# Patient Record
Sex: Female | Born: 1940 | Race: Black or African American | Hispanic: No | Marital: Single | Smoking: Former smoker
Health system: Southern US, Community
[De-identification: ages and names within clinical notes are randomized; demographics above are authoritative.]

## PROBLEM LIST (undated history)

## (undated) DIAGNOSIS — K259 Gastric ulcer, unspecified as acute or chronic, without hemorrhage or perforation: Secondary | ICD-10-CM

## (undated) DIAGNOSIS — J45909 Unspecified asthma, uncomplicated: Secondary | ICD-10-CM

## (undated) DIAGNOSIS — E119 Type 2 diabetes mellitus without complications: Secondary | ICD-10-CM

## (undated) DIAGNOSIS — I10 Essential (primary) hypertension: Secondary | ICD-10-CM

## (undated) DIAGNOSIS — J449 Chronic obstructive pulmonary disease, unspecified: Secondary | ICD-10-CM

## (undated) HISTORY — DX: Essential (primary) hypertension: I10

## (undated) HISTORY — DX: Unspecified asthma, uncomplicated: J45.909

## (undated) HISTORY — DX: Type 2 diabetes mellitus without complications: E11.9

## (undated) HISTORY — DX: Gastric ulcer, unspecified as acute or chronic, without hemorrhage or perforation: K25.9

## (undated) HISTORY — DX: Chronic obstructive pulmonary disease, unspecified: J44.9

---

## 1996-11-30 DIAGNOSIS — I10 Essential (primary) hypertension: Secondary | ICD-10-CM | POA: Insufficient documentation

## 1996-11-30 DIAGNOSIS — J45909 Unspecified asthma, uncomplicated: Secondary | ICD-10-CM | POA: Insufficient documentation

## 2008-11-07 DIAGNOSIS — E119 Type 2 diabetes mellitus without complications: Secondary | ICD-10-CM | POA: Insufficient documentation

## 2010-01-29 DIAGNOSIS — I872 Venous insufficiency (chronic) (peripheral): Secondary | ICD-10-CM | POA: Insufficient documentation

## 2010-11-25 DIAGNOSIS — J9610 Chronic respiratory failure, unspecified whether with hypoxia or hypercapnia: Secondary | ICD-10-CM | POA: Insufficient documentation

## 2011-11-24 ENCOUNTER — Emergency Department: Payer: Self-pay | Admitting: Emergency Medicine

## 2011-11-24 LAB — COMPREHENSIVE METABOLIC PANEL
Albumin: 3.6 g/dL (ref 3.4–5.0)
Alkaline Phosphatase: 49 U/L — ABNORMAL LOW (ref 50–136)
Anion Gap: 5 — ABNORMAL LOW (ref 7–16)
BUN: 14 mg/dL (ref 7–18)
Bilirubin,Total: 0.4 mg/dL (ref 0.2–1.0)
Calcium, Total: 9.3 mg/dL (ref 8.5–10.1)
Chloride: 102 mmol/L (ref 98–107)
Co2: 29 mmol/L (ref 21–32)
Creatinine: 1.35 mg/dL — ABNORMAL HIGH (ref 0.60–1.30)
EGFR (African American): 46 — ABNORMAL LOW
EGFR (Non-African Amer.): 40 — ABNORMAL LOW
Glucose: 185 mg/dL — ABNORMAL HIGH (ref 65–99)
Osmolality: 277 (ref 275–301)
Potassium: 4 mmol/L (ref 3.5–5.1)
SGOT(AST): 31 U/L (ref 15–37)
SGPT (ALT): 21 U/L (ref 12–78)
Sodium: 136 mmol/L (ref 136–145)
Total Protein: 7.3 g/dL (ref 6.4–8.2)

## 2011-11-24 LAB — CBC WITH DIFFERENTIAL/PLATELET
Basophil #: 0.1 10*3/uL (ref 0.0–0.1)
Basophil %: 1.4 %
Eosinophil #: 0.1 10*3/uL (ref 0.0–0.7)
Eosinophil %: 2.4 %
HCT: 37.1 % (ref 35.0–47.0)
HGB: 12.2 g/dL (ref 12.0–16.0)
Lymphocyte #: 1.5 10*3/uL (ref 1.0–3.6)
Lymphocyte %: 26.2 %
MCH: 27.4 pg (ref 26.0–34.0)
MCHC: 32.7 g/dL (ref 32.0–36.0)
MCV: 84 fL (ref 80–100)
Monocyte #: 0.5 x10 3/mm (ref 0.2–0.9)
Monocyte %: 8.1 %
Neutrophil #: 3.5 10*3/uL (ref 1.4–6.5)
Neutrophil %: 61.9 %
Platelet: 298 10*3/uL (ref 150–440)
RBC: 4.45 10*6/uL (ref 3.80–5.20)
RDW: 14.8 % — ABNORMAL HIGH (ref 11.5–14.5)
WBC: 5.7 10*3/uL (ref 3.6–11.0)

## 2011-11-24 LAB — PRO B NATRIURETIC PEPTIDE: B-Type Natriuretic Peptide: 18 pg/mL (ref 0–125)

## 2011-11-24 LAB — TROPONIN I: Troponin-I: <0.02 ng/mL

## 2012-05-04 DIAGNOSIS — K219 Gastro-esophageal reflux disease without esophagitis: Secondary | ICD-10-CM | POA: Insufficient documentation

## 2012-07-05 DIAGNOSIS — J441 Chronic obstructive pulmonary disease with (acute) exacerbation: Secondary | ICD-10-CM | POA: Insufficient documentation

## 2012-07-05 DIAGNOSIS — J449 Chronic obstructive pulmonary disease, unspecified: Secondary | ICD-10-CM | POA: Insufficient documentation

## 2012-10-14 ENCOUNTER — Inpatient Hospital Stay: Payer: Self-pay | Admitting: Internal Medicine

## 2012-10-14 LAB — BASIC METABOLIC PANEL
Anion Gap: 9 (ref 7–16)
BUN: 32 mg/dL — ABNORMAL HIGH (ref 7–18)
Calcium, Total: 8.9 mg/dL (ref 8.5–10.1)
Chloride: 96 mmol/L — ABNORMAL LOW (ref 98–107)
Co2: 21 mmol/L (ref 21–32)
Creatinine: 3.44 mg/dL — ABNORMAL HIGH (ref 0.60–1.30)
EGFR (African American): 15 — ABNORMAL LOW
EGFR (Non-African Amer.): 13 — ABNORMAL LOW
Glucose: 123 mg/dL — ABNORMAL HIGH (ref 65–99)
Osmolality: 262 (ref 275–301)
Potassium: 2.9 mmol/L — ABNORMAL LOW (ref 3.5–5.1)
Sodium: 126 mmol/L — ABNORMAL LOW (ref 136–145)

## 2012-10-14 LAB — URINALYSIS, COMPLETE
Bilirubin,UR: NEGATIVE
Glucose,UR: NEGATIVE mg/dL (ref 0–75)
Hyaline Cast: 8
Ketone: NEGATIVE
Leukocyte Esterase: NEGATIVE
Nitrite: NEGATIVE
Ph: 5 (ref 4.5–8.0)
Protein: 30
RBC,UR: 2 /HPF (ref 0–5)
Specific Gravity: 1.016 (ref 1.003–1.030)
Squamous Epithelial: 17
WBC UR: 8 /HPF (ref 0–5)

## 2012-10-14 LAB — CLOSTRIDIUM DIFFICILE BY PCR

## 2012-10-14 LAB — COMPREHENSIVE METABOLIC PANEL
Albumin: 3.6 g/dL (ref 3.4–5.0)
Alkaline Phosphatase: 53 U/L (ref 50–136)
Anion Gap: 7 (ref 7–16)
BUN: 30 mg/dL — ABNORMAL HIGH (ref 7–18)
Bilirubin,Total: 0.7 mg/dL (ref 0.2–1.0)
Calcium, Total: 9.3 mg/dL (ref 8.5–10.1)
Chloride: 91 mmol/L — ABNORMAL LOW (ref 98–107)
Co2: 25 mmol/L (ref 21–32)
Creatinine: 3.44 mg/dL — ABNORMAL HIGH (ref 0.60–1.30)
EGFR (African American): 15 — ABNORMAL LOW
EGFR (Non-African Amer.): 13 — ABNORMAL LOW
Glucose: 135 mg/dL — ABNORMAL HIGH (ref 65–99)
Osmolality: 256 (ref 275–301)
Potassium: 2.7 mmol/L — ABNORMAL LOW (ref 3.5–5.1)
SGOT(AST): 77 U/L — ABNORMAL HIGH (ref 15–37)
SGPT (ALT): 35 U/L (ref 12–78)
Sodium: 123 mmol/L — ABNORMAL LOW (ref 136–145)
Total Protein: 8.1 g/dL (ref 6.4–8.2)

## 2012-10-14 LAB — CBC
HCT: 42 % (ref 35.0–47.0)
HGB: 14.2 g/dL (ref 12.0–16.0)
MCH: 27.1 pg (ref 26.0–34.0)
MCHC: 33.8 g/dL (ref 32.0–36.0)
MCV: 80 fL (ref 80–100)
Platelet: 249 10*3/uL (ref 150–440)
RBC: 5.22 10*6/uL — ABNORMAL HIGH (ref 3.80–5.20)
RDW: 14.5 % (ref 11.5–14.5)
WBC: 9.6 10*3/uL (ref 3.6–11.0)

## 2012-10-14 LAB — MAGNESIUM: Magnesium: 1.5 mg/dL — ABNORMAL LOW

## 2012-10-14 LAB — LIPASE, BLOOD: Lipase: 185 U/L (ref 73–393)

## 2012-10-14 LAB — TROPONIN I: Troponin-I: <0.02 ng/mL

## 2012-10-14 LAB — OCCULT BLOOD X 1 CARD TO LAB, STOOL: Occult Blood, Feces: POSITIVE

## 2012-10-15 LAB — BASIC METABOLIC PANEL
Anion Gap: 8 (ref 7–16)
BUN: 33 mg/dL — ABNORMAL HIGH (ref 7–18)
Calcium, Total: 8.4 mg/dL — ABNORMAL LOW (ref 8.5–10.1)
Chloride: 98 mmol/L (ref 98–107)
Co2: 22 mmol/L (ref 21–32)
Creatinine: 3.26 mg/dL — ABNORMAL HIGH (ref 0.60–1.30)
EGFR (African American): 16 — ABNORMAL LOW
EGFR (Non-African Amer.): 14 — ABNORMAL LOW
Glucose: 103 mg/dL — ABNORMAL HIGH (ref 65–99)
Osmolality: 265 (ref 275–301)
Potassium: 3 mmol/L — ABNORMAL LOW (ref 3.5–5.1)
Sodium: 128 mmol/L — ABNORMAL LOW (ref 136–145)

## 2012-10-15 LAB — CBC WITH DIFFERENTIAL/PLATELET
Basophil #: 0 10*3/uL (ref 0.0–0.1)
Basophil %: 0.2 %
Eosinophil #: 0 10*3/uL (ref 0.0–0.7)
Eosinophil %: 0.1 %
HCT: 37.5 % (ref 35.0–47.0)
HGB: 12.7 g/dL (ref 12.0–16.0)
Lymphocyte #: 0.8 10*3/uL — ABNORMAL LOW (ref 1.0–3.6)
Lymphocyte %: 9.8 %
MCH: 27 pg (ref 26.0–34.0)
MCHC: 34 g/dL (ref 32.0–36.0)
MCV: 79 fL — ABNORMAL LOW (ref 80–100)
Monocyte #: 0.6 x10 3/mm (ref 0.2–0.9)
Monocyte %: 7.1 %
Neutrophil #: 6.8 10*3/uL — ABNORMAL HIGH (ref 1.4–6.5)
Neutrophil %: 82.8 %
Platelet: 236 10*3/uL (ref 150–440)
RBC: 4.72 10*6/uL (ref 3.80–5.20)
RDW: 14.4 % (ref 11.5–14.5)
WBC: 8.2 10*3/uL (ref 3.6–11.0)

## 2012-10-15 LAB — MAGNESIUM: Magnesium: 2.2 mg/dL

## 2012-10-16 LAB — CBC WITH DIFFERENTIAL/PLATELET
Basophil #: 0 10*3/uL (ref 0.0–0.1)
Basophil %: 0.7 %
Eosinophil #: 0 10*3/uL (ref 0.0–0.7)
Eosinophil %: 0.9 %
HCT: 31.6 % — ABNORMAL LOW (ref 35.0–47.0)
HGB: 11 g/dL — ABNORMAL LOW (ref 12.0–16.0)
Lymphocyte #: 0.9 10*3/uL — ABNORMAL LOW (ref 1.0–3.6)
Lymphocyte %: 18.6 %
MCH: 27.7 pg (ref 26.0–34.0)
MCHC: 34.9 g/dL (ref 32.0–36.0)
MCV: 79 fL — ABNORMAL LOW (ref 80–100)
Monocyte #: 0.7 x10 3/mm (ref 0.2–0.9)
Monocyte %: 13.1 %
Neutrophil #: 3.4 10*3/uL (ref 1.4–6.5)
Neutrophil %: 66.7 %
Platelet: 236 10*3/uL (ref 150–440)
RBC: 3.99 10*6/uL (ref 3.80–5.20)
RDW: 14.5 % (ref 11.5–14.5)
WBC: 5.1 10*3/uL (ref 3.6–11.0)

## 2012-10-16 LAB — BASIC METABOLIC PANEL
Anion Gap: 7 (ref 7–16)
BUN: 20 mg/dL — ABNORMAL HIGH (ref 7–18)
Calcium, Total: 7.8 mg/dL — ABNORMAL LOW (ref 8.5–10.1)
Chloride: 101 mmol/L (ref 98–107)
Co2: 25 mmol/L (ref 21–32)
Creatinine: 2.09 mg/dL — ABNORMAL HIGH (ref 0.60–1.30)
EGFR (African American): 27 — ABNORMAL LOW
EGFR (Non-African Amer.): 23 — ABNORMAL LOW
Glucose: 96 mg/dL (ref 65–99)
Osmolality: 269 (ref 275–301)
Potassium: 3 mmol/L — ABNORMAL LOW (ref 3.5–5.1)
Sodium: 133 mmol/L — ABNORMAL LOW (ref 136–145)

## 2012-10-16 LAB — WBCS, STOOL

## 2012-10-16 LAB — URINE CULTURE

## 2012-10-17 LAB — CBC WITH DIFFERENTIAL/PLATELET
Basophil #: 0 10*3/uL (ref 0.0–0.1)
Basophil %: 0.8 %
Eosinophil #: 0.1 10*3/uL (ref 0.0–0.7)
Eosinophil %: 1.7 %
HCT: 32.3 % — ABNORMAL LOW (ref 35.0–47.0)
HGB: 11 g/dL — ABNORMAL LOW (ref 12.0–16.0)
Lymphocyte #: 1.3 10*3/uL (ref 1.0–3.6)
Lymphocyte %: 30 %
MCH: 27 pg (ref 26.0–34.0)
MCHC: 34.1 g/dL (ref 32.0–36.0)
MCV: 79 fL — ABNORMAL LOW (ref 80–100)
Monocyte #: 0.7 x10 3/mm (ref 0.2–0.9)
Monocyte %: 14.7 %
Neutrophil #: 2.4 10*3/uL (ref 1.4–6.5)
Neutrophil %: 52.8 %
Platelet: 272 10*3/uL (ref 150–440)
RBC: 4.08 10*6/uL (ref 3.80–5.20)
RDW: 15 % — ABNORMAL HIGH (ref 11.5–14.5)
WBC: 4.5 10*3/uL (ref 3.6–11.0)

## 2012-10-17 LAB — BASIC METABOLIC PANEL
Anion Gap: 5 — ABNORMAL LOW (ref 7–16)
BUN: 13 mg/dL (ref 7–18)
Calcium, Total: 7.9 mg/dL — ABNORMAL LOW (ref 8.5–10.1)
Chloride: 105 mmol/L (ref 98–107)
Co2: 26 mmol/L (ref 21–32)
Creatinine: 1.57 mg/dL — ABNORMAL HIGH (ref 0.60–1.30)
EGFR (African American): 38 — ABNORMAL LOW
EGFR (Non-African Amer.): 33 — ABNORMAL LOW
Glucose: 101 mg/dL — ABNORMAL HIGH (ref 65–99)
Osmolality: 272 (ref 275–301)
Potassium: 3.6 mmol/L (ref 3.5–5.1)
Sodium: 136 mmol/L (ref 136–145)

## 2012-10-21 LAB — CULTURE, BLOOD (SINGLE)

## 2012-10-22 LAB — CULTURE, BLOOD (SINGLE)

## 2012-10-29 LAB — CULTURE, BLOOD (SINGLE)

## 2012-10-29 LAB — STOOL CULTURE

## 2013-03-11 ENCOUNTER — Encounter: Payer: Self-pay | Admitting: Podiatry

## 2013-03-14 ENCOUNTER — Ambulatory Visit (INDEPENDENT_AMBULATORY_CARE_PROVIDER_SITE_OTHER): Payer: Medicare Other | Admitting: Podiatry

## 2013-03-14 ENCOUNTER — Encounter: Payer: Self-pay | Admitting: Podiatry

## 2013-03-14 VITALS — BP 144/81 | HR 86 | Resp 16 | Ht 64.0 in | Wt 278.0 lb

## 2013-03-14 DIAGNOSIS — B351 Tinea unguium: Secondary | ICD-10-CM

## 2013-03-14 DIAGNOSIS — M79609 Pain in unspecified limb: Secondary | ICD-10-CM

## 2013-03-14 NOTE — Progress Notes (Signed)
She presents today with a chief complaint of painful elongated toenails.  Objective: Pulses are palpable. Vital signs are stable she is alert and oriented x3. Nails are thick yellow dystrophic clinically mycotic and severely elongated. She also has reactive porokeratotic lesion to the plantar aspect of the right foot and distal clavi. Her toenails are thick yellow dystrophic clinically mycotic and painful on palpation as well as R. her calluses.  Assessment: Pain in limb secondary to onychomycosis and distal clavi.  Plan: Debridement of nails 1 through 5 bilateral covered service secondary to pain and debridement of reactive hyperkeratosis.

## 2013-03-20 DIAGNOSIS — Z86010 Personal history of colonic polyps: Secondary | ICD-10-CM | POA: Insufficient documentation

## 2013-06-13 ENCOUNTER — Ambulatory Visit (INDEPENDENT_AMBULATORY_CARE_PROVIDER_SITE_OTHER): Payer: Medicare Other | Admitting: Podiatry

## 2013-06-13 ENCOUNTER — Encounter: Payer: Self-pay | Admitting: Podiatry

## 2013-06-13 VITALS — BP 149/73 | HR 97 | Resp 20

## 2013-06-13 DIAGNOSIS — M79609 Pain in unspecified limb: Secondary | ICD-10-CM

## 2013-06-13 DIAGNOSIS — B351 Tinea unguium: Secondary | ICD-10-CM

## 2013-06-13 NOTE — Progress Notes (Signed)
She presents today with a chief complaint of painful elongated toenails one through 5 bilateral.  Objective evaluation reveals of systems alert and oriented x3 pulses are palpable. Nails are thick yellow dystrophic lytic mycotic and painful palpation.  Assessment: Pain in limb secondary to onychomycosis 1-5 bilateral.  Plan: Debridement of nails 1 through 5 bilateral covered service secondary to pain.

## 2013-09-07 DIAGNOSIS — M8580 Other specified disorders of bone density and structure, unspecified site: Secondary | ICD-10-CM | POA: Insufficient documentation

## 2013-09-19 ENCOUNTER — Encounter: Payer: Self-pay | Admitting: Podiatry

## 2013-09-19 ENCOUNTER — Ambulatory Visit (INDEPENDENT_AMBULATORY_CARE_PROVIDER_SITE_OTHER): Payer: Medicare Other | Admitting: Podiatry

## 2013-09-19 VITALS — BP 140/95 | HR 86 | Resp 16

## 2013-09-19 DIAGNOSIS — B351 Tinea unguium: Secondary | ICD-10-CM

## 2013-09-19 DIAGNOSIS — E119 Type 2 diabetes mellitus without complications: Secondary | ICD-10-CM

## 2013-09-19 DIAGNOSIS — M79609 Pain in unspecified limb: Secondary | ICD-10-CM

## 2013-09-19 DIAGNOSIS — Q828 Other specified congenital malformations of skin: Secondary | ICD-10-CM

## 2013-09-19 DIAGNOSIS — M79676 Pain in unspecified toe(s): Secondary | ICD-10-CM

## 2013-09-19 NOTE — Progress Notes (Signed)
She presents today with a chief complaint of painful elongated toenails and calluses plantar aspect of the bilateral foot.  Objective: Pulses are strongly palpable bilateral. Neurologic sensorium is intact per since once the monofilament. Muscle strength is 5 over 5 dorsiflexors plantar flexors inverters everters all intrinsic musculature is intact. Nails are thick yellow dystrophic with mycotic with porokeratosis his bilateral.  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  Plan: Debridement of nails 1 through 5 bilateral covered service secondary to pain and debridement of painful benign soft tissue lesions/porokeratosis bilateral.

## 2013-09-19 NOTE — Patient Instructions (Signed)
Diabetes and Foot Care Diabetes may cause you to have problems because of poor blood supply (circulation) to your feet and legs. This may cause the skin on your feet to become thinner, break easier, and heal more slowly. Your skin may become dry, and the skin may peel and crack. You may also have nerve damage in your legs and feet causing decreased feeling in them. You may not notice minor injuries to your feet that could lead to infections or more serious problems. Taking care of your feet is one of the most important things you can do for yourself.  HOME CARE INSTRUCTIONS  Wear shoes at all times, even in the house. Do not go barefoot. Bare feet are easily injured.  Check your feet daily for blisters, cuts, and redness. If you cannot see the bottom of your feet, use a mirror or ask someone for help.  Wash your feet with warm water (do not use hot water) and mild soap. Then pat your feet and the areas between your toes until they are completely dry. Do not soak your feet as this can dry your skin.  Apply a moisturizing lotion or petroleum jelly (that does not contain alcohol and is unscented) to the skin on your feet and to dry, brittle toenails. Do not apply lotion between your toes.  Trim your toenails straight across. Do not dig under them or around the cuticle. File the edges of your nails with an emery board or nail file.  Do not cut corns or calluses or try to remove them with medicine.  Wear clean socks or stockings every day. Make sure they are not too tight. Do not wear knee-high stockings since they may decrease blood flow to your legs.  Wear shoes that fit properly and have enough cushioning. To break in new shoes, wear them for just a few hours a day. This prevents you from injuring your feet. Always look in your shoes before you put them on to be sure there are no objects inside.  Do not cross your legs. This may decrease the blood flow to your feet.  If you find a minor scrape,  cut, or break in the skin on your feet, keep it and the skin around it clean and dry. These areas may be cleansed with mild soap and water. Do not cleanse the area with peroxide, alcohol, or iodine.  When you remove an adhesive bandage, be sure not to damage the skin around it.  If you have a wound, look at it several times a day to make sure it is healing.  Do not use heating pads or hot water bottles. They may burn your skin. If you have lost feeling in your feet or legs, you may not know it is happening until it is too late.  Make sure your health care provider performs a complete foot exam at least annually or more often if you have foot problems. Report any cuts, sores, or bruises to your health care provider immediately. SEEK MEDICAL CARE IF:   You have an injury that is not healing.  You have cuts or breaks in the skin.  You have an ingrown nail.  You notice redness on your legs or feet.  You feel burning or tingling in your legs or feet.  You have pain or cramps in your legs and feet.  Your legs or feet are numb.  Your feet always feel cold. SEEK IMMEDIATE MEDICAL CARE IF:   There is increasing redness,   swelling, or pain in or around a wound.  There is a red line that goes up your leg.  Pus is coming from a wound.  You develop a fever or as directed by your health care provider.  You notice a bad smell coming from an ulcer or wound. Document Released: 02/29/2000 Document Revised: 11/03/2012 Document Reviewed: 08/10/2012 [employer] Patient Information 2015 [employer]. This information is not intended to replace advice given to you by your health care provider. Make sure you discuss any questions you have with your health care provider.  

## 2013-12-26 ENCOUNTER — Ambulatory Visit (INDEPENDENT_AMBULATORY_CARE_PROVIDER_SITE_OTHER): Payer: Medicare Other | Admitting: Podiatry

## 2013-12-26 DIAGNOSIS — B351 Tinea unguium: Secondary | ICD-10-CM

## 2013-12-26 DIAGNOSIS — M79676 Pain in unspecified toe(s): Secondary | ICD-10-CM

## 2013-12-26 NOTE — Progress Notes (Signed)
Presents today chief complaint of painful elongated toenails.  Objective: Pulses are palpable bilateral nails are thick, yellow dystrophic onychomycosis and painful palpation.   Assessment: Onychomycosis with pain in limb.  Plan: Treatment of nails in thickness and length as covered service secondary to pain.  

## 2014-03-27 ENCOUNTER — Ambulatory Visit: Payer: Medicare Other | Admitting: Podiatry

## 2014-03-27 ENCOUNTER — Ambulatory Visit (INDEPENDENT_AMBULATORY_CARE_PROVIDER_SITE_OTHER): Payer: Medicare Other | Admitting: Podiatry

## 2014-03-27 DIAGNOSIS — M79676 Pain in unspecified toe(s): Secondary | ICD-10-CM

## 2014-03-27 DIAGNOSIS — B351 Tinea unguium: Secondary | ICD-10-CM

## 2014-03-27 NOTE — Progress Notes (Signed)
She presents today with a chief complaint of painful elongated toenails and calluses plantar aspect of the bilateral foot.  Objective: Pulses are strongly palpable bilateral. Neurologic sensorium is intact per since once the monofilament. Muscle strength is 5 over 5 dorsiflexors plantar flexors inverters everters all intrinsic musculature is intact. Nails are thick yellow dystrophic with mycotic with porokeratosis his bilateral.  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  Plan: Debridement of nails 1 through 5 bilateral covered service secondary to pain and debridement of painful benign soft tissue lesions/porokeratosis bilateral. 

## 2014-06-26 ENCOUNTER — Ambulatory Visit: Payer: Medicare Other

## 2014-07-06 ENCOUNTER — Encounter: Payer: Self-pay | Admitting: Podiatry

## 2014-07-06 ENCOUNTER — Ambulatory Visit (INDEPENDENT_AMBULATORY_CARE_PROVIDER_SITE_OTHER): Payer: Medicare Other

## 2014-07-06 ENCOUNTER — Ambulatory Visit (INDEPENDENT_AMBULATORY_CARE_PROVIDER_SITE_OTHER): Payer: Medicare Other | Admitting: Podiatry

## 2014-07-06 DIAGNOSIS — M79676 Pain in unspecified toe(s): Secondary | ICD-10-CM | POA: Diagnosis not present

## 2014-07-06 DIAGNOSIS — B351 Tinea unguium: Secondary | ICD-10-CM

## 2014-07-06 DIAGNOSIS — M722 Plantar fascial fibromatosis: Secondary | ICD-10-CM

## 2014-07-06 NOTE — Patient Instructions (Signed)
  Diabetes and Foot Care Diabetes may cause you to have problems because of poor blood supply (circulation) to your feet and legs. This may cause the skin on your feet to become thinner, break easier, and heal more slowly. Your skin may become dry, and the skin may peel and crack. You may also have nerve damage in your legs and feet causing decreased feeling in them. You may not notice minor injuries to your feet that could lead to infections or more serious problems. Taking care of your feet is one of the most important things you can do for yourself.  HOME CARE INSTRUCTIONS  Wear shoes at all times, even in the house. Do not go barefoot. Bare feet are easily injured.  Check your feet daily for blisters, cuts, and redness. If you cannot see the bottom of your feet, use a mirror or ask someone for help.  Wash your feet with warm water (do not use hot water) and mild soap. Then pat your feet and the areas between your toes until they are completely dry. Do not soak your feet as this can dry your skin.  Apply a moisturizing lotion or petroleum jelly (that does not contain alcohol and is unscented) to the skin on your feet and to dry, brittle toenails. Do not apply lotion between your toes.  Trim your toenails straight across. Do not dig under them or around the cuticle. File the edges of your nails with an emery board or nail file.  Do not cut corns or calluses or try to remove them with medicine.  Wear clean socks or stockings every day. Make sure they are not too tight. Do not wear knee-high stockings since they may decrease blood flow to your legs.  Wear shoes that fit properly and have enough cushioning. To break in new shoes, wear them for just a few hours a day. This prevents you from injuring your feet. Always look in your shoes before you put them on to be sure there are no objects inside.  Do not cross your legs. This may decrease the blood flow to your feet.  If you find a minor  scrape, cut, or break in the skin on your feet, keep it and the skin around it clean and dry. These areas may be cleansed with mild soap and water. Do not cleanse the area with peroxide, alcohol, or iodine.  When you remove an adhesive bandage, be sure not to damage the skin around it.  If you have a wound, look at it several times a day to make sure it is healing.  Do not use heating pads or hot water bottles. They may burn your skin. If you have lost feeling in your feet or legs, you may not know it is happening until it is too late.  Make sure your health care provider performs a complete foot exam at least annually or more often if you have foot problems. Report any cuts, sores, or bruises to your health care provider immediately. SEEK MEDICAL CARE IF:   You have an injury that is not healing.  You have cuts or breaks in the skin.  You have an ingrown nail.  You notice redness on your legs or feet.  You feel burning or tingling in your legs or feet.  You have pain or cramps in your legs and feet.  Your legs or feet are numb.  Your feet always feel cold. SEEK IMMEDIATE MEDICAL CARE IF:   There is   increasing redness, swelling, or pain in or around a wound.  There is a red line that goes up your leg.  Pus is coming from a wound.  You develop a fever or as directed by your health care provider.  You notice a bad smell coming from an ulcer or wound. Document Released: 02/29/2000 Document Revised: 11/03/2012 Document Reviewed: 08/10/2012 [employer] Patient Information 2015 [employer]. This information is not intended to replace advice given to you by your health care provider. Make sure you discuss any questions you have with your health care provider. Plantar Fasciitis (Heel Spur Syndrome) with Rehab The plantar fascia is a fibrous, ligament-like, soft-tissue structure that spans the bottom of the foot. Plantar fasciitis is a condition that causes pain in the foot due to  inflammation of the tissue. SYMPTOMS   Pain and tenderness on the underneath side of the foot.  Pain that worsens with standing or walking. CAUSES  Plantar fasciitis is caused by irritation and injury to the plantar fascia on the underneath side of the foot. Common mechanisms of injury include:  Direct trauma to bottom of the foot.  Damage to a small nerve that runs under the foot where the main fascia attaches to the heel bone.  Stress placed on the plantar fascia due to bone spurs. RISK INCREASES WITH:   Activities that place stress on the plantar fascia (running, jumping, pivoting, or cutting).  Poor strength and flexibility.  Improperly fitted shoes.  Tight calf muscles.  Flat feet.  Failure to warm-up properly before activity.  Obesity. PREVENTION  Warm up and stretch properly before activity.  Allow for adequate recovery between workouts.  Maintain physical fitness:  Strength, flexibility, and endurance.  Cardiovascular fitness.  Maintain a health body weight.  Avoid stress on the plantar fascia.  Wear properly fitted shoes, including arch supports for individuals who have flat feet. PROGNOSIS  If treated properly, then the symptoms of plantar fasciitis usually resolve without surgery. However, occasionally surgery is necessary. RELATED COMPLICATIONS   Recurrent symptoms that may result in a chronic condition.  Problems of the lower back that are caused by compensating for the injury, such as limping.  Pain or weakness of the foot during push-off following surgery.  Chronic inflammation, scarring, and partial or complete fascia tear, occurring more often from repeated injections. TREATMENT  Treatment initially involves the use of ice and medication to help reduce pain and inflammation. The use of strengthening and stretching exercises may help reduce pain with activity, especially stretches of the Achilles tendon. These exercises may be performed at  home or with a therapist. Your caregiver may recommend that you use heel cups of arch supports to help reduce stress on the plantar fascia. Occasionally, corticosteroid injections are given to reduce inflammation. If symptoms persist for greater than 6 months despite non-surgical (conservative), then surgery may be recommended.  MEDICATION   If pain medication is necessary, then nonsteroidal anti-inflammatory medications, such as aspirin and ibuprofen, or other minor pain relievers, such as acetaminophen, are often recommended.  Do not take pain medication within 7 days before surgery.  Prescription pain relievers may be given if deemed necessary by your caregiver. Use only as directed and only as much as you need.  Corticosteroid injections may be given by your caregiver. These injections should be reserved for the most serious cases, because they may only be given a certain number of times. HEAT AND COLD  Cold treatment (icing) relieves pain and reduces inflammation. Cold treatment should   be applied for 10 to 15 minutes every 2 to 3 hours for inflammation and pain and immediately after any activity that aggravates your symptoms. Use ice packs or massage the area with a piece of ice (ice massage).  Heat treatment may be used prior to performing the stretching and strengthening activities prescribed by your caregiver, physical therapist, or athletic trainer. Use a heat pack or soak the injury in warm water. SEEK IMMEDIATE MEDICAL CARE IF:  Treatment seems to offer no benefit, or the condition worsens.  Any medications produce adverse side effects. EXERCISES RANGE OF MOTION (ROM) AND STRETCHING EXERCISES - Plantar Fasciitis (Heel Spur Syndrome) These exercises may help you when beginning to rehabilitate your injury. Your symptoms may resolve with or without further involvement from your physician, physical therapist or athletic trainer. While completing these exercises, remember:   Restoring  tissue flexibility helps normal motion to return to the joints. This allows healthier, less painful movement and activity.  An effective stretch should be held for at least 30 seconds.  A stretch should never be painful. You should only feel a gentle lengthening or release in the stretched tissue. RANGE OF MOTION - Toe Extension, Flexion  Sit with your right / left leg crossed over your opposite knee.  Grasp your toes and gently pull them back toward the top of your foot. You should feel a stretch on the bottom of your toes and/or foot.  Hold this stretch for __________ seconds.  Now, gently pull your toes toward the bottom of your foot. You should feel a stretch on the top of your toes and or foot.  Hold this stretch for __________ seconds. Repeat __________ times. Complete this stretch __________ times per day.  RANGE OF MOTION - Ankle Dorsiflexion, Active Assisted  Remove shoes and sit on a chair that is preferably not on a carpeted surface.  Place right / left foot under knee. Extend your opposite leg for support.  Keeping your heel down, slide your right / left foot back toward the chair until you feel a stretch at your ankle or calf. If you do not feel a stretch, slide your bottom forward to the edge of the chair, while still keeping your heel down.  Hold this stretch for __________ seconds. Repeat __________ times. Complete this stretch __________ times per day.  STRETCH - Gastroc, Standing  Place hands on wall.  Extend right / left leg, keeping the front knee somewhat bent.  Slightly point your toes inward on your back foot.  Keeping your right / left heel on the floor and your knee straight, shift your weight toward the wall, not allowing your back to arch.  You should feel a gentle stretch in the right / left calf. Hold this position for __________ seconds. Repeat __________ times. Complete this stretch __________ times per day. STRETCH - Soleus, Standing  Place  hands on wall.  Extend right / left leg, keeping the other knee somewhat bent.  Slightly point your toes inward on your back foot.  Keep your right / left heel on the floor, bend your back knee, and slightly shift your weight over the back leg so that you feel a gentle stretch deep in your back calf.  Hold this position for __________ seconds. Repeat __________ times. Complete this stretch __________ times per day. STRETCH - Gastrocsoleus, Standing  Note: This exercise can place a lot of stress on your foot and ankle. Please complete this exercise only if specifically instructed by your caregiver.     Place the ball of your right / left foot on a step, keeping your other foot firmly on the same step.  Hold on to the wall or a rail for balance.  Slowly lift your other foot, allowing your body weight to press your heel down over the edge of the step.  You should feel a stretch in your right / left calf.  Hold this position for __________ seconds.  Repeat this exercise with a slight bend in your right / left knee. Repeat __________ times. Complete this stretch __________ times per day.  STRENGTHENING EXERCISES - Plantar Fasciitis (Heel Spur Syndrome)  These exercises may help you when beginning to rehabilitate your injury. They may resolve your symptoms with or without further involvement from your physician, physical therapist or athletic trainer. While completing these exercises, remember:   Muscles can gain both the endurance and the strength needed for everyday activities through controlled exercises.  Complete these exercises as instructed by your physician, physical therapist or athletic trainer. Progress the resistance and repetitions only as guided. STRENGTH - Towel Curls  Sit in a chair positioned on a non-carpeted surface.  Place your foot on a towel, keeping your heel on the floor.  Pull the towel toward your heel by only curling your toes. Keep your heel on the  floor.  If instructed by your physician, physical therapist or athletic trainer, add ____________________ at the end of the towel. Repeat __________ times. Complete this exercise __________ times per day. STRENGTH - Ankle Inversion  Secure one end of a rubber exercise band/tubing to a fixed object (table, pole). Loop the other end around your foot just before your toes.  Place your fists between your knees. This will focus your strengthening at your ankle.  Slowly, pull your big toe up and in, making sure the band/tubing is positioned to resist the entire motion.  Hold this position for __________ seconds.  Have your muscles resist the band/tubing as it slowly pulls your foot back to the starting position. Repeat __________ times. Complete this exercises __________ times per day.  Document Released: 03/03/2005 Document Revised: 05/26/2011 Document Reviewed: 06/15/2008 [employer] Patient Information 2015 [employer]. This information is not intended to replace advice given to you by your health care provider. Make sure you discuss any questions you have with your health care provider.  

## 2014-07-07 NOTE — Discharge Summary (Signed)
PATIENT NAME:  Kelly Hogan, CAROLYN F MR#:  [id number] DATE OF BIRTH:  12/04/1940  DATE OF ADMISSION:  10/14/2012 DATE OF DISCHARGE:  10/17/2012  DISCHARGE DIAGNOSES: 1.  Campylobacter jejuni enteritis.   2.  Salmonella bacteremia.  3.  Acute renal failure due to dehydration.  4.  Electrolyte imbalance due to dehydration and renal failure.  5.  Chronic obstructive pulmonary disease, chronic respiratory failure, on home oxygen.  6.  Diabetes.  7.  Hypertension.  8.  Hyperlipidemia.   CONDITION ON DISCHARGE: Stable.   CODE STATUS: Full code.   MEDICATIONS ON DISCHARGE: 1.  Glipizide 2.5 mg once a day.  2.  Advair 1 puff 2 times a day.  3.  Spiriva 1 puff once a day.  4.  Aspirin 81 mg once a day.  5.  Vitamin D3 3000 international units once a day.  6.  Simvastatin 10 mg oral tablet once a day.  7.  Omeprazole 20 mg once a day.  8.  Clonidine 0.1 mg 2 times a day.  9. Ciprofloxacin 500 mg every 12 hours for 8 days. 10.   Oxygen on discharge: Yes;  4 liters at home.   DIET: Low sodium, carbohydrate -controlled ADA diet, regular consistency.   ACTIVITY: As tolerated.  FOLLOWUP:  Advised to follow within 1 to 2 weeks to check kidney function and follow with Dr. Daleen SquibbWall for complaint of diarrhea.   HISTORY OF PRESENT ILLNESS: The patient is a 74 year old female who had diarrhea for the last 3 to 4 days, all  day and night, and she cannot rest at all because of that. She also noted nausea and vomiting for 5 to 6 times. There was no blood in the vomit. No blood in the stool. She was having abdominal pain, burning like pain and reflux, which was on and off and 8 out of 10 intensity. Denied any recent antibiotic use or travel history.  In the ER, she was also found having renal failure with creatinine of 3.44. Her baseline was 1.35 in September 2013, and severe hyponatremia with sodium level of 123, potassium of 2.7   HOSPITAL COURSE AND STAY:  1.  For her diarrhea and vomiting. Initially, it  was suspected due to acute gastroenteritis and she was started on Cipro and Flagyl IV and she had significant improvement in her diarrhea, her vomiting resolved and she was able to tolerate diet properly with the next two days. Her stool studies reported positive for Campylobacter jejuni Antigen and so we discharged her on ciprofloxacin orally. Her blood culture on admission also reported positive for Salmonella and which was sent to state laboratory by microbiology lab for serotyping and further management. For this, I spoke to Dr. Sampson GoonFitzgerald (infectious disease specialist) on phone about further needed things to do for Salmonella in the blood and he suggested to finish oral ciprofloxacin for 10 days and let her follow in the clinic. We started the patient accordingly and discharged home.  2.  Acute renal failure due to dehydration secondary to gastroenteritis. We held her enalapril and gave IV fluids and continued to monitor urine output, and her renal function was gradually improving and came to satisfactory level in next 3 days.  3.  Severe electrolyte imbalance due to dehydration, hyponatremia and hypokalemia was present on admission, corrected.  4.  Chronic respiratory failure due to chronic obstructive pulmonary disease. We continued oxygen supplementation. There was no exacerbation of wheezing while in the hospital.  5.  Occult blood positive in  the stool. CBC was followed. Hemoglobin remained stable, and GI consult was called in. They suggested to follow in the clinic later on for this issue. CT of the abdomen was done, which was negative for any gross malignancy.  6.  History of hypertension. We consolidated all the medication and stopped enalapril due to renal failure and dehydration on admission.  7.  Diabetes. Continue sliding scale coverage.  8.  Hyperlipidemia. We continued statin.    IMPORTANT LABORATORY RESULTS: Creatinine was 3.44 on presentation. Sodium was 123 and potassium was 2.7. WBC  count 9600, hemoglobin was 14.2, magnesium 1.5. Blood culture was positive for probable Salmonella species. Repeat blood culture on July 31st was negative. Stool for C. difficile was negative. Urine culture was mixed bacterial organism, with history of contamination. Stool culture reported probable Salmonella group. CT scan of the abdomen and pelvis without contrast: Finding may represent sequelae of urinary bladder calculus on the left. No evidence of obstructive or inflammatory abnormality.  No evidence of bowel obstruction, enteritis, colitis or diverticulitis. Creatinine level improved to 2.09 on 2nd of August and finally 1.57 on 3rd of August with complete recovery of sodium and potassium level. Blood cultures which were repeated on 3rd of August were negative.   Total time spent on this discharge: 45 minutes   ____________________________ Kelly Hogan. Kelly Pigeon, MD vgv:[id number] D: 10/19/2012 22:43:57 ET T: 10/20/2012 05:41:20 ET JOB#: [id number]  cc: Kelly Hogan. Kelly Pigeon, MD, <Dictator> Kelly Dilling MD ELECTRONICALLY SIGNED 11/02/2012 11:13

## 2014-07-07 NOTE — Consult Note (Signed)
PATIENT NAME:  Kelly Hogan, Kelly Hogan MR#:  [id number] DATE OF BIRTH:  14-Jul-1940  DATE OF CONSULTATION:  10/15/2012  REQUESTING PHYSICIAN:  Dr. Anselm Jungling.  GASTROENTEROLOGIST: Dr. Allen Norris  CONSULTING PHYSICIAN:  Andria Meuse, NP  REASON FOR CONSULTATION: Intractable diarrhea, nausea and vomiting and Hemoccult positive stool.   PRIMARY CARE PHYSICIAN:  Dr. Thomes Lolling at [facility].  HISTORY OF PRESENT ILLNESS:  Kelly Hogan is a pleasant 74 year old black female who was in her usual state of health until 5 days ago, when she developed profuse watery diarrhea, nausea and vomiting. She has had too numerous to count episodes of above with episodes of incontinence. She tells me she is having mucousy yellow stools all day long. She also complains of epigastric pain that she rates 6 out of 10 on pain scale and the pain is constant. Nothing seems to make it better. She was found to have Hemoccult positive stool. She had a C. difficile and stool culture, which is negative in the past 12 hours. She has not seen any rectal bleeding or melena, however. She reports low-grade fever and chills. She reports intractable indigestion despite taking omeprazole 20 mg daily at home. She denies any history of GI problems including peptic ulcer disease or inflammatory bowel disease. She does report ill contacts. About 2 to 3 weeks ago, her niece and her son both had similar symptoms. She reports her last colonoscopy was in 1999 and was normal done elsewhere. She was found to have acute renal failure upon admission. She does not believe she has every had an  EGD. She had three-way abdomen films, which were negative.   PAST MEDICAL HISTORY: COPD on home oxygen, arthritis, diabetes mellitus, hypertension, vitamin D deficiency.  PAST SURGICAL HISTORY:  Denies any.    MEDICATIONS  PRIOR TO ADMISSION:  Advair Diskus 250/50 mcg b.i.d., aspirin 81 mg daily, clonidine 0.1 mg t.i.d., enalapril 20 mg b.i.d., glipizide 2.5 mg daily,  omeprazole 20 mg daily, simvastatin 10 mg at bedtime, Spiriva 1 puff daily, vitamin D3 1000 international units daily.   ALLERGIES: PENICILLIN  (SWELLING), ZANTAC (SWELLING).  FAMILY HISTORY: There is no family history of carcinoma, liver or chronic GI problems. Father deceased at 64 secondary to CVA with history of hypertension. Mother deceased at 39 with CVA,  also had hypertension.   SOCIAL HISTORY: She lives alone. She quit smoking 5 years ago. Denies any current tobacco, alcohol or illicit drug use. She is a retired Engineer, manufacturing systems. Son lives nearby.   REVIEW OF SYSTEMS:  See HPI, otherwise negative 12-point review of systems.   PHYSICAL EXAMINATION: VITAL SIGNS: Temperature 98.9, pulse 89, respirations 20, blood pressure 114/71, oxygen saturation 100% and 4 L/min.  GENERAL: She is obese black female who is alert, oriented, pleasant and cooperative.  In no acute distress.  HEENT: Sclerae clear, anicteric. Conjunctivae pink.  Oropharynx pink and moist without any lesions.  NECK: Supple without mass or thyromegaly.  CHEST: Heart regular rate and rhythm. Normal S1, S2. No murmurs, clicks, rubs or gallops.  LUNGS: Clear to auscultation bilaterally.  ABDOMEN: Protuberant. She has positive bowel sounds x4. There is no bruits auscultated. Abdomen is soft, moderately distended. She does have epigastric tenderness and tenderness around the umbilicus on deep palpation. There is no rebound, tenderness or guarding. No hepatosplenomegaly or mass, although exam is limiting due to the patient's body habitus.  EXTREMITIES:  Without clubbing. She does have 1+ pretibial edema bilaterally.  SKIN: Brown, warm and dry without any rash or  jaundice.  NEUROLOGIC:  Grossly intact.  PSYCHIATRIC: With normal mood and affect.  MUSCULOSKELETAL: She has good equal strength and movement bilaterally.   LABORATORY STUDIES: She had a glucose 103, BUN 33, creatinine 3.26, sodium 128, potassium 3, calcium 8.4, otherwise  normal met-7. Magnesium is normal. AST is 77, otherwise normal LFTs, CBC was normal.   IMPRESSION: [name] is a pleasant 74 year old black female with a 5 day history of intractable nausea, vomiting, diarrhea and Hemoccult-positive stool. No recent medications,  antibiotics for foreign travel. She had a normal 3-way abdomen, negative Clostridium difficile and culture this far. She also has severe upper abdominal pain and indigestion.  Differentials include refractory gastroenteritis, Norovirus, colitis, inflammatory bowel disease, gastrointestinal malignancy or pancreatic malignancy. CT scan of the abdomen and pelvis without contrast given acute renal failure. She will also need an EGD and colonoscopy for further evaluation in the near future given her Hemoccult positive stool and severe upper abdominal pain. Will increase proton pump inhibitor to b.i.d.    PLAN:   1.  CT scan of the abdomen and pelvis without contrast.  2.  EGD and colonoscopy in the near future. I have discussed risks and benefits which include but not limited to bleeding, infection, perforation, drug reaction. She agrees with plan.  Consent obtained.  3.  Increase PPI to b.i.d.  4.  Continue supportive measures.  5.  Potassium repletion per attending.  6.  Agree with empiric Cipro and Flagyl.    We would like to thank you for allowing [place] to percent the care of [name].    ____________________________ Andria Meuse, NP klj:dp D: 10/15/2012 12:21:05 ET T: 10/15/2012 13:17:10 ET JOB#: [id number]  cc: Andria Meuse, NP, <Dictator> Dr. Thomes Lolling at [place] SIGNED 10/22/2012 9:05

## 2014-07-07 NOTE — Consult Note (Signed)
Brief Consult Note: Diagnosis: intractable N/V/D.   Patient was seen by consultant.   Consult note dictated.   Comments: Ms. Kelly Hogan is a pleasant 74 y/o female with 5-day hx of intractable N/V/D & hemoccult positive stool.  Normal 3-way ABD, negative c diff, & culture thus far.  She also has severe upper abdominal pain & indigestion.  Differentials include refractory gastroenteritis, Norovirus, colitis, IBD, GI malignancy, or pancreatic malignancy.  CT scan abd/pelvis with contrast given acute renal failure.  She will need EGD/colonoscopy for further evaluation in near future.  Increase PPI to BID.  Continue supportive measures.  K repletion per attending.  Agree with empiric cipro/flagyl.    Thanks for consult.  Please see full dictated note.  #[id number].  Electronic Signatures: Joselyn ArrowJones, Shown Dissinger L (NP)  (Signed 01-Aug-14 12:15)  Authored: Brief Consult Note   Last Updated: 01-Aug-14 12:15 by Joselyn ArrowJones, Mahala Rommel L (NP)

## 2014-07-07 NOTE — H&P (Signed)
PATIENT NAME:  Kelly Hogan, Kelly Hogan MR#:  [id number] DATE OF BIRTH:  1941-01-12  DATE OF ADMISSION:  10/14/2012  PRIMARY CARE PHYSICIAN: De Nurse A. Madelon Lips, MD, in [place].  CHIEF COMPLAINT: Diarrhea.   HISTORY OF PRESENT ILLNESS: This is a 74 year old female who has diarrhea since Sunday too many times to count, day and night. She cannot rest. She has also had nausea and vomiting 5 to 6 times. No blood in the vomitus. No blood in the bowel movements. She is having abdominal pain in the top of her stomach, burning-like, acid reflux that comes and goes, can be as intense as 8 out of 10 in intensity. No recent antibiotic use or travel out of the country. In the ER, she was found to be in acute renal failure with a creatinine of 3.44, baseline of 1.35 back in September 2013, severely hyponatremic with a sodium of 123, a potassium of 2.7. I added on a magnesium, and that was 1.5. Hospitalist services were contacted for further evaluation.   PAST MEDICAL HISTORY:  1. Chronic respiratory failure with COPD, on home oxygen.  2. Arthritis.  3. Diabetes.  4. Hypertension.  5. Vitamin D deficiency.   PAST SURGICAL HISTORY: None.   ALLERGIES: No known drug allergies.   MEDICATIONS: Include:  1. Advair 250/50 one inhalation twice a day. 2. Spiriva 1 inhalation daily.  3. Omeprazole 20 mg daily.  4. Zocor 10 mg q.h.s.  5. Glipizide 2.5 mg daily.  6. Enalapril 20 mg b.i.d. 7. Vitamin D3 1000 international units daily.  8. Clonidine 0.1 mg t.i.d. 9. Aspirin 81 mg daily 10. Albuterol p.r.n.   SOCIAL HISTORY: Quit smoking 5 years ago. No alcohol. No drug use. 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 Lives alone.   FAMILY HISTORY: Father died at 32 of a CVA, had hypertension. Mother died at 82 of CVA and was paralyzed for 21 years prior to dying, also had hypertension.   REVIEW OF SYSTEMS:  CONSTITUTIONAL: Positive for fever. Positive for chills. Positive for sweats. Positive for weakness.  EYES: Does wear  glasses.  EARS, NOSE, MOUTH AND THROAT: Occasional dysphagia to liquids and solids.  CARDIOVASCULAR: No chest pain. No palpitations.  RESPIRATORY: Positive for cough. No shortness of breath. No sputum. No hemoptysis.  GASTROINTESTINAL: Positive for nausea. Positive for vomiting. Positive for abdominal pain. Positive for diarrhea. No bright red blood per rectum. No melena. No hematemesis.  GENITOURINARY: Positive for burning on urination.  MUSCULOSKELETAL: Positive for arthritis pain.  INTEGUMENT: No rashes or eruptions.  NEUROLOGICAL: No fainting or blackouts.  PSYCHIATRIC: No anxiety or depression.  ENDOCRINE: No thyroid problems.   PHYSICAL EXAMINATION:  VITAL SIGNS: Temperature 99, pulse 109, respirations 18, blood pressure 109/64, pulse oximetry 98%.  GENERAL: No respiratory distress.  EYES: Conjunctivae and lids normal. Pupils equal, round and reactive to light. Extraocular muscles intact. No nystagmus.  EARS, NOSE, MOUTH AND THROAT: Tympanic membranes: No erythema. Nasal mucosa: No erythema. Throat: No erythema, no exudate seen. Lips and gums: No lesions.  NECK: No JVD. No bruits. No lymphadenopathy. No thyromegaly. No thyroid nodules palpated.  RESPIRATORY: Lungs clear to auscultation. No use of accessory muscles to breathe. No rhonchi, rales or wheeze heard.  CARDIOVASCULAR: S1, S2 normal. No gallops, rubs or murmurs heard. Carotid upstroke 2+ bilaterally. No bruits. Dorsalis pedis pulses 2+ bilaterally. Edema 1+ of the lower extremity. ABDOMEN: Soft. Slight tenderness in the epigastric area. No organomegaly or splenomegaly. Normoactive bowel sounds. No masses felt.  LYMPHATIC: No lymph nodes  in the neck.  MUSCULOSKELETAL: No clubbing. Edema 1+. No cyanosis.  SKIN: No rashes or ulcers seen.  NEUROLOGICAL: Cranial nerves II through XII grossly intact. Deep tendon reflexes difficult to elicit.  PSYCHIATRIC: The patient is oriented to person, place and time.   LABORATORY AND  RADIOLOGICAL DATA: No acute cardiopulmonary disease on chest x-ray. Nothing urgent on abdominal x-ray. Glucose 135, BUN 30 creatinine 3.44, sodium 123, potassium 2.7, chloride 91, CO2 25, calcium 9.3. AST slightly elevated at 77. Other liver function tests normal. White blood cell count 9.6, H and H 14.2 and 42.0, platelet count of 249. Lipase 185. Troponin negative. Magnesium 1.5. EKG: Sinus tachycardia at 116 beats per minute, nonspecific ST-T wave changes.   ASSESSMENT AND PLAN:  1. Acute renal failure with dehydration, likely secondary to the gastroenteritis. Will give IV fluid hydration. Hold enalapril and continue to monitor creatinine on a daily basis. Monitor urine output.  2. Severe electrolyte abnormalities, likely secondary to severe dehydration and not able to eat. This includes hyponatremia, hypokalemia and hypomagnesemia. Will give IV fluid hydration with normal saline with potassium and magnesium for 1 liter and then potassium afterwards and give a bolus dose of magnesium a little bit later in the day. Check serial electrolytes. Will monitor on telemetry until electrolytes are better.  3. Gastroenteritis with nausea, vomiting and diarrhea. Will send off stool studies and empiric Cipro and Flagyl since this has been going on for a few days.  4. Chronic respiratory failure, chronic obstructive pulmonary disease, on home oxygen. Respiratory status stable. Continue oxygen supplementation and inhalers.  5. History of hypertension. Will cut back on the clonidine and hold the enalapril at this point. 6. Diabetes. Will put on sliding scale.  7. Hyperlipidemia. Continue statin.   TIME SPENT ON ADMISSION: 55 minutes.   ____________________________ Herschell Dimesichard J. Renae GlossWieting, MD rjw:OSi D: 10/14/2012 13:05:20 ET T: 10/14/2012 13:44:33 ET JOB#: [id number]  cc: Herschell Dimesichard J. Renae GlossWieting, MD, <Dictator> Marco A. Madelon LipsAleman, MD Salley ScarletICHARD J Gwyneth Fernandez MD ELECTRONICALLY SIGNED 10/29/2012 16:31

## 2014-07-11 NOTE — Progress Notes (Signed)
Patient ID: Kelly Hogan, female   DOB: 06/16/1940, 74 y.o.   MRN: [id number]  Subjective: 74 y.o.-year-old female returns the office today for painful, elongated, thickened toenails which she is unable to trim herself. Denies any redness or drainage around the nails. She has a states that she started to have pain to the left heel particularly in the morning which is relieved by ambulation. She states that after ambulation she does not have any discomfort. She denies any history of injury or trauma. Denies any swelling or redness around the area. The pain does not wake her up at night. Her last blood sugar check today with 97. Denies any acute changes since last appointment and no new complaints today. Denies any systemic complaints such as fevers, chills, nausea, vomiting.   Objective: AAO 3, NAD DP/PT pulses palpable, CRT less than 3 seconds Protective sensation intact with Simms Weinstein monofilament, Achilles tendon reflex intact.  Nails hypertrophic, dystrophic, elongated, brittle, discolored 10. There is tenderness overlying the nails 1-5 bilaterally. There is no surrounding erythema or drainage along the nail sites. No open lesions or pre-ulcerative lesions are identified. There is mild tennis palpation along the plantar medial tubercle of the calcaneus at the insertion the plantar fascia on the left foot. There is no pain on the course of plantar fascial in the arch of the foot. No pain on the posterior aspect of the calcaneus or along the course last insertion of the Achilles tendon. There is no pain with lateral compression of the calcaneus or pain with vibratory sensation. No other areas of tenderness bilateral lower extremities. No overlying edema, erythema, increased warmth. No pain with calf compression, swelling, warmth, erythema.  Assessment: Patient presents with symptomatic onychomycosis; plantar fasciitis  Plan: -Treatment options including alternatives, risks,  complications were discussed -Nails sharply debrided 10 without complication/bleeding. -Discussed daily foot inspection. If there are any changes, to call the office immediately.  -In regards to the left heel pain, x-rays were obtained and reviewed with the patient. Discussed likely etiology. Discussed stretching and icing activities. Dispensed night splint. Discussed shoe gear modifications and possible orthotics the future. Recommended not to go barefoot even at home. Discussed steroid injection however she wishes to hold off on that this time. -Follow-up in 3 weeks or sooner if any problems are to arise. In the meantime, encouraged to call the office with any questions, concerns, changes symptoms.

## 2014-09-26 DIAGNOSIS — K259 Gastric ulcer, unspecified as acute or chronic, without hemorrhage or perforation: Secondary | ICD-10-CM | POA: Insufficient documentation

## 2014-10-03 ENCOUNTER — Ambulatory Visit: Payer: Medicare Other

## 2014-10-12 ENCOUNTER — Ambulatory Visit (INDEPENDENT_AMBULATORY_CARE_PROVIDER_SITE_OTHER): Payer: Medicare Other | Admitting: Podiatry

## 2014-10-12 DIAGNOSIS — M79676 Pain in unspecified toe(s): Secondary | ICD-10-CM | POA: Diagnosis not present

## 2014-10-12 DIAGNOSIS — B351 Tinea unguium: Secondary | ICD-10-CM | POA: Diagnosis not present

## 2014-10-12 DIAGNOSIS — M722 Plantar fascial fibromatosis: Secondary | ICD-10-CM

## 2014-10-12 DIAGNOSIS — Q828 Other specified congenital malformations of skin: Secondary | ICD-10-CM

## 2014-10-12 NOTE — Progress Notes (Signed)
Patient ID: Kelly Hogan, female   DOB: December 06, 1940, 74 y.o.   MRN: [id number]  Subjective: 74 y.o.-year-old female returns the office today for painful, elongated, thickened toenails which she is unable to trim herself. Denies any redness or drainage around the nails. She has a states that she continues to have some mild pain to the left heel particularly in the morning which is relieved by ambulation. She does believe that it is better than last appointment. She states that after ambulation she does not have any discomfort. She denies any history of injury or trauma. Denies any swelling or redness around the area. The pain does not wake her up at night. Denies any acute changes since last appointment and no new complaints today. Denies any systemic complaints such as fevers, chills, nausea, vomiting.   Objective: AAO 3, NAD DP/PT pulses palpable, CRT less than 3 seconds Protective sensation intact with Simms Weinstein monofilament, Achilles tendon reflex intact.  Nails hypertrophic, dystrophic, elongated, brittle, discolored 10. There is tenderness overlying the nails 1-5 bilaterally. There is no surrounding erythema or drainage along the nail sites. No open lesions or pre-ulcerative lesions are identified. There is slight tenderness to palpation along the plantar medial tubercle of the calcaneus at the insertion the plantar fascia on the left foot. There is no pain on the course of plantar fascial in the arch of the foot. No pain on the posterior aspect of the calcaneus or along the course last insertion of the Achilles tendon. There is no pain with lateral compression of the calcaneus or pain with vibratory sensation. No other areas of tenderness bilateral lower extremities. No overlying edema, erythema, increased warmth. No pain with calf compression, swelling, warmth, erythema.  Assessment: Patient presents with symptomatic onychomycosis; plantar fasciitis  Plan: -Treatment options  including alternatives, risks, complications were discussed -Nails sharply debrided 10 without complication/bleeding. -Discussed daily foot inspection. If there are any changes, to call the office immediately.  -In regards to the left heel pain continue stretching and icing activities. Discussed steroid injection however she wishes to hold off on that this time. -Follow-up in 3 months or sooner if any problems are to arise. In the meantime, encouraged to call the office with any questions, concerns, changes symptoms. If the heel pain continues over the next 3-4 weeks to call the office.   Ovid Curd, DPM

## 2015-01-16 ENCOUNTER — Ambulatory Visit (INDEPENDENT_AMBULATORY_CARE_PROVIDER_SITE_OTHER): Payer: Medicare Other | Admitting: Sports Medicine

## 2015-01-16 ENCOUNTER — Encounter: Payer: Self-pay | Admitting: Sports Medicine

## 2015-01-16 DIAGNOSIS — Q828 Other specified congenital malformations of skin: Secondary | ICD-10-CM | POA: Diagnosis not present

## 2015-01-16 DIAGNOSIS — M79676 Pain in unspecified toe(s): Secondary | ICD-10-CM

## 2015-01-16 DIAGNOSIS — E119 Type 2 diabetes mellitus without complications: Secondary | ICD-10-CM | POA: Diagnosis not present

## 2015-01-16 DIAGNOSIS — B351 Tinea unguium: Secondary | ICD-10-CM | POA: Diagnosis not present

## 2015-01-16 NOTE — Progress Notes (Signed)
Patient ID: Kelly Hogan, female   DOB: [birth year], 74 y.o.   MRN: [id number] Subjective: Kelly Hogan is a 73 y.o. female patient with history of type 2 diabetes who presents to office today complaining of callouses and long, painful nails  while ambulating in shoes; unable to trim. Patient states that the glucose reading this morning was 116 mg/dl. Patient denies any new changes in medication or new problems. Patient denies any new cramping, numbness, burning or tingling in the legs.  There are no active problems to display for this patient.  Current Outpatient Prescriptions on File Prior to Visit  Medication Sig Dispense Refill  . Blood Glucose Monitoring Suppl (ACCU-CHEK NANO SMARTVIEW) W/DEVICE KIT     . cloNIDine (CATAPRES) 0.1 MG tablet Take 0.1 mg by mouth 3 (three) times daily.    . enalapril (VASOTEC) 20 MG tablet Take 20 mg by mouth daily.    . Fluticasone-Salmeterol (ADVAIR) 250-50 MCG/DOSE AEPB Inhale 1 puff into the lungs 2 (two) times daily.    Marland Kitchen omeprazole (PRILOSEC) 20 MG capsule     . polyethylene glycol powder (GLYCOLAX/MIRALAX) powder     . simvastatin (ZOCOR) 10 MG tablet Take 10 mg by mouth daily.    Marland Kitchen tiotropium (SPIRIVA) 18 MCG inhalation capsule Place 18 mcg into inhaler and inhale daily.     No current facility-administered medications on file prior to visit.   Allergies  Allergen Reactions  . Enalapril Swelling    angioedema  . Oxycodone-Acetaminophen Shortness Of Breath  . Aspirin   . Hydrochlorothiazide Rash  . Penicillins Rash  . Ranitidine Hcl Rash    Labs:HEMOGLOBIN A1C- No recent lab on file  Objective: General: Patient is awake, alert, and oriented x 3 and in no acute distress.  Integument: Skin is warm, dry and supple bilateral. Nails are tender, long, thickened and  dystrophic with subungual debris, consistent with onychomycosis, 1-5 bilateral. No signs of infection. No open lesions. Callus with nucleated core present left plantar  hallux, right 3rd toe, and right sub met 4 with no signs of infection. Remaining integument unremarkable.  Vasculature:  Dorsalis Pedis pulse 1/4 bilateral. Posterior Tibial pulse 1 /4 bilateral.  Capillary fill time <3 sec 1-5 bilateral. Scant hair growth to the level of the digits. Temperature gradient within normal limits. + varicosities present bilateral. No edema present bilateral.   Neurology: The patient has intact sensation measured with a 5.07/10g Semmes Weinstein Monofilament at all pedal sites bilateral . Vibratory sensation diminished bilateral with tuning fork. No Babinski sign present bilateral.   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 Muscular strength 5/5 in all lower extremity muscular groups bilateral without pain or limitation on range of motion . No tenderness with calf compression bilateral.  Assessment and Plan: Problem List Items Addressed This Visit    None    Visit Diagnoses    Dermatophytosis of nail    -  Primary    Porokeratosis        Pain of toe, unspecified laterality        Diabetes mellitus without complication ([place])          -Examined patient. -Discussed and educated patient on diabetic foot care, especially with  regards to the vascular, neurological and musculoskeletal systems.  -Stressed the importance of good glycemic control and the detriment of not  controlling glucose levels in relation to the foot. -Mechanically debrided all nails 1-5 bilateral using sterile nail nipper and filed with dremel without incident  -Debrided porokeratoses  x3 using sterile chisel blade without incident -Answered all patient questions -Patient to return in 3 months for at risk foot care -Patient advised to call the office if any problems or questions arise in the  Meantime.  Landis Martins, DPM

## 2015-04-20 ENCOUNTER — Ambulatory Visit (INDEPENDENT_AMBULATORY_CARE_PROVIDER_SITE_OTHER): Payer: Medicare Other | Admitting: Sports Medicine

## 2015-04-20 ENCOUNTER — Encounter: Payer: Self-pay | Admitting: Sports Medicine

## 2015-04-20 DIAGNOSIS — B351 Tinea unguium: Secondary | ICD-10-CM

## 2015-04-20 DIAGNOSIS — M79676 Pain in unspecified toe(s): Secondary | ICD-10-CM | POA: Diagnosis not present

## 2015-04-20 DIAGNOSIS — E119 Type 2 diabetes mellitus without complications: Secondary | ICD-10-CM | POA: Diagnosis not present

## 2015-04-20 DIAGNOSIS — Q828 Other specified congenital malformations of skin: Secondary | ICD-10-CM

## 2015-04-20 NOTE — Patient Instructions (Signed)
Recommend Eucerin or Lubriderm Cream to use daily to feet dry skin/callus areas

## 2015-04-20 NOTE — Progress Notes (Signed)
Patient ID: Kelly Hogan, female   DOB: 10-Jun-1940, 75 y.o.   MRN: [id number] Subjective: Kelly Hogan is a 75 y.o. female patient with history of type 2 diabetes who presents to office today complaining of callouses and long, painful nails  while ambulating in shoes; unable to trim. Patient states that the glucose reading this morning was 120 mg/dl. Patient denies any new changes in medication or new problems. Patient denies any new cramping, numbness, burning or tingling in the legs.  There are no active problems to display for this patient.  Current Outpatient Prescriptions on File Prior to Visit  Medication Sig Dispense Refill  . Blood Glucose Monitoring Suppl (ACCU-CHEK NANO SMARTVIEW) W/DEVICE KIT     . cloNIDine (CATAPRES) 0.1 MG tablet Take 0.1 mg by mouth 3 (three) times daily.    . enalapril (VASOTEC) 20 MG tablet Take 20 mg by mouth daily.    . Fluticasone-Salmeterol (ADVAIR) 250-50 MCG/DOSE AEPB Inhale 1 puff into the lungs 2 (two) times daily.    Marland Kitchen omeprazole (PRILOSEC) 20 MG capsule     . polyethylene glycol powder (GLYCOLAX/MIRALAX) powder     . simvastatin (ZOCOR) 10 MG tablet Take 10 mg by mouth daily.    Marland Kitchen tiotropium (SPIRIVA) 18 MCG inhalation capsule Place 18 mcg into inhaler and inhale daily.     No current facility-administered medications on file prior to visit.   Allergies  Allergen Reactions  . Enalapril Swelling    angioedema  . Oxycodone-Acetaminophen Shortness Of Breath  . Aspirin   . Hydrochlorothiazide Rash  . Penicillins Rash  . Ranitidine Hcl Rash     Objective: General: Patient is awake, alert, and oriented x 3 and in no acute distress.  Integument: Skin is warm, dry and supple bilateral. Nails are tender, long, thickened and  dystrophic with subungual debris, consistent with onychomycosis, 1-5 bilateral. No signs of infection. No open lesions. Callus with nucleated core present left plantar hallux, right 3rd toe, and right sub met 4 with no  signs of infection. Remaining integument unremarkable.  Vasculature:  Dorsalis Pedis pulse 1/4 bilateral. Posterior Tibial pulse 1 /4 bilateral.  Capillary fill time <3 sec 1-5 bilateral. Scant hair growth to the level of the digits. Temperature gradient within normal limits. + varicosities present bilateral. No edema present bilateral.   Neurology: The patient has intact sensation measured with a 5.07/10g Semmes Weinstein Monofilament at all pedal sites bilateral . Vibratory sensation diminished bilateral with tuning fork. No Babinski sign present bilateral.   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 Muscular strength 5/5 in all lower extremity muscular groups bilateral without pain or limitation on range of motion . No tenderness with calf compression bilateral.  Assessment and Plan: Problem List Items Addressed This Visit    None    Visit Diagnoses    Dermatophytosis of nail    -  Primary    Porokeratosis        Pain of toe, unspecified laterality        Diabetes mellitus without complication ([place])          -Examined patient. -Discussed and educated patient on diabetic foot care, especially with  regards to the vascular, neurological and musculoskeletal systems.  -Stressed the importance of good glycemic control and the detriment of not  controlling glucose levels in relation to the foot. -Mechanically debrided all nails 1-5 bilateral using sterile nail nipper and filed with dremel without incident  -Debrided porokeratoses x3 using sterile chisel blade without incident -  Answered all patient questions -Patient to return in 3 months for at risk foot care -Patient advised to call the office if any problems or questions arise in the meantime.  Landis Martins, DPM

## 2015-05-22 DIAGNOSIS — M545 Low back pain, unspecified: Secondary | ICD-10-CM | POA: Insufficient documentation

## 2015-07-20 ENCOUNTER — Encounter: Payer: Self-pay | Admitting: Sports Medicine

## 2015-07-20 ENCOUNTER — Ambulatory Visit (INDEPENDENT_AMBULATORY_CARE_PROVIDER_SITE_OTHER): Payer: Medicare Other | Admitting: Sports Medicine

## 2015-07-20 DIAGNOSIS — Q828 Other specified congenital malformations of skin: Secondary | ICD-10-CM | POA: Diagnosis not present

## 2015-07-20 DIAGNOSIS — E119 Type 2 diabetes mellitus without complications: Secondary | ICD-10-CM

## 2015-07-20 DIAGNOSIS — M79676 Pain in unspecified toe(s): Secondary | ICD-10-CM

## 2015-07-20 DIAGNOSIS — B351 Tinea unguium: Secondary | ICD-10-CM | POA: Diagnosis not present

## 2015-07-20 NOTE — Progress Notes (Signed)
Patient ID: MISHAAL Hogan, female   DOB: February 16, 1941, 75 y.o.   MRN: [id number]   Subjective: Kelly Hogan is a 75 y.o. female patient with history of type 2 diabetes who returns to office today complaining of callouses and long, painful nails  while ambulating in shoes; unable to trim. Patient states that the glucose reading ranges 110-120 mg/dl. Patient denies any new changes in medication or new problems. Patient denies any new cramping, numbness, burning or tingling in the legs.  Patient Active Problem List   Diagnosis Date Noted  . Acute low back pain 05/22/2015  . Gastric ulcer 09/26/2014  . Osteopenia 09/07/2013  . H/O adenomatous polyp of colon 03/20/2013  . Chronic obstructive pulmonary disease ([place]) 07/05/2012  . Acid reflux 05/04/2012  . Chronic respiratory failure ([place]) 11/25/2010  . Chronic venous insufficiency 01/29/2010  . Type 2 diabetes mellitus ([place]) 11/07/2008  . Airway hyperreactivity 11/30/1996  . Essential (primary) hypertension 11/30/1996   Current Outpatient Prescriptions on File Prior to Visit  Medication Sig Dispense Refill  . Blood Glucose Monitoring Suppl (ACCU-CHEK NANO SMARTVIEW) W/DEVICE KIT     . cloNIDine (CATAPRES) 0.1 MG tablet Take 0.1 mg by mouth 3 (three) times daily.    . enalapril (VASOTEC) 20 MG tablet Take 20 mg by mouth daily.    . Fluticasone-Salmeterol (ADVAIR) 250-50 MCG/DOSE AEPB Inhale 1 puff into the lungs 2 (two) times daily.    Marland Kitchen omeprazole (PRILOSEC) 20 MG capsule     . polyethylene glycol powder (GLYCOLAX/MIRALAX) powder     . simvastatin (ZOCOR) 10 MG tablet Take 10 mg by mouth daily.    Marland Kitchen tiotropium (SPIRIVA) 18 MCG inhalation capsule Place 18 mcg into inhaler and inhale daily.     No current facility-administered medications on file prior to visit.   Allergies  Allergen Reactions  . Enalapril Swelling    angioedema  . Oxycodone-Acetaminophen Shortness Of Breath  . Ranitidine Hcl Swelling  . Aspirin   .  Hydrochlorothiazide Rash  . Penicillins Rash  . Ranitidine Hcl Rash     Objective: General: Patient is awake, alert, and oriented x 3 and in no acute distress.  Integument: Skin is warm, dry and supple bilateral. Nails are tender, long, thickened and  dystrophic with subungual debris, consistent with onychomycosis, 1-5 bilateral. No signs of infection. No open lesions. Callus with nucleated core present left plantar hallux, right 3rd toe, and right sub met 4 with no signs of infection. Remaining integument unremarkable.  Vasculature:  Dorsalis Pedis pulse 1/4 bilateral. Posterior Tibial pulse 1 /4 bilateral.  Capillary fill time <3 sec 1-5 bilateral. Scant hair growth to the level of the digits. Temperature gradient within normal limits. + varicosities present bilateral. No edema present bilateral.   Neurology: The patient has intact sensation measured with a 5.07/10g Semmes Weinstein Monofilament at all pedal sites bilateral . Vibratory sensation diminished bilateral with tuning fork. No Babinski sign present bilateral.   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 Muscular strength 5/5 in all lower extremity muscular groups bilateral without pain or limitation on range of motion . No tenderness with calf compression bilateral.  Assessment and Plan: Problem List Items Addressed This Visit    None    Visit Diagnoses    Dermatophytosis of nail    -  Primary    Porokeratosis        Pain of toe, unspecified laterality        Diabetes mellitus without complication ([place])          -  Examined patient. -Discussed and educated patient on diabetic foot care, especially with  regards to the vascular, neurological and musculoskeletal systems.  -Stressed the importance of good glycemic control and the detriment of not  controlling glucose levels in relation to the foot. -Mechanically debrided all nails 1-5 bilateral using sterile nail nipper and filed with  dremel without incident  -Debrided porokeratoses x3 using sterile chisel blade without incident -Answered all patient questions -Patient to return in 3 months for at risk foot care -Patient advised to call the office if any problems or questions arise in the meantime.  Kelly Hogan, DPM

## 2015-10-26 ENCOUNTER — Ambulatory Visit (INDEPENDENT_AMBULATORY_CARE_PROVIDER_SITE_OTHER): Payer: Medicare Other | Admitting: Sports Medicine

## 2015-10-26 ENCOUNTER — Encounter: Payer: Self-pay | Admitting: Sports Medicine

## 2015-10-26 DIAGNOSIS — M79676 Pain in unspecified toe(s): Secondary | ICD-10-CM

## 2015-10-26 DIAGNOSIS — Q828 Other specified congenital malformations of skin: Secondary | ICD-10-CM | POA: Diagnosis not present

## 2015-10-26 DIAGNOSIS — B351 Tinea unguium: Secondary | ICD-10-CM

## 2015-10-26 DIAGNOSIS — E119 Type 2 diabetes mellitus without complications: Secondary | ICD-10-CM

## 2015-10-26 NOTE — Progress Notes (Signed)
Patient ID: Kelly Hogan, female   DOB: 1940-07-29, 75 y.o.   MRN: [id number]   Subjective: Kelly Hogan is a 75 y.o. female patient with history of type 2 diabetes who returns to office today complaining of callouses and long, painful nails  while ambulating in shoes; unable to trim. Patient states that the glucose reading this morning was 111 mg/dl. Patient denies any new changes in medication or new problems. Patient denies any new cramping, numbness, burning or tingling in the legs.  Patient Active Problem List   Diagnosis Date Noted  . Acute low back pain 05/22/2015  . Gastric ulcer 09/26/2014  . Osteopenia 09/07/2013  . H/O adenomatous polyp of colon 03/20/2013  . Chronic obstructive pulmonary disease ([place]) 07/05/2012  . Acid reflux 05/04/2012  . Chronic respiratory failure ([place]) 11/25/2010  . Chronic venous insufficiency 01/29/2010  . Type 2 diabetes mellitus ([place]) 11/07/2008  . Airway hyperreactivity 11/30/1996  . Essential (primary) hypertension 11/30/1996   Current Outpatient Prescriptions on File Prior to Visit  Medication Sig Dispense Refill  . Blood Glucose Monitoring Suppl (ACCU-CHEK NANO SMARTVIEW) W/DEVICE KIT     . cloNIDine (CATAPRES) 0.1 MG tablet Take 0.1 mg by mouth 3 (three) times daily.    . enalapril (VASOTEC) 20 MG tablet Take 20 mg by mouth daily.    . Fluticasone-Salmeterol (ADVAIR) 250-50 MCG/DOSE AEPB Inhale 1 puff into the lungs 2 (two) times daily.    Marland Kitchen omeprazole (PRILOSEC) 20 MG capsule     . polyethylene glycol powder (GLYCOLAX/MIRALAX) powder     . simvastatin (ZOCOR) 10 MG tablet Take 10 mg by mouth daily.    Marland Kitchen tiotropium (SPIRIVA) 18 MCG inhalation capsule Place 18 mcg into inhaler and inhale daily.     No current facility-administered medications on file prior to visit.    Allergies  Allergen Reactions  . Enalapril Swelling    angioedema  . Oxycodone-Acetaminophen Shortness Of Breath  . Ranitidine Hcl Swelling  . Aspirin   .  Hydrochlorothiazide Rash  . Penicillins Rash  . Ranitidine Hcl Rash     Objective: General: Patient is awake, alert, and oriented x 3 and in no acute distress.  Integument: Skin is warm, dry and supple bilateral. Nails are tender, long, thickened and dystrophic with subungual debris, consistent with onychomycosis, 1-5 bilateral. No signs of infection. No open lesions. Callus with nucleated core present left plantar hallux, right 3rd toe, and right sub met 3 with no signs of infection. Remaining integument unremarkable.  Vasculature:  Dorsalis Pedis pulse 1/4 bilateral. Posterior Tibial pulse 1 /4 bilateral.  Capillary fill time <3 sec 1-5 bilateral. Scant hair growth to the level of the digits. Temperature gradient within normal limits. + varicosities present bilateral. No edema present bilateral.   Neurology: The patient has intact sensation measured with a 5.07/10g Semmes Weinstein Monofilament at all pedal sites bilateral . Vibratory sensation diminished bilateral with tuning fork. No Babinski sign present bilateral.   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 Muscular strength 5/5 in all lower extremity muscular groups bilateral without pain or limitation on range of motion . No tenderness with calf compression bilateral.  Assessment and Plan: Problem List Items Addressed This Visit    None    Visit Diagnoses    Dermatophytosis of nail    -  Primary   Porokeratosis       Pain of toe, unspecified laterality       Diabetes mellitus without complication ([place])         -  Examined patient. -Discussed and educated patient on diabetic foot care, especially with  regards to the vascular, neurological and musculoskeletal systems.  -Stressed the importance of good glycemic control and the detriment of not  controlling glucose levels in relation to the foot. -Mechanically debrided all nails 1-5 bilateral using sterile nail nipper and filed with dremel  without incident  -Debrided porokeratoses x3 using sterile chisel blade without incident -Answered all patient questions -Patient to return in 3 months for at risk foot care -Patient advised to call the office if any problems or questions arise in the meantime.  Landis Martins, DPM

## 2016-02-01 ENCOUNTER — Ambulatory Visit: Payer: Medicare Other | Admitting: Podiatry

## 2016-02-13 ENCOUNTER — Encounter: Payer: Self-pay | Admitting: Podiatry

## 2016-02-13 ENCOUNTER — Ambulatory Visit (INDEPENDENT_AMBULATORY_CARE_PROVIDER_SITE_OTHER): Payer: Medicare Other | Admitting: Podiatry

## 2016-02-13 DIAGNOSIS — L84 Corns and callosities: Secondary | ICD-10-CM | POA: Diagnosis not present

## 2016-02-13 DIAGNOSIS — B351 Tinea unguium: Secondary | ICD-10-CM

## 2016-02-13 DIAGNOSIS — M79676 Pain in unspecified toe(s): Secondary | ICD-10-CM | POA: Diagnosis not present

## 2016-02-13 DIAGNOSIS — Q828 Other specified congenital malformations of skin: Secondary | ICD-10-CM

## 2016-02-13 NOTE — Progress Notes (Signed)
She presents today chief complaint of painful elongated toenails with corns and calluses.  Objective: Toenails are thick L dystrophic with mycotic painful palpation as well as debridement. Multiple porokeratotic lesions to distal aspect third fourth digits of the right foot.  Assessment: Pain and limb secondary onychomycosis and porokeratosis.  Plan: Debridement of all reactive hyperkeratotic tissue debridement of nails 1 through 5 bilateral.

## 2016-05-05 ENCOUNTER — Encounter: Payer: Self-pay | Admitting: Podiatry

## 2016-05-05 ENCOUNTER — Ambulatory Visit (INDEPENDENT_AMBULATORY_CARE_PROVIDER_SITE_OTHER): Payer: Medicare Other | Admitting: Podiatry

## 2016-05-05 DIAGNOSIS — Q828 Other specified congenital malformations of skin: Secondary | ICD-10-CM

## 2016-05-05 DIAGNOSIS — E119 Type 2 diabetes mellitus without complications: Secondary | ICD-10-CM | POA: Diagnosis not present

## 2016-05-05 DIAGNOSIS — B351 Tinea unguium: Secondary | ICD-10-CM | POA: Diagnosis not present

## 2016-05-05 DIAGNOSIS — M79676 Pain in unspecified toe(s): Secondary | ICD-10-CM

## 2016-05-05 NOTE — Progress Notes (Signed)
Complaint:  Visit Type: Patient returns to my office for continued preventative foot care services. Complaint: Patient states" my nails have grown long and thick and become painful to walk and wear shoes" Patient has been diagnosed with DM with no foot complications. The patient presents for preventative foot care services. No changes to ROS  Podiatric Exam: Vascular: dorsalis pedis and posterior tibial pulses are palpable bilateral. Capillary return is immediate. Temperature gradient is WNL. Skin turgor WNL  Sensorium: Normal Semmes Weinstein monofilament test. Normal tactile sensation bilaterally. Nail Exam: Pt has thick disfigured discolored nails with subungual debris noted bilateral entire nail hallux through fifth toenails Ulcer Exam: There is no evidence of ulcer or pre-ulcerative changes or infection. Orthopedic Exam: Muscle tone and strength are WNL. No limitations in general ROM. No crepitus or effusions noted. Foot type and digits show no abnormalities. Bony prominences are unremarkable. Hammer toes  B/L Skin: No Porokeratosis. No infection or ulcers.  Distal clavi 3rd toe right foot.  Diagnosis:  Onychomycosis, , Pain in right toe, pain in left toes  Treatment & Plan Procedures and Treatment: Consent by patient was obtained for treatment procedures. The patient understood the discussion of treatment and procedures well. All questions were answered thoroughly reviewed. Debridement of mycotic and hypertrophic toenails, 1 through 5 bilateral and clearing of subungual debris. No ulceration, no infection noted. Debride clavi. Return Visit-Office Procedure: Patient instructed to return to the office for a follow up visit 3 months for continued evaluation and treatment.    Helane GuntherGregory Aarik Blank DPM

## 2016-08-04 ENCOUNTER — Ambulatory Visit: Payer: Medicare Other | Admitting: Podiatry

## 2016-08-28 ENCOUNTER — Encounter: Payer: Self-pay | Admitting: Podiatry

## 2016-08-28 ENCOUNTER — Ambulatory Visit (INDEPENDENT_AMBULATORY_CARE_PROVIDER_SITE_OTHER): Payer: Medicare Other | Admitting: Podiatry

## 2016-08-28 DIAGNOSIS — M79676 Pain in unspecified toe(s): Secondary | ICD-10-CM

## 2016-08-28 DIAGNOSIS — B351 Tinea unguium: Secondary | ICD-10-CM

## 2016-08-28 DIAGNOSIS — L84 Corns and callosities: Secondary | ICD-10-CM | POA: Diagnosis not present

## 2016-08-28 DIAGNOSIS — E119 Type 2 diabetes mellitus without complications: Secondary | ICD-10-CM

## 2016-08-28 NOTE — Progress Notes (Signed)
Complaint:  Visit Type: Patient returns to my office for continued preventative foot care services. Complaint: Patient states" my nails have grown long and thick and become painful to walk and wear shoes" Patient has been diagnosed with DM with no foot complications. The patient presents for preventative foot care services. No changes to ROS  Podiatric Exam: Vascular: dorsalis pedis and posterior tibial pulses are palpable bilateral. Capillary return is immediate. Temperature gradient is WNL. Skin turgor WNL  Sensorium: Normal Semmes Weinstein monofilament test. Normal tactile sensation bilaterally. Nail Exam: Pt has thick disfigured discolored nails with subungual debris noted bilateral entire nail hallux through fifth toenails Ulcer Exam: There is no evidence of ulcer or pre-ulcerative changes or infection. Orthopedic Exam: Muscle tone and strength are WNL. No limitations in general ROM. No crepitus or effusions noted. Foot type and digits show no abnormalities. Bony prominences are unremarkable. Hammer toes  B/L Skin: No Porokeratosis. No infection or ulcers.  Distal clavi 3rd toe right foot.  Diagnosis:  Onychomycosis, , Pain in right toe, pain in left toes  Treatment & Plan Procedures and Treatment: Consent by patient was obtained for treatment procedures. The patient understood the discussion of treatment and procedures well. All questions were answered thoroughly reviewed. Debridement of mycotic and hypertrophic toenails, 1 through 5 bilateral and clearing of subungual debris. No ulceration, no infection noted. Debride clavi. Return Visit-Office Procedure: Patient instructed to return to the office for a follow up visit 3 months for continued evaluation and treatment.    Helane GuntherGregory Ceazia Harb DPM

## 2016-12-04 ENCOUNTER — Encounter: Payer: Self-pay | Admitting: Podiatry

## 2016-12-04 ENCOUNTER — Ambulatory Visit (INDEPENDENT_AMBULATORY_CARE_PROVIDER_SITE_OTHER): Payer: Medicare Other | Admitting: Podiatry

## 2016-12-04 DIAGNOSIS — M79676 Pain in unspecified toe(s): Secondary | ICD-10-CM | POA: Diagnosis not present

## 2016-12-04 DIAGNOSIS — E119 Type 2 diabetes mellitus without complications: Secondary | ICD-10-CM | POA: Diagnosis not present

## 2016-12-04 DIAGNOSIS — B351 Tinea unguium: Secondary | ICD-10-CM

## 2016-12-04 DIAGNOSIS — L84 Corns and callosities: Secondary | ICD-10-CM

## 2016-12-04 NOTE — Progress Notes (Signed)
Complaint:  Visit Type: Patient returns to my office for continued preventative foot care services. Complaint: Patient states" my nails have grown long and thick and become painful to walk and wear shoes" Patient has been diagnosed with DM with no foot complications. The patient presents for preventative foot care services. No changes to ROS  Podiatric Exam: Vascular: dorsalis pedis and posterior tibial pulses are palpable bilateral. Capillary return is immediate. Temperature gradient is WNL. Skin turgor WNL  Sensorium: Normal Semmes Weinstein monofilament test. Normal tactile sensation bilaterally. Nail Exam: Pt has thick disfigured discolored nails with subungual debris noted bilateral entire nail hallux through fifth toenails Ulcer Exam: There is no evidence of ulcer or pre-ulcerative changes or infection. Orthopedic Exam: Muscle tone and strength are WNL. No limitations in general ROM. No crepitus or effusions noted. Foot type and digits show no abnormalities. Bony prominences are unremarkable. Hammer toes  B/L Skin: No Porokeratosis. No infection or ulcers.  Distal clavi 3rd toe right foot.  Diagnosis:  Onychomycosis, , Pain in right toe, pain in left toes  Treatment & Plan Procedures and Treatment: Consent by patient was obtained for treatment procedures. The patient understood the discussion of treatment and procedures well. All questions were answered thoroughly reviewed. Debridement of mycotic and hypertrophic toenails, 1 through 5 bilateral and clearing of subungual debris. No ulceration, no infection noted. Debride clavi. Return Visit-Office Procedure: Patient instructed to return to the office for a follow up visit 3 months for continued evaluation and treatment.    Helane Gunther DPM

## 2017-03-05 ENCOUNTER — Ambulatory Visit (INDEPENDENT_AMBULATORY_CARE_PROVIDER_SITE_OTHER): Payer: Medicare Other | Admitting: Podiatry

## 2017-03-05 ENCOUNTER — Encounter: Payer: Self-pay | Admitting: Podiatry

## 2017-03-05 DIAGNOSIS — B351 Tinea unguium: Secondary | ICD-10-CM | POA: Diagnosis not present

## 2017-03-05 DIAGNOSIS — M79676 Pain in unspecified toe(s): Secondary | ICD-10-CM

## 2017-03-05 DIAGNOSIS — E119 Type 2 diabetes mellitus without complications: Secondary | ICD-10-CM | POA: Diagnosis not present

## 2017-03-05 DIAGNOSIS — L84 Corns and callosities: Secondary | ICD-10-CM | POA: Diagnosis not present

## 2017-03-05 DIAGNOSIS — I872 Venous insufficiency (chronic) (peripheral): Secondary | ICD-10-CM

## 2017-03-05 NOTE — Progress Notes (Signed)
Complaint:  Visit Type: Patient returns to my office for continued preventative foot care services. Complaint: Patient states" my nails have grown long and thick and become painful to walk and wear shoes" Patient has been diagnosed with DM with no foot complications. The patient presents for preventative foot care services. No changes to ROS  Podiatric Exam: Vascular: dorsalis pedis and posterior tibial pulses are palpable bilateral. Capillary return is immediate. Temperature gradient is WNL. Skin turgor WNL  Sensorium: Normal Semmes Weinstein monofilament test. Normal tactile sensation bilaterally. Nail Exam: Pt has thick disfigured discolored nails with subungual debris noted bilateral entire nail hallux through fifth toenails Ulcer Exam: There is no evidence of ulcer or pre-ulcerative changes or infection. Orthopedic Exam: Muscle tone and strength are WNL. No limitations in general ROM. No crepitus or effusions noted. Foot type and digits show no abnormalities. Bony prominences are unremarkable. Hammer toes  B/L Skin: No Porokeratosis. No infection or ulcers.  Distal clavi 3rd toe right foot. Callus distal hallux  B/L.  Diagnosis:  Onychomycosis, , Pain in right toe, pain in left toes  Treatment & Plan Procedures and Treatment: Consent by patient was obtained for treatment procedures. The patient understood the discussion of treatment and procedures well. All questions were answered thoroughly reviewed. Debridement of mycotic and hypertrophic toenails, 1 through 5 bilateral and clearing of subungual debris. No ulceration, no infection noted. Debride callus. Return Visit-Office Procedure: Patient instructed to return to the office for a follow up visit 3 months for continued evaluation and treatment.    Helane GuntherGregory Franky Reier DPM

## 2017-06-04 ENCOUNTER — Encounter: Payer: Self-pay | Admitting: Podiatry

## 2017-06-04 ENCOUNTER — Ambulatory Visit (INDEPENDENT_AMBULATORY_CARE_PROVIDER_SITE_OTHER): Payer: Medicare Other | Admitting: Podiatry

## 2017-06-04 DIAGNOSIS — L84 Corns and callosities: Secondary | ICD-10-CM | POA: Diagnosis not present

## 2017-06-04 DIAGNOSIS — B351 Tinea unguium: Secondary | ICD-10-CM

## 2017-06-04 DIAGNOSIS — E119 Type 2 diabetes mellitus without complications: Secondary | ICD-10-CM | POA: Diagnosis not present

## 2017-06-04 DIAGNOSIS — M79676 Pain in unspecified toe(s): Secondary | ICD-10-CM | POA: Diagnosis not present

## 2017-06-04 DIAGNOSIS — I872 Venous insufficiency (chronic) (peripheral): Secondary | ICD-10-CM

## 2017-06-04 NOTE — Progress Notes (Signed)
Complaint:  Visit Type: Patient returns to my office for continued preventative foot care services. Complaint: Patient states" my nails have grown long and thick and become painful to walk and wear shoes" Patient has been diagnosed with DM with no foot complications. The patient presents for preventative foot care services. No changes to ROS  Podiatric Exam: Vascular: dorsalis pedis and posterior tibial pulses are palpable bilateral. Capillary return is immediate. Temperature gradient is WNL. Skin turgor WNL  Sensorium: Normal Semmes Weinstein monofilament test. Normal tactile sensation bilaterally. Nail Exam: Pt has thick disfigured discolored nails with subungual debris noted bilateral entire nail hallux through fifth toenails Ulcer Exam: There is no evidence of ulcer or pre-ulcerative changes or infection. Orthopedic Exam: Muscle tone and strength are WNL. No limitations in general ROM. No crepitus or effusions noted. Foot type and digits show no abnormalities. Bony prominences are unremarkable. Hammer toes  B/L Skin: No Porokeratosis. No infection or ulcers.  Distal clavi 3rd toe right foot. Callus distal hallux  B/L.  Diagnosis:  Onychomycosis, , Pain in right toe, pain in left toes  Treatment & Plan Procedures and Treatment: Consent by patient was obtained for treatment procedures. The patient understood the discussion of treatment and procedures well. All questions were answered thoroughly reviewed. Debridement of mycotic and hypertrophic toenails, 1 through 5 bilateral and clearing of subungual debris. No ulceration, no infection noted. Debride callus. Return Visit-Office Procedure: Patient instructed to return to the office for a follow up visit 3 months for continued evaluation and treatment.    Jaisen Wiltrout DPM 

## 2017-10-05 ENCOUNTER — Ambulatory Visit (INDEPENDENT_AMBULATORY_CARE_PROVIDER_SITE_OTHER): Payer: Medicare Other | Admitting: Podiatry

## 2017-10-05 ENCOUNTER — Encounter: Payer: Self-pay | Admitting: Podiatry

## 2017-10-05 DIAGNOSIS — B351 Tinea unguium: Secondary | ICD-10-CM | POA: Diagnosis not present

## 2017-10-05 DIAGNOSIS — E119 Type 2 diabetes mellitus without complications: Secondary | ICD-10-CM

## 2017-10-05 DIAGNOSIS — M79676 Pain in unspecified toe(s): Secondary | ICD-10-CM | POA: Diagnosis not present

## 2017-10-05 NOTE — Progress Notes (Signed)
Complaint:  Visit Type: Patient returns to my office for continued preventative foot care services. Complaint: Patient states" my nails have grown long and thick and become painful to walk and wear shoes" Patient has been diagnosed with DM with no foot complications. The patient presents for preventative foot care services. No changes to ROS  Podiatric Exam: Vascular: dorsalis pedis and posterior tibial pulses are palpable bilateral. Capillary return is immediate. Temperature gradient is WNL. Skin turgor WNL  Sensorium: Normal Semmes Weinstein monofilament test. Normal tactile sensation bilaterally. Nail Exam: Pt has thick disfigured discolored nails with subungual debris noted bilateral entire nail hallux through fifth toenails Ulcer Exam: There is no evidence of ulcer or pre-ulcerative changes or infection. Orthopedic Exam: Muscle tone and strength are WNL. No limitations in general ROM. No crepitus or effusions noted. Foot type and digits show no abnormalities. Bony prominences are unremarkable. Hammer toes  B/L Skin: No Porokeratosis. No infection or ulcers.  Distal clavi 3rd toe right foot. Callus distal hallux  B/L.  Diagnosis:  Onychomycosis, , Pain in right toe, pain in left toes  Treatment & Plan Procedures and Treatment: Consent by patient was obtained for treatment procedures. The patient understood the discussion of treatment and procedures well. All questions were answered thoroughly reviewed. Debridement of mycotic and hypertrophic toenails, 1 through 5 bilateral and clearing of subungual debris. No ulceration, no infection noted. Return Visit-Office Procedure: Patient instructed to return to the office for a follow up visit 3 months for continued evaluation and treatment.    Helane GuntherGregory Dala Breault DPM

## 2018-01-04 ENCOUNTER — Encounter: Payer: Self-pay | Admitting: Podiatry

## 2018-01-04 ENCOUNTER — Ambulatory Visit (INDEPENDENT_AMBULATORY_CARE_PROVIDER_SITE_OTHER): Payer: Medicare Other | Admitting: Podiatry

## 2018-01-04 DIAGNOSIS — E119 Type 2 diabetes mellitus without complications: Secondary | ICD-10-CM | POA: Diagnosis not present

## 2018-01-04 DIAGNOSIS — M79676 Pain in unspecified toe(s): Secondary | ICD-10-CM

## 2018-01-04 DIAGNOSIS — B351 Tinea unguium: Secondary | ICD-10-CM

## 2018-01-04 DIAGNOSIS — I872 Venous insufficiency (chronic) (peripheral): Secondary | ICD-10-CM

## 2018-01-04 NOTE — Progress Notes (Signed)
Complaint:  Visit Type: Patient returns to my office for continued preventative foot care services. Complaint: Patient states" my nails have grown long and thick and become painful to walk and wear shoes" Patient has been diagnosed with DM with no foot complications. The patient presents for preventative foot care services. No changes to ROS  Podiatric Exam: Vascular: dorsalis pedis and posterior tibial pulses are palpable bilateral. Capillary return is immediate. Temperature gradient is WNL. Skin turgor WNL  Sensorium: Normal Semmes Weinstein monofilament test. Normal tactile sensation bilaterally. Nail Exam: Pt has thick disfigured discolored nails with subungual debris noted bilateral entire nail hallux through fifth toenails Ulcer Exam: There is no evidence of ulcer or pre-ulcerative changes or infection. Orthopedic Exam: Muscle tone and strength are WNL. No limitations in general ROM. No crepitus or effusions noted. Foot type and digits show no abnormalities. Bony prominences are unremarkable. Hammer toes  B/L Skin: No Porokeratosis. No infection or ulcers.  Distal clavi 3rd toe right foot. Callus distal hallux  B/L.  Diagnosis:  Onychomycosis, , Pain in right toe, pain in left toes  Treatment & Plan Procedures and Treatment: Consent by patient was obtained for treatment procedures. The patient understood the discussion of treatment and procedures well. All questions were answered thoroughly reviewed. Debridement of mycotic and hypertrophic toenails, 1 through 5 bilateral and clearing of subungual debris. No ulceration, no infection noted. Return Visit-Office Procedure: Patient instructed to return to the office for a follow up visit 3 months for continued evaluation and treatment.    Jiali Linney DPM 

## 2018-04-05 ENCOUNTER — Encounter: Payer: Self-pay | Admitting: Podiatry

## 2018-04-05 ENCOUNTER — Ambulatory Visit (INDEPENDENT_AMBULATORY_CARE_PROVIDER_SITE_OTHER): Payer: Medicare Other | Admitting: Podiatry

## 2018-04-05 DIAGNOSIS — B351 Tinea unguium: Secondary | ICD-10-CM

## 2018-04-05 DIAGNOSIS — I872 Venous insufficiency (chronic) (peripheral): Secondary | ICD-10-CM

## 2018-04-05 DIAGNOSIS — E119 Type 2 diabetes mellitus without complications: Secondary | ICD-10-CM | POA: Diagnosis not present

## 2018-04-05 DIAGNOSIS — M79676 Pain in unspecified toe(s): Secondary | ICD-10-CM | POA: Diagnosis not present

## 2018-04-05 DIAGNOSIS — L84 Corns and callosities: Secondary | ICD-10-CM | POA: Diagnosis not present

## 2018-04-05 NOTE — Progress Notes (Signed)
Complaint:  Visit Type: Patient returns to my office for continued preventative foot care services. Complaint: Patient states" my nails have grown long and thick and become painful to walk and wear shoes" Patient has been diagnosed with DM with no foot complications. The patient presents for preventative foot care services. No changes to ROS  Podiatric Exam: Vascular: dorsalis pedis and posterior tibial pulses are palpable bilateral. Capillary return is immediate. Temperature gradient is WNL. Skin turgor WNL  Sensorium: Normal Semmes Weinstein monofilament test. Normal tactile sensation bilaterally. Nail Exam: Pt has thick disfigured discolored nails with subungual debris noted bilateral entire nail hallux through fifth toenails Ulcer Exam: There is no evidence of ulcer or pre-ulcerative changes or infection. Orthopedic Exam: Muscle tone and strength are WNL. No limitations in general ROM. No crepitus or effusions noted. Foot type and digits show no abnormalities. Bony prominences are unremarkable. Hammer toes  B/L Skin: No Porokeratosis. No infection or ulcers.  Distal clavi 3rd toe right foot. Callus distal hallux  B/L.  Diagnosis:  Onychomycosis, , Pain in right toe, pain in left toes  Treatment & Plan Procedures and Treatment: Consent by patient was obtained for treatment procedures. The patient understood the discussion of treatment and procedures well. All questions were answered thoroughly reviewed. Debridement of mycotic and hypertrophic toenails, 1 through 5 bilateral and clearing of subungual debris. No ulceration, no infection noted. Return Visit-Office Procedure: Patient instructed to return to the office for a follow up visit 10 weeks  for continued evaluation and treatment.    Helane GuntherGregory Zhoe Catania DPM

## 2018-06-14 ENCOUNTER — Ambulatory Visit: Payer: Medicare Other | Admitting: Podiatry

## 2018-06-14 ENCOUNTER — Encounter: Payer: Self-pay | Admitting: Podiatry

## 2018-06-14 ENCOUNTER — Other Ambulatory Visit: Payer: Self-pay

## 2018-06-14 ENCOUNTER — Ambulatory Visit (INDEPENDENT_AMBULATORY_CARE_PROVIDER_SITE_OTHER): Payer: Medicare Other | Admitting: Podiatry

## 2018-06-14 VITALS — Temp 97.7°F

## 2018-06-14 DIAGNOSIS — B351 Tinea unguium: Secondary | ICD-10-CM | POA: Diagnosis not present

## 2018-06-14 DIAGNOSIS — E119 Type 2 diabetes mellitus without complications: Secondary | ICD-10-CM | POA: Diagnosis not present

## 2018-06-14 DIAGNOSIS — M79676 Pain in unspecified toe(s): Secondary | ICD-10-CM

## 2018-06-14 NOTE — Progress Notes (Signed)
Complaint:  Visit Type: Patient returns to my office for continued preventative foot care services. Complaint: Patient states" my nails have grown long and thick and become painful to walk and wear shoes" Patient has been diagnosed with DM with no foot complications. The patient presents for preventative foot care services. No changes to ROS  Podiatric Exam: Vascular: dorsalis pedis are palpable bilateral.  Posterior tibial pulses are absent due to swelling.Capillary return is immediate. Temperature gradient is WNL. Skin turgor WNL  Sensorium: Normal Semmes Weinstein monofilament test. Normal tactile sensation bilaterally. Nail Exam: Pt has thick disfigured discolored nails with subungual debris noted bilateral entire nail hallux through fifth toenails Ulcer Exam: There is no evidence of ulcer or pre-ulcerative changes or infection. Orthopedic Exam: Muscle tone and strength are WNL. No limitations in general ROM. No crepitus or effusions noted. Foot type and digits show no abnormalities. Bony prominences are unremarkable. Hammer toes  B/L Skin: No Porokeratosis. No infection or ulcers.  Distal clavi 3rd toe right foot asymptomatic.   Callus distal hallux  B/L. Fissure on distal aspect left hallux is healing with the use muprocin.  Diagnosis:  Onychomycosis, , Pain in right toe, pain in left toes  Treatment & Plan Procedures and Treatment: Consent by patient was obtained for treatment procedures. The patient understood the discussion of treatment and procedures well. All questions were answered thoroughly reviewed. Debridement of mycotic and hypertrophic toenails, 1 through 5 bilateral and clearing of subungual debris. No ulceration, no infection noted. Return Visit-Office Procedure: Patient instructed to return to the office for a follow up visit 10 weeks  for continued evaluation and treatment.    Helane Gunther DPM

## 2018-10-07 ENCOUNTER — Ambulatory Visit (INDEPENDENT_AMBULATORY_CARE_PROVIDER_SITE_OTHER): Payer: Medicare Other | Admitting: Podiatry

## 2018-10-07 ENCOUNTER — Other Ambulatory Visit: Payer: Self-pay

## 2018-10-07 ENCOUNTER — Encounter: Payer: Self-pay | Admitting: Podiatry

## 2018-10-07 VITALS — Temp 98.0°F

## 2018-10-07 DIAGNOSIS — E119 Type 2 diabetes mellitus without complications: Secondary | ICD-10-CM | POA: Diagnosis not present

## 2018-10-07 DIAGNOSIS — L84 Corns and callosities: Secondary | ICD-10-CM | POA: Diagnosis not present

## 2018-10-07 DIAGNOSIS — B351 Tinea unguium: Secondary | ICD-10-CM

## 2018-10-07 DIAGNOSIS — M79676 Pain in unspecified toe(s): Secondary | ICD-10-CM

## 2018-10-07 DIAGNOSIS — I872 Venous insufficiency (chronic) (peripheral): Secondary | ICD-10-CM

## 2018-10-07 NOTE — Progress Notes (Signed)
Complaint:  Visit Type: Patient returns to my office for continued preventative foot care services. Complaint: Patient states" my nails have grown long and thick and become painful to walk and wear shoes" Patient has been diagnosed with DM with no foot complications. The patient presents for preventative foot care services. No changes to ROS  Podiatric Exam: Vascular: dorsalis pedis are palpable bilateral.  Posterior tibial pulses are absent due to swelling.Capillary return is immediate. Temperature gradient is WNL. Skin turgor WNL  Sensorium: Normal Semmes Weinstein monofilament test. Normal tactile sensation bilaterally. Nail Exam: Pt has thick disfigured discolored nails with subungual debris noted bilateral entire nail hallux through fifth toenails Ulcer Exam: There is no evidence of ulcer or pre-ulcerative changes or infection. Orthopedic Exam: Muscle tone and strength are WNL. No limitations in general ROM. No crepitus or effusions noted. Foot type and digits show no abnormalities. Bony prominences are unremarkable. Hammer toes  B/L Skin: No Porokeratosis. No infection or ulcers.  Distal clavi 3rd toe right foot symptomatic.     Diagnosis:  Onychomycosis, , Pain in right toe, pain in left toes,  Clavi 3rd right.  Treatment & Plan Procedures and Treatment: Consent by patient was obtained for treatment procedures. The patient understood the discussion of treatment and procedures well. All questions were answered thoroughly reviewed. Debridement of mycotic and hypertrophic toenails, 1 through 5 bilateral and clearing of subungual debris. No ulceration, no infection noted.  Debride clavi. Return Visit-Office Procedure: Patient instructed to return to the office for a follow up visit 10 weeks  for continued evaluation and treatment.    Gardiner Barefoot DPM

## 2019-01-06 ENCOUNTER — Other Ambulatory Visit: Payer: Self-pay

## 2019-01-06 ENCOUNTER — Encounter: Payer: Self-pay | Admitting: Podiatry

## 2019-01-06 ENCOUNTER — Ambulatory Visit (INDEPENDENT_AMBULATORY_CARE_PROVIDER_SITE_OTHER): Payer: Medicare Other | Admitting: Podiatry

## 2019-01-06 DIAGNOSIS — B351 Tinea unguium: Secondary | ICD-10-CM | POA: Diagnosis not present

## 2019-01-06 DIAGNOSIS — I872 Venous insufficiency (chronic) (peripheral): Secondary | ICD-10-CM

## 2019-01-06 DIAGNOSIS — L84 Corns and callosities: Secondary | ICD-10-CM | POA: Diagnosis not present

## 2019-01-06 DIAGNOSIS — E119 Type 2 diabetes mellitus without complications: Secondary | ICD-10-CM | POA: Diagnosis not present

## 2019-01-06 DIAGNOSIS — M79676 Pain in unspecified toe(s): Secondary | ICD-10-CM

## 2019-01-06 NOTE — Progress Notes (Signed)
Complaint:  Visit Type: Patient returns to my office for continued preventative foot care services. Complaint: Patient states" my nails have grown long and thick and become painful to walk and wear shoes" Patient has been diagnosed with DM with no foot complications. The patient presents for preventative foot care services. No changes to ROS.  Patient presents to the office today with her niece.  Podiatric Exam: Vascular: dorsalis pedis are palpable bilateral.  Posterior tibial pulses are absent due to swelling.Capillary return is immediate. Temperature gradient is WNL. Skin turgor WNL  Sensorium: Normal Semmes Weinstein monofilament test. Normal tactile sensation bilaterally. Nail Exam: Pt has thick disfigured discolored nails with subungual debris noted bilateral entire nail hallux through fifth toenails Ulcer Exam: There is no evidence of ulcer or pre-ulcerative changes or infection. Orthopedic Exam: Muscle tone and strength are WNL. No limitations in general ROM. No crepitus or effusions noted. Foot type and digits show no abnormalities. Bony prominences are unremarkable. Hammer toes  B/L Skin: No Porokeratosis. No infection or ulcers.  Distal clavi 3rd toe right foot symptomatic.     Diagnosis:  Onychomycosis, , Pain in right toe, pain in left toes,  Clavi 3rd right.  Treatment & Plan Procedures and Treatment: Consent by patient was obtained for treatment procedures. The patient understood the discussion of treatment and procedures well. All questions were answered thoroughly reviewed. Debridement of mycotic and hypertrophic toenails, 1 through 5 bilateral and clearing of subungual debris. No ulceration, no infection noted.  Debride clavi. Return Visit-Office Procedure: Patient instructed to return to the office for a follow up visit 10 weeks  for continued evaluation and treatment.    Gardiner Barefoot DPM

## 2019-03-17 ENCOUNTER — Ambulatory Visit (INDEPENDENT_AMBULATORY_CARE_PROVIDER_SITE_OTHER): Payer: Medicare Other | Admitting: Podiatry

## 2019-03-17 ENCOUNTER — Encounter: Payer: Self-pay | Admitting: Podiatry

## 2019-03-17 ENCOUNTER — Other Ambulatory Visit: Payer: Self-pay

## 2019-03-17 DIAGNOSIS — L02619 Cutaneous abscess of unspecified foot: Secondary | ICD-10-CM | POA: Insufficient documentation

## 2019-03-17 DIAGNOSIS — M79676 Pain in unspecified toe(s): Secondary | ICD-10-CM

## 2019-03-17 DIAGNOSIS — E119 Type 2 diabetes mellitus without complications: Secondary | ICD-10-CM

## 2019-03-17 DIAGNOSIS — L84 Corns and callosities: Secondary | ICD-10-CM

## 2019-03-17 DIAGNOSIS — L03032 Cellulitis of left toe: Secondary | ICD-10-CM | POA: Diagnosis not present

## 2019-03-17 DIAGNOSIS — L03039 Cellulitis of unspecified toe: Secondary | ICD-10-CM | POA: Insufficient documentation

## 2019-03-17 DIAGNOSIS — B351 Tinea unguium: Secondary | ICD-10-CM

## 2019-03-17 DIAGNOSIS — L02612 Cutaneous abscess of left foot: Secondary | ICD-10-CM

## 2019-03-17 MED ORDER — DOXYCYCLINE HYCLATE 100 MG PO TABS: 100.0000 mg | ORAL_TABLET | Freq: Two times a day (BID) | ORAL | 0 refills | Status: DC

## 2019-03-17 NOTE — Progress Notes (Signed)
Complaint:  Visit Type: Patient returns to my office for continued preventative foot care services. Complaint: Patient states" my nails have grown long and thick and become painful to walk and wear shoes" Patient has been diagnosed with DM with no foot complications. The patient presents for preventative foot care services. No changes to ROS.  Patient presents to the office today with .her grandsons.  She says she has painful tip of her left big toe.  She says this area has been painful for about 2 weeks.    Podiatric Exam: Vascular: dorsalis pedis are palpable bilateral.  Posterior tibial pulses are absent due to swelling.Capillary return is immediate. Temperature gradient is WNL. Skin turgor WNL  Sensorium: Normal Semmes Weinstein monofilament test. Normal tactile sensation bilaterally. Nail Exam: Pt has thick disfigured discolored nails with subungual debris noted bilateral entire nail hallux through fifth toenails Ulcer Exam: There is no evidence of ulcer or pre-ulcerative changes or infection. Orthopedic Exam: Muscle tone and strength are WNL. No limitations in general ROM. No crepitus or effusions noted. Foot type and digits show no abnormalities. Bony prominences are unremarkable. Hammer toes  B/L Skin: No Porokeratosis. No infection or ulcers.  Distal clavi 3rd toe right foot symptomatic.   Fissure noted at distal aspect left hallux.  Fissure is in the center of callus left big toe.  No infection at fissure site.  Desquamation noted at proximal nail fold.  Diagnosis:  Onychomycosis, , Pain in right toe, pain in left toes,  Left hallux cellulitis.  Treatment & Plan Procedures and Treatment: Consent by patient was obtained for treatment procedures. The patient understood the discussion of treatment and procedures well. All questions were answered thoroughly reviewed. Debridement of mycotic and hypertrophic toenails, 1 through 5 bilateral and clearing of subungual debris. No ulceration, no  infection noted.  Debridement of callus left hallux distally.  Neosporin/DSD.  Prescribe doxycycline for this patient. Return Visit-Office Procedure: Patient instructed to return to the office for a follow up visit 10 weeks  for continued evaluation and treatment.    Gardiner Barefoot DPM

## 2019-03-19 ENCOUNTER — Emergency Department: Payer: Medicare Other

## 2019-03-19 ENCOUNTER — Other Ambulatory Visit: Payer: Self-pay

## 2019-03-19 ENCOUNTER — Encounter: Payer: Self-pay | Admitting: Emergency Medicine

## 2019-03-19 ENCOUNTER — Emergency Department
Admission: EM | Admit: 2019-03-19 | Discharge: 2019-03-19 | Disposition: A | Discharge status: Home / Self Care | Payer: Medicare Other | Attending: Emergency Medicine | Admitting: Emergency Medicine

## 2019-03-19 DIAGNOSIS — E119 Type 2 diabetes mellitus without complications: Secondary | ICD-10-CM | POA: Diagnosis not present

## 2019-03-19 DIAGNOSIS — Z79899 Other long term (current) drug therapy: Secondary | ICD-10-CM | POA: Insufficient documentation

## 2019-03-19 DIAGNOSIS — J449 Chronic obstructive pulmonary disease, unspecified: Secondary | ICD-10-CM | POA: Diagnosis not present

## 2019-03-19 DIAGNOSIS — I10 Essential (primary) hypertension: Secondary | ICD-10-CM | POA: Insufficient documentation

## 2019-03-19 DIAGNOSIS — F17210 Nicotine dependence, cigarettes, uncomplicated: Secondary | ICD-10-CM | POA: Diagnosis not present

## 2019-03-19 DIAGNOSIS — M7989 Other specified soft tissue disorders: Secondary | ICD-10-CM

## 2019-03-19 DIAGNOSIS — Z7984 Long term (current) use of oral hypoglycemic drugs: Secondary | ICD-10-CM | POA: Diagnosis not present

## 2019-03-19 DIAGNOSIS — I890 Lymphedema, not elsewhere classified: Secondary | ICD-10-CM | POA: Diagnosis not present

## 2019-03-19 DIAGNOSIS — R2243 Localized swelling, mass and lump, lower limb, bilateral: Secondary | ICD-10-CM | POA: Diagnosis present

## 2019-03-19 LAB — CBC WITH DIFFERENTIAL/PLATELET
Abs Immature Granulocytes: 0.01 10*3/uL (ref 0.00–0.07)
Basophils Absolute: 0.1 10*3/uL (ref 0.0–0.1)
Basophils Relative: 2 %
Eosinophils Absolute: 0.1 10*3/uL (ref 0.0–0.5)
Eosinophils Relative: 1 %
HCT: 31.4 % — ABNORMAL LOW (ref 36.0–46.0)
Hemoglobin: 10.3 g/dL — ABNORMAL LOW (ref 12.0–15.0)
Immature Granulocytes: 0 %
Lymphocytes Relative: 13 %
Lymphs Abs: 0.7 10*3/uL (ref 0.7–4.0)
MCH: 28.1 pg (ref 26.0–34.0)
MCHC: 32.8 g/dL (ref 30.0–36.0)
MCV: 85.8 fL (ref 80.0–100.0)
Monocytes Absolute: 0.6 10*3/uL (ref 0.1–1.0)
Monocytes Relative: 10 %
Neutro Abs: 4 10*3/uL (ref 1.7–7.7)
Neutrophils Relative %: 74 %
Platelets: 220 10*3/uL (ref 150–400)
RBC: 3.66 MIL/uL — ABNORMAL LOW (ref 3.87–5.11)
RDW: 12.8 % (ref 11.5–15.5)
WBC: 5.4 10*3/uL (ref 4.0–10.5)
nRBC: 0 % (ref 0.0–0.2)

## 2019-03-19 LAB — HEPATIC FUNCTION PANEL
ALT: 11 U/L (ref 0–44)
AST: 20 U/L (ref 15–41)
Albumin: 4 g/dL (ref 3.5–5.0)
Alkaline Phosphatase: 34 U/L — ABNORMAL LOW (ref 38–126)
Bilirubin, Direct: 0.2 mg/dL (ref 0.0–0.2)
Indirect Bilirubin: 0.8 mg/dL (ref 0.3–0.9)
Total Bilirubin: 1 mg/dL (ref 0.3–1.2)
Total Protein: 7.4 g/dL (ref 6.5–8.1)

## 2019-03-19 LAB — BASIC METABOLIC PANEL
Anion gap: 13 (ref 5–15)
BUN: 18 mg/dL (ref 8–23)
CO2: 25 mmol/L (ref 22–32)
Calcium: 9.9 mg/dL (ref 8.9–10.3)
Chloride: 91 mmol/L — ABNORMAL LOW (ref 98–111)
Creatinine, Ser: 1.07 mg/dL — ABNORMAL HIGH (ref 0.44–1.00)
GFR calc Af Amer: 58 mL/min — ABNORMAL LOW (ref >60)
GFR calc non Af Amer: 50 mL/min — ABNORMAL LOW (ref >60)
Glucose, Bld: 86 mg/dL (ref 70–99)
Potassium: 5.4 mmol/L — ABNORMAL HIGH (ref 3.5–5.1)
Sodium: 129 mmol/L — ABNORMAL LOW (ref 135–145)

## 2019-03-19 LAB — BRAIN NATRIURETIC PEPTIDE: B Natriuretic Peptide: 105 pg/mL — ABNORMAL HIGH (ref 0.0–100.0)

## 2019-03-19 MED ORDER — ACETAMINOPHEN 500 MG PO TABS
1000.0000 mg | ORAL_TABLET | Freq: Once | ORAL | Status: AC
  Administered 2019-03-19: 1000 mg via ORAL
  Filled 2019-03-19: qty 2

## 2019-03-19 MED ORDER — FUROSEMIDE 10 MG/ML IJ SOLN
40.0000 mg | Freq: Once | INTRAMUSCULAR | Status: AC
  Administered 2019-03-19: 18:00:00 40 mg via INTRAVENOUS
  Filled 2019-03-19: qty 4

## 2019-03-19 MED ORDER — FUROSEMIDE 40 MG PO TABS: 40.0000 mg | ORAL_TABLET | Freq: Two times a day (BID) | ORAL | 0 refills | Status: DC

## 2019-03-19 NOTE — ED Triage Notes (Signed)
Pt to ED via POV. Pt has bilateral lower leg swelling. Left worse than right. Pt has blisters on bilateral legs. Pt states that the swelling and blisters have been there x 2 months. Pt states that she has seen her PCP but he told her it was just fluid. Pt has some redness in the right lower leg.   Pt states that this morning and was not able to walk due to pain in her left leg. Pt is in NAD.

## 2019-03-19 NOTE — ED Provider Notes (Signed)
[facility] Emergency Department Provider Note  ____________________________________________   First MD Initiated Contact with Patient 03/19/19 1514     (approximate)  I have reviewed the triage vital signs and the nursing notes.   HISTORY  Chief Complaint Leg Swelling    HPI Kelly Hogan is a 79 y.o. female  With h/o asthma, HTN, DM, chronic venous insufficiency, here with bilateral leg pain and leg swelling with pain. Pt reports that over the past "few weeks," she's had acute on chronci worsening of lower extremity edema, L>R. She has known severe chronic venous insufficiency but does not regularly see a wound clinic or wear compression stockings. She reports that over the past day, she's had worsening aching, throbbing, severe diffuse L leg pain, though partially worse around the knee, that is limiting her ability to walk. She can only take tylenol due to h/o gastric ulcers. No known trauma or falls. No numbness or weakness. Pain is worse w/ weight bearing, palpation, movement. No alleviating factors.        Past Medical History:  Diagnosis Date  . Asthma   . COPD (chronic obstructive pulmonary disease) ([place])   . Diabetes mellitus without complication ([place])   . Hypertension   . Stomach ulcer     Patient Active Problem List   Diagnosis Date Noted  . Cellulitis and abscess of toe 03/17/2019  . Acute low back pain 05/22/2015  . Gastric ulcer 09/26/2014  . Osteopenia 09/07/2013  . H/O adenomatous polyp of colon 03/20/2013  . Chronic obstructive pulmonary disease ([place]) 07/05/2012  . Acid reflux 05/04/2012  . Chronic respiratory failure ([place]) 11/25/2010  . Chronic venous insufficiency 01/29/2010  . Type 2 diabetes mellitus ([place]) 11/07/2008  . Airway hyperreactivity 11/30/1996  . Essential (primary) hypertension 11/30/1996    History reviewed. No pertinent surgical history.  Prior to Admission medications   Medication Sig Start Date End  Date Taking? Authorizing Provider  acetaminophen (TYLENOL) 500 MG tablet Take 500 mg by mouth. 04/22/11   [provider]  amLODipine (NORVASC) 5 MG tablet Take 5 mg by mouth daily. 04/28/17   [provider]  azithromycin (ZITHROMAX) 250 MG tablet Take 250 mg by mouth daily. 03/19/18   [provider]  Blood Glucose Monitoring Suppl (GLUCOCOM BLOOD GLUCOSE MONITOR) DEVI Use to check blood sugars. ICD 9: 250.00. 02/02/18   [provider]  calcium carbonate (OS-CAL - DOSED IN MG OF ELEMENTAL CALCIUM) 1250 (500 Ca) MG tablet Take by mouth. 01/13/11   [provider]  calcium carbonate (TUMS EX) 750 MG chewable tablet Chew by mouth. 01/25/15   [provider]  carvedilol (COREG) 12.5 MG tablet Take by mouth. 10/22/18   [provider]  cetirizine (ZYRTEC) 10 MG tablet Take 10 mg by mouth. 01/30/17 01/30/18  [provider]  Cholecalciferol (VITAMIN D-1000 MAX ST) 1000 units tablet Take by mouth. 04/22/11   [provider]  cloNIDine (CATAPRES) 0.1 MG tablet Take 0.1 mg by mouth 3 (three) times daily.    [provider]  diclofenac sodium (VOLTAREN) 1 % GEL Apply topically. 07/20/18   [provider]  docusate sodium (COLACE) 100 MG capsule Take 100 mg by mouth 2 (two) times daily. 04/06/16   [provider]  doxycycline (VIBRA-TABS) 100 MG tablet Take 1 tablet (100 mg total) by mouth 2 (two) times daily. 03/17/19   Gardiner Barefoot, DPM  enalapril (VASOTEC) 20 MG tablet Take 20 mg by mouth daily.  [provider]  fexofenadine (ALLEGRA) 180 MG tablet Take 180 mg by mouth daily. 11/24/17   [provider]  fluticasone (FLONASE) 50 MCG/ACT nasal spray SPRAY 1 SPRAY INTO EACH NOSTRIL EVERY DAY 04/01/18   [provider]  Fluticasone-Salmeterol (ADVAIR) 500-50 MCG/DOSE AEPB Inhale into the lungs. 03/16/18 03/16/19  [provider]  furosemide (LASIX) 40 MG tablet 1 TAB TWICE A  DAY FOR 3 DAYS, THEN ONCE A DAY THEREAFTER. ADJUST ACCORDING TO FUTURE RECOMMENDATIONS 11/09/18   [provider]  furosemide (LASIX) 40 MG tablet Take 1 tablet (40 mg total) by mouth 2 (two) times daily for 3 days. 03/19/19 03/22/19  Shaune Pollack, MD  glucose blood ([employer] TEST STRIPS) test strip Test blood sugars one time daily. Please dispense Accu Chek. ICD-10 E11.9 05/14/15   [provider]  losartan (COZAAR) 100 MG tablet TAKE 1 TABLET (100 MG TOTAL) BY MOUTH DAILY. 02/12/18   [provider]  mupirocin ointment (BACTROBAN) 2 % APPLY TOPICALLY THREE (3) TIMES A DAY FOR 7 DAYS. 06/07/18   [provider]  omeprazole (PRILOSEC) 20 MG capsule Take 20 mg by mouth. 05/14/17 05/14/18  [provider]  Ethelda Chick Calcium 500 MG TABS Take by mouth. 01/13/11   [provider]  polyethylene glycol powder (GLYCOLAX/MIRALAX) powder  04/06/13   [provider]  PROAIR HFA 108 [phone] Base) MCG/ACT inhaler  02/14/16   [provider]  Psyllium 400 MG CAPS Take by mouth. 10/27/17   [provider]  silver sulfADIAZINE (SILVADENE) 1 % cream APPLY TO AFFECTED AREA EVERY DAY 06/07/18   [provider]  simvastatin (ZOCOR) 10 MG tablet Take 10 mg by mouth daily.    [provider]  spironolactone (ALDACTONE) 25 MG tablet Take 25 mg by mouth daily. 10/17/17   [provider]  telmisartan (MICARDIS) 80 MG tablet Take 80 mg by mouth daily. 05/21/18   [provider]  tiotropium (SPIRIVA) 18 MCG inhalation capsule Place 18 mcg into inhaler and inhale daily.    [provider]  UNABLE TO FIND Take by mouth. 01/13/11   [provider]  UNABLE TO FIND Check Blood Sugar Daily E11.9 10/26/18   [provider]    Allergies Enalapril, Oxycodone-acetaminophen, Oxycodone-acetaminophen, Ranitidine hcl, Aspirin, Hydrochlorothiazide, Penicillins, and Ranitidine hcl  No family history on  file.  Social History Social History   Tobacco Use  . Smoking status: Current Some Day Smoker  . Smokeless tobacco: Never Used  Substance Use Topics  . Alcohol use: No  . Drug use: No    Review of Systems  Review of Systems  Constitutional: Positive for fatigue. Negative for chills and fever.  HENT: Negative for sore throat.   Respiratory: Negative for shortness of breath.   Cardiovascular: Positive for leg swelling. Negative for chest pain.  Gastrointestinal: Negative for abdominal pain.  Genitourinary: Negative for flank pain.  Musculoskeletal: Positive for arthralgias and gait problem. Negative for neck pain.  Skin: Negative for rash and wound.  Allergic/Immunologic: Negative for immunocompromised state.  Neurological: Negative for weakness and numbness.  Hematological: Does not bruise/bleed easily.  All other systems reviewed and are negative.    ____________________________________________  PHYSICAL EXAM:      VITAL SIGNS: ED Triage Vitals  Enc Vitals Group     BP 03/19/19 1123 (!) 187/79     Pulse Rate 03/19/19 1123 65     Resp 03/19/19 1123 16     Temp 03/19/19 1123 97.8 F (  36.6 C)     Temp Source 03/19/19 1123 Oral     SpO2 03/19/19 1123 100 %     Weight 03/19/19 1124 (!) 315 lb (142.9 kg)     Height 03/19/19 1124 5\' 4"  (1.626 m)     Head Circumference --      Peak Flow --      Pain Score 03/19/19 1123 10     Pain Loc --      Pain Edu? --      Excl. in GC? --      Physical Exam Vitals and nursing note reviewed.  Constitutional:      General: She is not in acute distress.    Appearance: She is well-developed.  HENT:     Head: Normocephalic and atraumatic.  Eyes:     Conjunctiva/sclera: Conjunctivae normal.  Cardiovascular:     Rate and Rhythm: Normal rate and regular rhythm.     Heart sounds: Normal heart sounds. No murmur. No friction rub.  Pulmonary:     Effort: Pulmonary effort is normal. No respiratory distress.     Breath sounds: Normal  breath sounds. No wheezing or rales.  Abdominal:     General: There is no distension.     Palpations: Abdomen is soft.     Tenderness: There is no abdominal tenderness.  Musculoskeletal:     Cervical back: Neck supple.     Right lower leg: Edema present.     Left lower leg: Edema present.     Comments: 3+ pitting edema bilaterally with lymphedema, marked chronic skin changes and secondary skin blistering. No overt warmth, erythema, or induration. No appreciable joint swelling or warmth.  Skin:    General: Skin is warm.     Capillary Refill: Capillary refill takes less than 2 seconds.  Neurological:     Mental Status: She is alert and oriented to person, place, and time.     Motor: No abnormal muscle tone.       ____________________________________________   LABS (all labs ordered are listed, but only abnormal results are displayed)  Labs Reviewed  CBC WITH DIFFERENTIAL/PLATELET - Abnormal; Notable for the following components:      Result Value   RBC 3.66 (*)    Hemoglobin 10.3 (*)    HCT 31.4 (*)    All other components within normal limits  BASIC METABOLIC PANEL - Abnormal; Notable for the following components:   Sodium 129 (*)    Potassium 5.4 (*)    Chloride 91 (*)    Creatinine, Ser 1.07 (*)    GFR calc non Af Amer 50 (*)    GFR calc Af Amer 58 (*)    All other components within normal limits  BRAIN NATRIURETIC PEPTIDE - Abnormal; Notable for the following components:   B Natriuretic Peptide 105.0 (*)    All other components within normal limits  HEPATIC FUNCTION PANEL - Abnormal; Notable for the following components:   Alkaline Phosphatase 34 (*)    All other components within normal limits    ____________________________________________  EKG: None ________________________________________  RADIOLOGY All imaging, including plain films, CT scans, and ultrasounds, independently reviewed by me, and interpretations confirmed via formal radiology reads.  ED MD  interpretation:   CXR: Clear, no CHF U/S LE: No obvious DVT  Official radiology report(s): DG Chest 2 View  Result Date: 03/19/2019 CLINICAL DATA:  Evaluate for CHF. Lower leg swelling. EXAM: CHEST - 2 VIEW COMPARISON:  10/16/12 FINDINGS: Mild cardiac enlargement.  The mediastinal contours are within normal limits. Both lungs are clear. The visualized skeletal structures are unremarkable. IMPRESSION: 1. No active cardiopulmonary abnormalities. Electronically Signed   By: Signa Kellaylor  Stroud M.D.   On: 03/19/2019 14:53   US Venous Img Lower Unilateral Left  Result Date: 03/19/2019 CLINICAL DATA:  Pain and swelling for 2 months EXAM: LEFT LOWER EXTREMITY VENOUS DOPPLER ULTRASOUND TECHNIQUE: Gray-scale sonography with graded compression, as well as color Doppler and duplex ultrasound were performed to evaluate the lower extremity deep venous systems from the level of the common femoral vein and including the common femoral, femoral, profunda femoral, popliteal and calf veins including the posterior tibial, peroneal and gastrocnemius veins when visible. The superficial great saphenous vein was also interrogated. Spectral Doppler was utilized to evaluate flow at rest and with distal augmentation maneuvers in the common femoral, femoral and popliteal veins. COMPARISON:  None. FINDINGS: Contralateral Common Femoral Vein: Respiratory phasicity is normal and symmetric with the symptomatic side. No evidence of thrombus. Normal compressibility. Common Femoral Vein: No evidence of thrombus. Normal compressibility, respiratory phasicity and response to augmentation. Saphenofemoral Junction: No evidence of thrombus. Normal compressibility and flow on color Doppler imaging. Profunda Femoral Vein: No evidence of thrombus. Normal compressibility and flow on color Doppler imaging. Femoral Vein: No evidence of thrombus. Normal compressibility, respiratory phasicity and response to augmentation. Popliteal Vein: No evidence of  thrombus. Normal compressibility, respiratory phasicity and response to augmentation. Calf Veins: Calf veins not visualized. Limited because of peripheral edema and body habitus. IMPRESSION: No significant femoropopliteal DVT demonstrated. Calf veins not visualized. Electronically Signed   By: Judie PetitM.  Shick M.D.   On: 03/19/2019 13:10    ____________________________________________  PROCEDURES   Procedure(s) performed (including Critical Care):  Procedures  ____________________________________________  INITIAL IMPRESSION / MDM / ASSESSMENT AND PLAN / ED COURSE  As part of my medical decision making, I reviewed the following data within the electronic MEDICAL RECORD NUMBER Nursing notes reviewed and incorporated, Old chart reviewed, Notes from prior ED visits, and [place] Controlled Substance Database       *Kelly Hogan was evaluated in Emergency Department on 03/19/2019 for the symptoms described in the history of present illness. She was evaluated in the context of the global COVID-19 pandemic, which necessitated consideration that the patient might be at risk for infection with the SARS-CoV-2 virus that causes COVID-19. Institutional protocols and algorithms that pertain to the evaluation of patients at risk for COVID-19 are in a state of rapid change based on information released by regulatory bodies including the CDC and federal and state organizations. These policies and algorithms were followed during the patient's care in the ED.  Some ED evaluations and interventions may be delayed as a result of limited staffing during the pandemic.*  Clinical Course as of Mar 18 2041  Sat Mar 19, 2019  1727 79 yo F here with leg swelling, pain. Exam is consistent with lymphedema/chronic venous insufficiency w/ secondary skin changes. She has some superficial ulceration likely 2/2 edema but no signs of cellulitis or infection. Her lungs are clear. She has venous stasis changes but no skin breakdown. Will wrap  legs w/ compression dressing, start lasix, and reassess. Would benefit immensely from outpt wound nursing/nursing care for pressure wraps.   [CI]  1735 Suspect her hyponatremia is primarily hypervolemic in nature. She has only mild worsening compared to baseline. Will monitor as outpt.   [CI]  1952 Legs wrapped, lasix given. Pt feels markedly improved, is able to ambulate  at her baseline. Will have her f/u with wound clinic as an outpt, increase her lasix over the next few days.   [CI]    Clinical Course User Index [CI] Shaune Pollack, MD    Medical Decision Making:  As above. Likely dependent edema. Her hyponatremia is asymptomatic, acute on chronic and likely hypervolemic. Diuresing well, feels better, would like to return home which is reasonable. SW consulted to see if she can get home health. Lasix increased at home for 3 days.  ____________________________________________  FINAL CLINICAL IMPRESSION(S) / ED DIAGNOSES  Final diagnoses:  Lymphedema  Leg swelling     MEDICATIONS GIVEN DURING THIS VISIT:  Medications  furosemide (LASIX) injection 40 mg (40 mg Intravenous Given 03/19/19 1750)  acetaminophen (TYLENOL) tablet 1,000 mg (1,000 mg Oral Given 03/19/19 1727)     ED Discharge Orders         Ordered    furosemide (LASIX) 40 MG tablet  2 times daily     03/19/19 2010           Note:  This document was prepared using Dragon voice recognition software and may include unintentional dictation errors.   Shaune Pollack, MD 03/19/19 2043

## 2019-03-19 NOTE — ED Notes (Signed)
Pt given phone to contact family for transportation upon discharge.

## 2019-03-19 NOTE — ED Notes (Signed)
This RN assisted pt to the bathroom. Changed pt's sheets and placed clean brief on pt. Pt's leg dressings were soiled and clean dressing was applied.

## 2019-03-19 NOTE — ED Notes (Signed)
Pt on phone w/ son about transportation. Son states he has pt's oxygen tank and will be back to transport pt for discharge.

## 2019-03-19 NOTE — Discharge Instructions (Addendum)
Your pain today is due to severe swelling in your legs.  For now, we are going to INCREASE your lasix from 40 mg once a day to 40 mg twice a day. Do this for 3 days then go back to your usual dose.  I've asked social work to call you. I'd recommend getting in with a home health nurse or wound care clinic to wrap your legs with COMPRESSION DRESSINGS to help with the swelling and pain.

## 2019-03-19 NOTE — ED Notes (Addendum)
Pt ambulated w/ walker around room by this RN and Jeannett Senior, Charity fundraiser. Pt stating she feels the same as when she is at home. Pt stating she feels good to go home. Pt denies increased SOB. Oxygen saturation remained between 95-100% on 4L [place].

## 2019-03-21 ENCOUNTER — Telehealth: Payer: Self-pay

## 2019-03-21 NOTE — Telephone Encounter (Signed)
RN CM received incoming call from patient stating MD treated her Saturday in the emergency department and was going to order her home health care for continued wound care. Callback number for patient is [phone].

## 2019-03-22 NOTE — Telephone Encounter (Signed)
RN CM outreached to all available HHC agencies and no agency able to accept patient for wound care. Confirmed with Sissy Hoff and [facility]. Called to [place], [place] Mgr @ Dr. Madelon Lips office and updated as to inability to set up home health for patient. Per Lawanna Kobus patient can be seen by Dr. Madelon Lips and they will arrange adjusted wound care for the office. Transferred to scheduler and confirmed patient has telephone appointment today with Dr. Madelon Lips and in person appointment on Friday of this week. Updated patient of inability to arrange HHC. Patient verbalized understanding and states she can work out something for transportation to and from MD appointment. No further needs at this time.

## 2019-05-26 ENCOUNTER — Other Ambulatory Visit: Payer: Self-pay

## 2019-05-26 ENCOUNTER — Ambulatory Visit (INDEPENDENT_AMBULATORY_CARE_PROVIDER_SITE_OTHER): Payer: Medicare Other | Admitting: Podiatry

## 2019-05-26 ENCOUNTER — Encounter: Payer: Self-pay | Admitting: Podiatry

## 2019-05-26 VITALS — Temp 98.1°F

## 2019-05-26 DIAGNOSIS — E119 Type 2 diabetes mellitus without complications: Secondary | ICD-10-CM | POA: Diagnosis not present

## 2019-05-26 DIAGNOSIS — B351 Tinea unguium: Secondary | ICD-10-CM

## 2019-05-26 DIAGNOSIS — I872 Venous insufficiency (chronic) (peripheral): Secondary | ICD-10-CM | POA: Diagnosis not present

## 2019-05-26 DIAGNOSIS — L84 Corns and callosities: Secondary | ICD-10-CM

## 2019-05-26 DIAGNOSIS — M79676 Pain in unspecified toe(s): Secondary | ICD-10-CM

## 2019-05-26 NOTE — Progress Notes (Signed)
This patient returns to my office for at risk foot care.  This patient requires this care by a professional since this patient will be at risk due to having  Diabetes and venous insufficiency.  Cellulitis has resolved left hallux.  This patient is unable to cut nails herself  since the patient cannot reach their nails.These nails are painful walking and wearing shoes.  This patient presents for at risk foot care today. She presents to the office with her niece. Patient is wearing unna boots both feet.  General Appearance  Alert, conversant and in no acute stress.  Vascular  deferred  Neurologic  deferred  Nails Thick disfigured discolored nails with subungual debris  from hallux to fifth toes bilaterally. No evidence of bacterial infection or drainage bilaterally.  Orthopedic  No limitations of motion  feet .  No crepitus or effusions noted.  No bony pathology or digital deformities noted.  Skin  normotropic skin with no porokeratosis noted bilaterally.  No signs of infections or ulcers noted.   Clavi third toe right foot.  Onychomycosis  Pain in right toes  Pain in left toes  Consent was obtained for treatment procedures.   Mechanical debridement of nails 1-5  bilaterally performed with a nail nipper.  Filed with dremel without incident. No infection or ulcer.     Return office visit   10 weeks       Told patient to return for periodic foot care and evaluation due to potential at risk complications.   Helane Gunther DPM

## 2019-06-04 ENCOUNTER — Ambulatory Visit: Payer: Medicare Other | Attending: Internal Medicine

## 2019-06-04 DIAGNOSIS — Z23 Encounter for immunization: Secondary | ICD-10-CM

## 2019-06-04 NOTE — Progress Notes (Signed)
   Covid-19 Vaccination Clinic  Name:  KATHLEE BARNHARDT    MRN: [id number] DOB: 1940/03/19  06/04/2019  Ms. [place]halgh was observed post Covid-19 immunization for 15 minutes without incident. She was provided with Vaccine Information Sheet and instruction to access the V-Safe system.   Ms. Gawronski was instructed to call 911 with any severe reactions post vaccine: Marland Kitchen Difficulty breathing  . Swelling of face and throat  . A fast heartbeat  . A bad rash all over body  . Dizziness and weakness   Immunizations Administered    Name Date Dose VIS Date Route   Pfizer COVID-19 Vaccine 06/04/2019 12:06 PM 0.3 mL 02/25/2019 Intramuscular   Manufacturer: [employer], [employer]   Lot: [id number]   NDC: [address]-5

## 2019-06-25 ENCOUNTER — Ambulatory Visit: Payer: Medicare Other | Attending: Internal Medicine

## 2019-06-25 DIAGNOSIS — Z23 Encounter for immunization: Secondary | ICD-10-CM

## 2019-06-25 NOTE — Progress Notes (Signed)
   Covid-19 Vaccination Clinic  Name:  Kelly Hogan    MRN: [id number] DOB: [birth year]  06/25/2019  [name] was observed post Covid-19 immunization for 15 minutes without incident. She was provided with Vaccine Information Sheet and instruction to access the V-Safe system.   [name] was instructed to call 911 with any severe reactions post vaccine: Marland Kitchen Difficulty breathing  . Swelling of face and throat  . A fast heartbeat  . A bad rash all over body  . Dizziness and weakness   Immunizations Administered    Name Date Dose VIS Date Route   Pfizer COVID-19 Vaccine 06/25/2019 12:10 PM 0.3 mL 02/25/2019 Intramuscular   Manufacturer: [employer], [employer]   Lot: [id number]   NDC: [address]-7

## 2019-07-07 ENCOUNTER — Emergency Department: Payer: Medicare Other

## 2019-07-07 ENCOUNTER — Encounter: Payer: Self-pay | Admitting: Emergency Medicine

## 2019-07-07 ENCOUNTER — Inpatient Hospital Stay
Admission: EM | Admit: 2019-07-07 | Discharge: 2019-07-18 | DRG: 190 | Disposition: A | Discharge status: Home Health | Payer: Medicare Other | Attending: Family Medicine | Admitting: Family Medicine

## 2019-07-07 ENCOUNTER — Other Ambulatory Visit: Payer: Self-pay

## 2019-07-07 DIAGNOSIS — Z6841 Body Mass Index (BMI) 40.0 and over, adult: Secondary | ICD-10-CM

## 2019-07-07 DIAGNOSIS — D649 Anemia, unspecified: Secondary | ICD-10-CM | POA: Diagnosis not present

## 2019-07-07 DIAGNOSIS — I10 Essential (primary) hypertension: Secondary | ICD-10-CM | POA: Diagnosis present

## 2019-07-07 DIAGNOSIS — R600 Localized edema: Secondary | ICD-10-CM | POA: Diagnosis present

## 2019-07-07 DIAGNOSIS — E1122 Type 2 diabetes mellitus with diabetic chronic kidney disease: Secondary | ICD-10-CM | POA: Diagnosis present

## 2019-07-07 DIAGNOSIS — K219 Gastro-esophageal reflux disease without esophagitis: Secondary | ICD-10-CM | POA: Diagnosis present

## 2019-07-07 DIAGNOSIS — E222 Syndrome of inappropriate secretion of antidiuretic hormone: Secondary | ICD-10-CM | POA: Diagnosis present

## 2019-07-07 DIAGNOSIS — J9611 Chronic respiratory failure with hypoxia: Secondary | ICD-10-CM | POA: Diagnosis present

## 2019-07-07 DIAGNOSIS — J9621 Acute and chronic respiratory failure with hypoxia: Secondary | ICD-10-CM | POA: Diagnosis not present

## 2019-07-07 DIAGNOSIS — G4733 Obstructive sleep apnea (adult) (pediatric): Secondary | ICD-10-CM | POA: Diagnosis not present

## 2019-07-07 DIAGNOSIS — Z720 Tobacco use: Secondary | ICD-10-CM | POA: Diagnosis not present

## 2019-07-07 DIAGNOSIS — Z7982 Long term (current) use of aspirin: Secondary | ICD-10-CM

## 2019-07-07 DIAGNOSIS — E662 Morbid (severe) obesity with alveolar hypoventilation: Secondary | ICD-10-CM | POA: Diagnosis present

## 2019-07-07 DIAGNOSIS — E875 Hyperkalemia: Secondary | ICD-10-CM | POA: Diagnosis present

## 2019-07-07 DIAGNOSIS — D631 Anemia in chronic kidney disease: Secondary | ICD-10-CM | POA: Diagnosis present

## 2019-07-07 DIAGNOSIS — Z8711 Personal history of peptic ulcer disease: Secondary | ICD-10-CM | POA: Diagnosis not present

## 2019-07-07 DIAGNOSIS — J449 Chronic obstructive pulmonary disease, unspecified: Secondary | ICD-10-CM | POA: Diagnosis present

## 2019-07-07 DIAGNOSIS — I129 Hypertensive chronic kidney disease with stage 1 through stage 4 chronic kidney disease, or unspecified chronic kidney disease: Secondary | ICD-10-CM | POA: Diagnosis present

## 2019-07-07 DIAGNOSIS — J9601 Acute respiratory failure with hypoxia: Secondary | ICD-10-CM | POA: Diagnosis not present

## 2019-07-07 DIAGNOSIS — Z20822 Contact with and (suspected) exposure to covid-19: Secondary | ICD-10-CM | POA: Diagnosis present

## 2019-07-07 DIAGNOSIS — Z7951 Long term (current) use of inhaled steroids: Secondary | ICD-10-CM | POA: Diagnosis not present

## 2019-07-07 DIAGNOSIS — N1831 Chronic kidney disease, stage 3a: Secondary | ICD-10-CM | POA: Diagnosis present

## 2019-07-07 DIAGNOSIS — Z8249 Family history of ischemic heart disease and other diseases of the circulatory system: Secondary | ICD-10-CM | POA: Diagnosis not present

## 2019-07-07 DIAGNOSIS — E785 Hyperlipidemia, unspecified: Secondary | ICD-10-CM | POA: Diagnosis present

## 2019-07-07 DIAGNOSIS — J441 Chronic obstructive pulmonary disease with (acute) exacerbation: Secondary | ICD-10-CM | POA: Diagnosis present

## 2019-07-07 DIAGNOSIS — Z79899 Other long term (current) drug therapy: Secondary | ICD-10-CM

## 2019-07-07 DIAGNOSIS — F17200 Nicotine dependence, unspecified, uncomplicated: Secondary | ICD-10-CM | POA: Diagnosis present

## 2019-07-07 DIAGNOSIS — E871 Hypo-osmolality and hyponatremia: Secondary | ICD-10-CM | POA: Diagnosis not present

## 2019-07-07 DIAGNOSIS — J9610 Chronic respiratory failure, unspecified whether with hypoxia or hypercapnia: Secondary | ICD-10-CM | POA: Diagnosis present

## 2019-07-07 DIAGNOSIS — R531 Weakness: Secondary | ICD-10-CM

## 2019-07-07 DIAGNOSIS — E1129 Type 2 diabetes mellitus with other diabetic kidney complication: Secondary | ICD-10-CM | POA: Insufficient documentation

## 2019-07-07 DIAGNOSIS — N183 Chronic kidney disease, stage 3 unspecified: Secondary | ICD-10-CM | POA: Diagnosis present

## 2019-07-07 LAB — BASIC METABOLIC PANEL
Anion gap: 7 (ref 5–15)
Anion gap: 7 (ref 5–15)
Anion gap: 8 (ref 5–15)
BUN: 27 mg/dL — ABNORMAL HIGH (ref 8–23)
BUN: 24 mg/dL — ABNORMAL HIGH (ref 8–23)
BUN: 23 mg/dL (ref 8–23)
CO2: 28 mmol/L (ref 22–32)
CO2: 27 mmol/L (ref 22–32)
CO2: 27 mmol/L (ref 22–32)
Calcium: 9.6 mg/dL (ref 8.9–10.3)
Calcium: 9.6 mg/dL (ref 8.9–10.3)
Calcium: 9.7 mg/dL (ref 8.9–10.3)
Chloride: 88 mmol/L — ABNORMAL LOW (ref 98–111)
Chloride: 91 mmol/L — ABNORMAL LOW (ref 98–111)
Chloride: 91 mmol/L — ABNORMAL LOW (ref 98–111)
Creatinine, Ser: 1.24 mg/dL — ABNORMAL HIGH (ref 0.44–1.00)
Creatinine, Ser: 1.15 mg/dL — ABNORMAL HIGH (ref 0.44–1.00)
Creatinine, Ser: 1 mg/dL (ref 0.44–1.00)
GFR calc Af Amer: 49 mL/min — ABNORMAL LOW (ref >60)
GFR calc Af Amer: 53 mL/min — ABNORMAL LOW (ref >60)
GFR calc Af Amer: >60 mL/min (ref >60)
GFR calc non Af Amer: 42 mL/min — ABNORMAL LOW (ref >60)
GFR calc non Af Amer: 46 mL/min — ABNORMAL LOW (ref >60)
GFR calc non Af Amer: 54 mL/min — ABNORMAL LOW (ref >60)
Glucose, Bld: 90 mg/dL (ref 70–99)
Glucose, Bld: 107 mg/dL — ABNORMAL HIGH (ref 70–99)
Glucose, Bld: 121 mg/dL — ABNORMAL HIGH (ref 70–99)
Potassium: 5.2 mmol/L — ABNORMAL HIGH (ref 3.5–5.1)
Potassium: 5 mmol/L (ref 3.5–5.1)
Potassium: 5.4 mmol/L — ABNORMAL HIGH (ref 3.5–5.1)
Sodium: 123 mmol/L — ABNORMAL LOW (ref 135–145)
Sodium: 125 mmol/L — ABNORMAL LOW (ref 135–145)
Sodium: 126 mmol/L — ABNORMAL LOW (ref 135–145)

## 2019-07-07 LAB — CBC
HCT: 30.7 % — ABNORMAL LOW (ref 36.0–46.0)
Hemoglobin: 9.4 g/dL — ABNORMAL LOW (ref 12.0–15.0)
MCH: 27.5 pg (ref 26.0–34.0)
MCHC: 30.6 g/dL (ref 30.0–36.0)
MCV: 89.8 fL (ref 80.0–100.0)
Platelets: 205 10*3/uL (ref 150–400)
RBC: 3.42 MIL/uL — ABNORMAL LOW (ref 3.87–5.11)
RDW: 12.7 % (ref 11.5–15.5)
WBC: 4.4 10*3/uL (ref 4.0–10.5)
nRBC: 0 % (ref 0.0–0.2)

## 2019-07-07 LAB — URINALYSIS, COMPLETE (UACMP) WITH MICROSCOPIC
Bacteria, UA: NONE SEEN
Bilirubin Urine: NEGATIVE
Glucose, UA: NEGATIVE mg/dL
Hgb urine dipstick: NEGATIVE
Ketones, ur: NEGATIVE mg/dL
Leukocytes,Ua: NEGATIVE
Nitrite: NEGATIVE
Protein, ur: NEGATIVE mg/dL
Specific Gravity, Urine: 1.008 (ref 1.005–1.030)
pH: 6 (ref 5.0–8.0)

## 2019-07-07 LAB — RESPIRATORY PANEL BY RT PCR (FLU A&B, COVID)
Influenza A by PCR: NEGATIVE
Influenza B by PCR: NEGATIVE
SARS Coronavirus 2 by RT PCR: NEGATIVE

## 2019-07-07 LAB — OSMOLALITY, URINE: Osmolality, Ur: 299 mOsm/kg — ABNORMAL LOW (ref 300–900)

## 2019-07-07 LAB — BRAIN NATRIURETIC PEPTIDE: B Natriuretic Peptide: 129 pg/mL — ABNORMAL HIGH (ref 0.0–100.0)

## 2019-07-07 LAB — TROPONIN I (HIGH SENSITIVITY): Troponin I (High Sensitivity): 3 ng/L (ref <18)

## 2019-07-07 LAB — SODIUM, URINE, RANDOM: Sodium, Ur: 70 mmol/L

## 2019-07-07 LAB — OSMOLALITY: Osmolality: 271 mOsm/kg — ABNORMAL LOW (ref 275–295)

## 2019-07-07 MED ORDER — IPRATROPIUM BROMIDE HFA 17 MCG/ACT IN AERS: 2.0000 | INHALATION_SPRAY | RESPIRATORY_TRACT | Status: DC

## 2019-07-07 MED ORDER — ASPIRIN EC 81 MG PO TBEC
81.0000 mg | DELAYED_RELEASE_TABLET | Freq: Every day | ORAL | Status: DC
  Administered 2019-07-08 – 2019-07-18 (×11): 81 mg via ORAL
  Filled 2019-07-07 (×14): qty 1

## 2019-07-07 MED ORDER — HYDRALAZINE HCL 20 MG/ML IJ SOLN
5.0000 mg | INTRAMUSCULAR | Status: DC | PRN
  Administered 2019-07-10 – 2019-07-11 (×2): 5 mg via INTRAVENOUS
  Filled 2019-07-07 (×2): qty 1

## 2019-07-07 MED ORDER — HYDRALAZINE HCL 50 MG PO TABS
25.0000 mg | ORAL_TABLET | Freq: Two times a day (BID) | ORAL | Status: DC
  Administered 2019-07-07 – 2019-07-17 (×21): 25 mg via ORAL
  Filled 2019-07-07 (×21): qty 1

## 2019-07-07 MED ORDER — SODIUM CHLORIDE 0.9 % IV SOLN: Freq: Once | INTRAVENOUS | Status: DC

## 2019-07-07 MED ORDER — FLUTICASONE-UMECLIDIN-VILANT 100-62.5-25 MCG/INH IN AEPB: 1.0000 | INHALATION_SPRAY | Freq: Every day | RESPIRATORY_TRACT | Status: DC

## 2019-07-07 MED ORDER — FLUTICASONE PROPIONATE 50 MCG/ACT NA SUSP: 1.0000 | Freq: Every day | NASAL | Status: DC

## 2019-07-07 MED ORDER — ALBUTEROL SULFATE (2.5 MG/3ML) 0.083% IN NEBU: 2.5000 mg | INHALATION_SOLUTION | RESPIRATORY_TRACT | Status: DC | PRN

## 2019-07-07 MED ORDER — SIMVASTATIN 20 MG PO TABS
10.0000 mg | ORAL_TABLET | Freq: Every day | ORAL | Status: DC
  Administered 2019-07-07 – 2019-07-17 (×11): 10 mg via ORAL
  Filled 2019-07-07 (×11): qty 1

## 2019-07-07 MED ORDER — PANTOPRAZOLE SODIUM 40 MG PO TBEC
40.0000 mg | DELAYED_RELEASE_TABLET | Freq: Every day | ORAL | Status: DC
  Administered 2019-07-07 – 2019-07-18 (×12): 40 mg via ORAL
  Filled 2019-07-07 (×12): qty 1

## 2019-07-07 MED ORDER — ACETAMINOPHEN 500 MG PO TABS
500.0000 mg | ORAL_TABLET | Freq: Four times a day (QID) | ORAL | Status: DC | PRN
  Administered 2019-07-07 – 2019-07-17 (×4): 500 mg via ORAL
  Filled 2019-07-07 (×4): qty 1

## 2019-07-07 MED ORDER — METHYLPREDNISOLONE SODIUM SUCC 125 MG IJ SOLR
60.0000 mg | Freq: Two times a day (BID) | INTRAMUSCULAR | Status: DC
  Administered 2019-07-07 – 2019-07-12 (×10): 60 mg via INTRAVENOUS
  Filled 2019-07-07 (×10): qty 2

## 2019-07-07 MED ORDER — IRBESARTAN 150 MG PO TABS
300.0000 mg | ORAL_TABLET | Freq: Every day | ORAL | Status: DC
  Administered 2019-07-07: 300 mg via ORAL
  Filled 2019-07-07 (×2): qty 2

## 2019-07-07 MED ORDER — ENOXAPARIN SODIUM 40 MG/0.4ML ~~LOC~~ SOLN
40.0000 mg | Freq: Two times a day (BID) | SUBCUTANEOUS | Status: DC
  Administered 2019-07-07 – 2019-07-18 (×22): 40 mg via SUBCUTANEOUS
  Filled 2019-07-07 (×22): qty 0.4

## 2019-07-07 MED ORDER — CARVEDILOL 12.5 MG PO TABS
12.5000 mg | ORAL_TABLET | Freq: Two times a day (BID) | ORAL | Status: DC
  Administered 2019-07-08 – 2019-07-18 (×21): 12.5 mg via ORAL
  Filled 2019-07-07 (×2): qty 1
  Filled 2019-07-07: qty 4
  Filled 2019-07-07 (×4): qty 1
  Filled 2019-07-07 (×2): qty 4
  Filled 2019-07-07 (×3): qty 1
  Filled 2019-07-07: qty 4
  Filled 2019-07-07: qty 1
  Filled 2019-07-07: qty 4
  Filled 2019-07-07 (×2): qty 1
  Filled 2019-07-07 (×2): qty 4
  Filled 2019-07-07: qty 1
  Filled 2019-07-07 (×2): qty 4
  Filled 2019-07-07 (×2): qty 1
  Filled 2019-07-07: qty 4
  Filled 2019-07-07 (×3): qty 1

## 2019-07-07 MED ORDER — DOCUSATE SODIUM 100 MG PO CAPS
100.0000 mg | ORAL_CAPSULE | Freq: Two times a day (BID) | ORAL | Status: DC
  Administered 2019-07-07 – 2019-07-18 (×20): 100 mg via ORAL
  Filled 2019-07-07 (×22): qty 1

## 2019-07-07 MED ORDER — NICOTINE 21 MG/24HR TD PT24
21.0000 mg | MEDICATED_PATCH | Freq: Every day | TRANSDERMAL | Status: DC
  Filled 2019-07-07: qty 1

## 2019-07-07 MED ORDER — FLUTICASONE PROPIONATE 50 MCG/ACT NA SUSP
1.0000 | Freq: Every day | NASAL | Status: DC
  Administered 2019-07-08 – 2019-07-18 (×11): 1 via NASAL
  Filled 2019-07-07: qty 16

## 2019-07-07 MED ORDER — IPRATROPIUM BROMIDE 0.02 % IN SOLN
0.5000 mg | RESPIRATORY_TRACT | Status: DC
  Administered 2019-07-07 – 2019-07-08 (×5): 0.5 mg via RESPIRATORY_TRACT
  Filled 2019-07-07 (×6): qty 2.5

## 2019-07-07 MED ORDER — UMECLIDINIUM-VILANTEROL 62.5-25 MCG/INH IN AEPB
1.0000 | INHALATION_SPRAY | Freq: Every day | RESPIRATORY_TRACT | Status: DC
  Administered 2019-07-08 – 2019-07-16 (×9): 1 via RESPIRATORY_TRACT
  Filled 2019-07-07: qty 14

## 2019-07-07 MED ORDER — VITAMIN D 25 MCG (1000 UNIT) PO TABS
1000.0000 [IU] | ORAL_TABLET | Freq: Every day | ORAL | Status: DC
  Administered 2019-07-08 – 2019-07-18 (×11): 1000 [IU] via ORAL
  Filled 2019-07-07 (×11): qty 1

## 2019-07-07 MED ORDER — ONDANSETRON HCL 4 MG/2ML IJ SOLN
4.0000 mg | Freq: Three times a day (TID) | INTRAMUSCULAR | Status: DC | PRN
  Administered 2019-07-07: 4 mg via INTRAVENOUS
  Filled 2019-07-07: qty 2

## 2019-07-07 MED ORDER — SODIUM CHLORIDE 0.9 % IV SOLN: INTRAVENOUS | Status: DC

## 2019-07-07 MED ORDER — SODIUM CHLORIDE 0.9% FLUSH
3.0000 mL | Freq: Once | INTRAVENOUS | Status: AC
  Administered 2019-07-07: 3 mL via INTRAVENOUS

## 2019-07-07 MED ORDER — DM-GUAIFENESIN ER 30-600 MG PO TB12
1.0000 | ORAL_TABLET | Freq: Two times a day (BID) | ORAL | Status: DC | PRN
  Administered 2019-07-08 – 2019-07-12 (×2): 1 via ORAL
  Filled 2019-07-07 (×2): qty 1

## 2019-07-07 NOTE — ED Provider Notes (Signed)
[facility] Emergency Department Provider Note  Time seen: 12:26 PM  I have reviewed the triage vital signs and the nursing notes.   HISTORY  Chief Complaint Shortness of Breath   HPI Kelly Hogan is a 79 y.o. female with a past medical history of asthma, COPD, diabetes, hypertension, obesity, presents to the emergency department for generalized weakness.  According to the patient for the past week or so she has been feeling extreme fatigue and weakness.  States she has been accumulating more fluid as well.  States her doctor recently took her off of her diuretic because it was affecting her kidneys.  Patient denies any fever cough.  Denies any vomiting or diarrhea but does state occasional nausea.   Patient wears 4 L of O2 chronically at home for COPD.  Denies any shortness of breath currently lying in bed.  Past Medical History:  Diagnosis Date  . Asthma   . COPD (chronic obstructive pulmonary disease) ([place])   . Diabetes mellitus without complication ([place])   . Hypertension   . Stomach ulcer     Patient Active Problem List   Diagnosis Date Noted  . Cellulitis and abscess of toe 03/17/2019  . Acute low back pain 05/22/2015  . Gastric ulcer 09/26/2014  . Osteopenia 09/07/2013  . H/O adenomatous polyp of colon 03/20/2013  . Chronic obstructive pulmonary disease ([place]) 07/05/2012  . Acid reflux 05/04/2012  . Chronic respiratory failure ([place]) 11/25/2010  . Chronic venous insufficiency 01/29/2010  . Type 2 diabetes mellitus ([place]) 11/07/2008  . Airway hyperreactivity 11/30/1996  . Essential (primary) hypertension 11/30/1996    History reviewed. No pertinent surgical history.  Prior to Admission medications   Medication Sig Start Date End Date Taking? Authorizing Provider  acetaminophen (TYLENOL) 500 MG tablet Take 500 mg by mouth. 04/22/11   [provider]  amLODipine (NORVASC) 5 MG tablet Take 5 mg by mouth daily. 04/28/17   [provider]  aspirin 81 MG EC tablet Take by mouth.    [provider]  azithromycin (ZITHROMAX) 250 MG tablet Take 250 mg by mouth daily. 03/19/18   [provider]  Blood Glucose Calibration (ACCU-CHEK SMARTVIEW CONTROL) LIQD  10/05/13   [provider]  Blood Glucose Monitoring Suppl (GLUCOCOM BLOOD GLUCOSE MONITOR) DEVI Use to check blood sugars. ICD 9: 250.00. 02/02/18   [provider]  calcium carbonate (OS-CAL - DOSED IN MG OF ELEMENTAL CALCIUM) 1250 (500 Ca) MG tablet Take by mouth. 01/13/11   [provider]  calcium carbonate (TUMS EX) 750 MG chewable tablet Chew by mouth. 01/25/15   [provider]  carvedilol (COREG) 12.5 MG tablet Take by mouth. 10/22/18   [provider]  cetirizine (ZYRTEC) 10 MG tablet Take 10 mg by mouth. 01/30/17 01/30/18  [provider]  Cholecalciferol (VITAMIN D-1000 MAX ST) 1000 units tablet Take by mouth. 04/22/11   [provider]  ciclopirox (LOPROX) 0.77 % cream Gently massage into affected areas and surrounding skin twice a day 04/05/19   [provider]  cloNIDine (CATAPRES) 0.1 MG tablet Take 0.1 mg by mouth 3 (three) times daily.    [provider]  diclofenac sodium (VOLTAREN) 1 % GEL Apply topically. 07/20/18   [provider]  docusate sodium (COLACE) 100 MG capsule Take 100 mg by mouth 2 (two) times daily. 04/06/16   [provider]  doxycycline (VIBRAMYCIN) 100 MG capsule Take by mouth. 04/05/19   [provider]  enalapril (  VASOTEC) 20 MG tablet Take 20 mg by mouth daily.    [provider]  fexofenadine (ALLEGRA) 180 MG tablet Take 180 mg by mouth daily. 11/24/17   [provider]  fluticasone (FLONASE) 50 MCG/ACT nasal spray SPRAY 1 SPRAY INTO EACH NOSTRIL EVERY DAY 04/01/18   [provider]  Fluticasone-Salmeterol (ADVAIR) 500-50 MCG/DOSE AEPB Inhale into the lungs. 03/16/18 03/16/19  [provider]  furosemide (LASIX) 40 MG tablet 1 TAB TWICE A DAY FOR 3 DAYS, THEN ONCE A DAY THEREAFTER. ADJUST ACCORDING TO FUTURE RECOMMENDATIONS 11/09/18   [provider]  glucose blood ([employer] TEST STRIPS) test strip Test blood sugars one time daily. Please dispense Accu Chek. ICD-10 E11.9 05/14/15   [provider]  Lancets Misc. (ACCU-CHEK SOFTCLIX LANCET DEV) KIT by Does not apply route. 05/31/13   [provider]  losartan (COZAAR) 100 MG tablet TAKE 1 TABLET (100 MG TOTAL) BY MOUTH DAILY. 02/12/18   [provider]  mupirocin ointment (BACTROBAN) 2 % APPLY TOPICALLY THREE (3) TIMES A DAY FOR 7 DAYS. 06/07/18   [provider]  omeprazole (PRILOSEC) 20 MG capsule Take 20 mg by mouth. 05/14/17 05/14/18  [provider]  [place] Calcium 500 MG TABS Take by mouth. 01/13/11   [provider]  polyethylene glycol powder (GLYCOLAX/MIRALAX) powder  04/06/13   [provider]  PROAIR HFA 108 [phone] Base) MCG/ACT inhaler  02/14/16   [provider]  Psyllium 400 MG CAPS Take by mouth. 10/27/17   [provider]  silver sulfADIAZINE (SILVADENE) 1 % cream APPLY TO AFFECTED AREA EVERY DAY 06/07/18   [provider]  simvastatin (ZOCOR) 10 MG tablet Take 10 mg by mouth daily.    [provider]  spironolactone (ALDACTONE) 25 MG tablet Take 25 mg by mouth daily. 10/17/17   [provider]  telmisartan (MICARDIS) 80 MG tablet Take 80 mg by mouth daily. 05/21/18   [provider]  tiotropium (SPIRIVA) 18 MCG inhalation capsule Place 18 mcg into inhaler and inhale daily.    [provider]  UNABLE TO FIND Take by mouth. 01/13/11   [provider]  UNABLE TO FIND Check Blood Sugar Daily E11.9 10/26/18   [provider]  UNABLE TO FIND Inhale 4 L into the lungs.    [provider]    Allergies  Allergen Reactions  . Enalapril Swelling    angioedema  .  Oxycodone-Acetaminophen Shortness Of Breath  . Oxycodone-Acetaminophen Shortness Of Breath  . Ranitidine Hcl Swelling  . Aspirin   . Hydrochlorothiazide Rash  . Penicillins Rash  . Ranitidine Hcl Rash    History reviewed. No pertinent family history.  Social History Social History   Tobacco Use  . Smoking status: Current Some Day Smoker  . Smokeless tobacco: Never Used  Substance Use Topics  . Alcohol use: No  . Drug use: No    Review of Systems Constitutional: Negative for fever. Cardiovascular: Negative for chest pain. Respiratory: Mild worsening shortness of breath especially with exertion over the past 1 week per patient Gastrointestinal: Negative for abdominal pain, vomiting and diarrhea.  Positive for nausea Genitourinary: Negative for urinary compaints Musculoskeletal: Increased leg swelling Neurological: Negative for headache All other ROS negative  ____________________________________________   PHYSICAL EXAM:  VITAL SIGNS: ED Triage Vitals  Enc Vitals Group     BP 07/07/19 0939 (!) 173/88     Pulse Rate 07/07/19 0939 73     Resp 07/07/19 0939  20     Temp 07/07/19 0939 97.7 F (36.5 C)     Temp Source 07/07/19 0939 Oral     SpO2 07/07/19 0939 96 %     Weight 07/07/19 0940 (!) 314 lb (142.4 kg)     Height 07/07/19 0940 _0  (1.626 m)     Head Circumference --      Peak Flow --      Pain Score 07/07/19 0939 0     Pain Loc --      Pain Edu? --      Excl. in [place]? --    Constitutional: Alert and oriented. Well appearing and in no distress. Eyes: Normal exam ENT      Head: Normocephalic and atraumatic.      Mouth/Throat: Mucous membranes are moist. Cardiovascular: Normal rate, regular rhythm. Respiratory: Normal respiratory effort without tachypnea nor retractions. Breath sounds are clear Gastrointestinal: Soft and nontender. No distention.  Obese. Musculoskeletal: Nontender with normal range of motion in all extremities.  Lower extreme edema, equal  bilaterally.  No weeping. Neurologic:  Normal speech and language. No gross focal neurologic deficits  Skin:  Skin is warm, dry and intact.  Psychiatric: Mood and affect are normal.   ____________________________________________    EKG  EKG viewed and interpreted by myself shows a normal sinus rhythm at 72 bpm with a narrow QRS, normal axis, normal intervals, no concerning ST changes.  ____________________________________________    RADIOLOGY  Chest x-ray negative  ____________________________________________   INITIAL IMPRESSION / ASSESSMENT AND PLAN / ED COURSE  Pertinent labs & imaging results that were available during my care of the patient were reviewed by me and considered in my medical decision making (see chart for details).   Patient presents to the emergency department for shortness of breath with exertion and generalized weakness over the past 1 week.  Patient's main concern is with somnolence and weakness.  Patient's initial work-up shows significant hyponatremia likely the cause of the patient's weakness.  We will start the patient on a normal saline infusion.  Reassuringly patient's EKG appears well, chest x-ray appears well.  I have added a troponin on as a precaution I have also added a corona swab as a precaution.  We will admit to the hospital service for further work-up and treatment.  Kelly Hogan was evaluated in Emergency Department on 07/07/2019 for the symptoms described in the history of present illness. She was evaluated in the context of the global COVID-19 pandemic, which necessitated consideration that the patient might be at risk for infection with the SARS-CoV-2 virus that causes COVID-19. Institutional protocols and algorithms that pertain to the evaluation of patients at risk for COVID-19 are in a state of rapid change based on information released by regulatory bodies including the CDC and federal and state organizations. These policies and  algorithms were followed during the patient's care in the ED.  ____________________________________________   FINAL CLINICAL IMPRESSION(S) / ED DIAGNOSES  Weakness Hyponatremia   Harvest Dark, MD 07/07/19 2037

## 2019-07-07 NOTE — ED Notes (Signed)
Sherie, RN handed off care and gave report to floor RN

## 2019-07-07 NOTE — ED Triage Notes (Signed)
Pt arrives EMS from home with c/o not feeling good for most of the week.  Pt reports increased SHOB and some minor increase in swelling of her legs.  Pt on chronic O2 at home at 4L [place], pt arrives on same.

## 2019-07-07 NOTE — ED Notes (Signed)
Pt is sitting in room and just finished eating. Pt has IVF infusing, please see MAR. Pt is alert and oriented. Room temperature adjusted for warmth. Pt is laying in bed with pure wick in place. Pt had oxygen off. Oxygen replaced at 4lpm and pt reminded to keep oxygen on.

## 2019-07-07 NOTE — Progress Notes (Signed)
O2 sat 86% on 4l/[place]. Increased O2 to 6l/[place] and sats up to 94%.

## 2019-07-07 NOTE — H&P (Signed)
History and Physical    RUMOR SUN MKL:[id number] DOB: Mar 17, 1941 DOA: 07/07/2019  Referring MD/NP/PA:   PCP: Lianne Moris, MD   Patient coming from:  The patient is coming from home.  At baseline, pt is independent for most of ADL.        Chief Complaint: Shortness of breath  HPI: Kelly Hogan is a 79 y.o. female with medical history significant of hypertension, hyperlipidemia, diet-controlled diabetes, COPD on 4 L oxygen, asthma, GERD, stomach ulcer, tobacco abuse, CKD stage III, who presents with shortness of breath.  Patient states that she has been having shortness of breath in the past several days, which has been progressively worsening.  She also has generalized weakness, bilateral lower leg edema.  Patient states that she had some chest discomfort yesterday, which has resolved.  She has mild dry cough, particularly in the morning.  She has nausea, no vomiting, diarrhea or abdominal pain.  No symptoms of UTI or unilateral weakness.   ED Course: pt was found to have WBC 4.4, BNP 129, negative COVID-19 PCR, stable renal function, potassium 5.2, sodium 12 3, chest x-ray with cardiomegaly, no infiltration.  Temperature normal, blood pressure 177/79, heart rate 66, RR 20, oxygen saturation 96% on 4 L nasal cannula oxygen which is at home level of oxygen.  Patient is admitted to progressive unit as inpatient.  Review of Systems:   General: no fevers, chills, has poor appetite, has fatigue HEENT: no blurry vision, hearing changes or sore throat Respiratory: has dyspnea, coughing, no wheezing CV: no chest pain, no palpitations GI: no nausea, vomiting, abdominal pain, diarrhea, constipation GU: no dysuria, burning on urination, increased urinary frequency, hematuria  Ext: has leg edema Neuro: no unilateral weakness, numbness, or tingling, no vision change or hearing loss Skin: no rash, no skin tear. MSK: No muscle spasm, no deformity, no limitation of range of movement in  spin Heme: No easy bruising.  Travel history: No recent long distant travel.  Allergy:  Allergies  Allergen Reactions  . Enalapril Swelling    angioedema  . Oxycodone-Acetaminophen Shortness Of Breath  . Ranitidine Hcl Swelling  . Aspirin   . Hydrochlorothiazide Rash  . Penicillins Rash    Past Medical History:  Diagnosis Date  . Asthma   . COPD (chronic obstructive pulmonary disease) ([place])   . Diabetes mellitus without complication ([place])   . Hypertension   . Stomach ulcer     History reviewed. No pertinent surgical history.  Social History:  reports that she has been smoking. She has never used smokeless tobacco. She reports that she does not drink alcohol or use drugs.  Family History:  Family History  Problem Relation Age of Onset  . Hypertension Sister   . Diabetes Mellitus II Brother      Prior to Admission medications   Medication Sig Start Date End Date Taking? Authorizing Provider  acetaminophen (TYLENOL) 500 MG tablet Take 500 mg by mouth. 04/22/11   [provider]  amLODipine (NORVASC) 5 MG tablet Take 5 mg by mouth daily. 04/28/17   [provider]  aspirin 81 MG EC tablet Take by mouth.    [provider]  azithromycin (ZITHROMAX) 250 MG tablet Take 250 mg by mouth daily. 03/19/18   [provider]  Blood Glucose Calibration (ACCU-CHEK SMARTVIEW CONTROL) LIQD  10/05/13   [provider]  Blood Glucose Monitoring Suppl (GLUCOCOM BLOOD GLUCOSE MONITOR) DEVI Use to check blood sugars. ICD 9: 250.00. 02/02/18  [provider]  calcium carbonate (OS-CAL - DOSED IN MG OF ELEMENTAL CALCIUM) 1250 (500 Ca) MG tablet Take by mouth. 01/13/11   [provider]  calcium carbonate (TUMS EX) 750 MG chewable tablet Chew by mouth. 01/25/15   [provider]  carvedilol (COREG) 12.5 MG tablet Take by mouth. 10/22/18   [provider]  cetirizine (ZYRTEC) 10 MG tablet Take 10 mg by mouth. 01/30/17  01/30/18  [provider]  Cholecalciferol (VITAMIN D-1000 MAX ST) 1000 units tablet Take by mouth. 04/22/11   [provider]  ciclopirox (LOPROX) 0.77 % cream Gently massage into affected areas and surrounding skin twice a day 04/05/19   [provider]  cloNIDine (CATAPRES) 0.1 MG tablet Take 0.1 mg by mouth 3 (three) times daily.    [provider]  diclofenac sodium (VOLTAREN) 1 % GEL Apply topically. 07/20/18   [provider]  docusate sodium (COLACE) 100 MG capsule Take 100 mg by mouth 2 (two) times daily. 04/06/16   [provider]  doxycycline (VIBRAMYCIN) 100 MG capsule Take by mouth. 04/05/19   [provider]  enalapril (VASOTEC) 20 MG tablet Take 20 mg by mouth daily.    [provider]  fexofenadine (ALLEGRA) 180 MG tablet Take 180 mg by mouth daily. 11/24/17   [provider]  fluticasone (FLONASE) 50 MCG/ACT nasal spray SPRAY 1 SPRAY INTO EACH NOSTRIL EVERY DAY 04/01/18   [provider]  Fluticasone-Salmeterol (ADVAIR) 500-50 MCG/DOSE AEPB Inhale into the lungs. 03/16/18 03/16/19  [provider]  furosemide (LASIX) 40 MG tablet 1 TAB TWICE A DAY FOR 3 DAYS, THEN ONCE A DAY THEREAFTER. ADJUST ACCORDING TO FUTURE RECOMMENDATIONS 11/09/18   [provider]  glucose blood ([employer] TEST STRIPS) test strip Test blood sugars one time daily. Please dispense Accu Chek. ICD-10 E11.9 05/14/15   [provider]  Lancets Misc. (ACCU-CHEK SOFTCLIX LANCET DEV) KIT by Does not apply route. 05/31/13   [provider]  losartan (COZAAR) 100 MG tablet TAKE 1 TABLET (100 MG TOTAL) BY MOUTH DAILY. 02/12/18   [provider]  mupirocin ointment (BACTROBAN) 2 % APPLY TOPICALLY THREE (3) TIMES A DAY FOR 7 DAYS. 06/07/18   [provider]  omeprazole (PRILOSEC) 20 MG capsule Take 20 mg by mouth. 05/14/17 05/14/18  [provider]  [place] Calcium 500 MG TABS Take by  mouth. 01/13/11   [provider]  polyethylene glycol powder (GLYCOLAX/MIRALAX) powder  04/06/13   [provider]  PROAIR HFA 108 [phone] Base) MCG/ACT inhaler  02/14/16   [provider]  Psyllium 400 MG CAPS Take by mouth. 10/27/17   [provider]  silver sulfADIAZINE (SILVADENE) 1 % cream APPLY TO AFFECTED AREA EVERY DAY 06/07/18   [provider]  simvastatin (ZOCOR) 10 MG tablet Take 10 mg by mouth daily.    [provider]  spironolactone (ALDACTONE) 25 MG tablet Take 25 mg by mouth daily. 10/17/17   [provider]  telmisartan (MICARDIS) 80 MG tablet Take 80 mg by mouth daily. 05/21/18   [provider]  tiotropium (SPIRIVA) 18 MCG inhalation capsule Place 18 mcg into inhaler and inhale daily.    [provider]  UNABLE TO FIND Take by mouth. 01/13/11   [provider]  UNABLE TO FIND Check Blood Sugar Daily E11.9 10/26/18   [provider]  UNABLE TO FIND Inhale 4 L into the lungs.    [provider]  Physical Exam: Vitals:   07/07/19 1430 07/07/19 1520 07/07/19 1530 07/07/19 1730  BP: (!) 176/81  (!) 171/65 (!) 154/74  Pulse: 72 80 76 72  Resp: 17 (!) '22 14 16  ' Temp:      TempSrc:      SpO2: 93% 97% 92% 100%  Weight:      Height:       General: Not in acute distress HEENT:       Eyes: PERRL, EOMI, no scleral icterus.       ENT: No discharge from the ears and nose, no pharynx injection, no tonsillar enlargement.        Neck: No JVD, no bruit, no mass felt. Heme: No neck lymph node enlargement. Cardiac: S1/S2, RRR, No murmurs, No gallops or rubs. Respiratory: Has diffused rhonchi bilaterally GI: Soft, nondistended, nontender, no rebound pain, no organomegaly, BS present. GU: No hematuria Ext: 1+ pitting leg edema bilaterally. 1+DP/PT pulse bilaterally. Musculoskeletal: No joint deformities, No joint redness or warmth, no limitation of ROM in spin. Skin: No rashes.   Neuro: Alert, oriented X3, cranial nerves II-XII grossly intact, moves all extremities normally. Psych: Patient is not psychotic, no suicidal or hemocidal ideation.  Labs on Admission: I have personally reviewed following labs and imaging studies  CBC: Recent Labs  Lab 07/07/19 0942  WBC 4.4  HGB 9.4*  HCT 30.7*  MCV 89.8  PLT 785   Basic Metabolic Panel: Recent Labs  Lab 07/07/19 0942 07/07/19 1530  NA 123* 125*  K 5.2* 5.0  CL 88* 91*  CO2 28 27  GLUCOSE 90 107*  BUN 27* 24*  CREATININE 1.24* 1.15*  CALCIUM 9.6 9.6   GFR: Estimated Creatinine Clearance: 58.1 mL/min (A) (by C-G formula based on SCr of 1.15 mg/dL (H)). Liver Function Tests: No results for input(s): AST, ALT, ALKPHOS, BILITOT, PROT, ALBUMIN in the last 168 hours. No results for input(s): LIPASE, AMYLASE in the last 168 hours. No results for input(s): AMMONIA in the last 168 hours. Coagulation Profile: No results for input(s): INR, PROTIME in the last 168 hours. Cardiac Enzymes: No results for input(s): CKTOTAL, CKMB, CKMBINDEX, TROPONINI in the last 168 hours. BNP (last 3 results) No results for input(s): PROBNP in the last 8760 hours. HbA1C: No results for input(s): HGBA1C in the last 72 hours. CBG: No results for input(s): GLUCAP in the last 168 hours. Lipid Profile: No results for input(s): CHOL, HDL, LDLCALC, TRIG, CHOLHDL, LDLDIRECT in the last 72 hours. Thyroid Function Tests: No results for input(s): TSH, T4TOTAL, FREET4, T3FREE, THYROIDAB in the last 72 hours. Anemia Panel: No results for input(s): VITAMINB12, FOLATE, FERRITIN, TIBC, IRON, RETICCTPCT in the last 72 hours. Urine analysis:    Component Value Date/Time   COLORURINE STRAW (A) 07/07/2019 1253   APPEARANCEUR CLEAR (A) 07/07/2019 1253   APPEARANCEUR Cloudy 10/14/2012 2100   LABSPEC 1.008 07/07/2019 1253   LABSPEC 1.016 10/14/2012 2100   PHURINE 6.0 07/07/2019 1253   GLUCOSEU NEGATIVE 07/07/2019 1253   GLUCOSEU Negative  10/14/2012 2100   HGBUR NEGATIVE 07/07/2019 [place] 07/07/2019 1253   BILIRUBINUR Negative 10/14/2012 2100   KETONESUR NEGATIVE 07/07/2019 1253   PROTEINUR NEGATIVE 07/07/2019 1253   NITRITE NEGATIVE 07/07/2019 1253   LEUKOCYTESUR NEGATIVE 07/07/2019 1253   LEUKOCYTESUR Negative 10/14/2012 2100   Sepsis Labs: '@LABRCNTIP' (procalcitonin:4,lacticidven:4) ) Recent Results (from the past 240 hour(s))  Respiratory Panel by RT PCR (Flu A&B, Covid) -     Status: None   Collection Time:  07/07/19 12:53 PM  Result Value Ref Range Status   SARS Coronavirus 2 by RT PCR NEGATIVE NEGATIVE Final    Comment: (NOTE) SARS-CoV-2 target nucleic acids are NOT DETECTED. The SARS-CoV-2 RNA is generally detectable in upper respiratoy specimens during the acute phase of infection. The lowest concentration of SARS-CoV-2 viral copies this assay can detect is 131 copies/mL. A negative result does not preclude SARS-Cov-2 infection and should not be used as the sole basis for treatment or other patient management decisions. A negative result may occur with  improper specimen collection/handling, submission of specimen other than nasopharyngeal swab, presence of viral mutation(s) within the areas targeted by this assay, and inadequate number of viral copies (<131 copies/mL). A negative result must be combined with clinical observations, patient history, and epidemiological information. The expected result is Negative. Fact Sheet for Patients:  PinkCheek.be Fact Sheet for Healthcare Providers:  GravelBags.it This test is not yet ap proved or cleared by the [place] FDA and  has been authorized for detection and/or diagnosis of SARS-CoV-2 by FDA under an Emergency Use Authorization (EUA). This EUA will remain  in effect (meaning this test can be used) for the duration of the COVID-19 declaration under Section 564(b)(1) of the Act,  21 U.S.C. section 360bbb-3(b)(1), unless the authorization is terminated or revoked sooner.    Influenza A by PCR NEGATIVE NEGATIVE Final   Influenza B by PCR NEGATIVE NEGATIVE Final    Comment: (NOTE) The Xpert Xpress SARS-CoV-2/FLU/RSV assay is intended as an aid in  the diagnosis of influenza from Nasopharyngeal swab specimens and  should not be used as a sole basis for treatment. Nasal washings and  aspirates are unacceptable for Xpert Xpress SARS-CoV-2/FLU/RSV  testing. Fact Sheet for Patients: PinkCheek.be Fact Sheet for Healthcare Providers: GravelBags.it This test is not yet approved or cleared by the [place] FDA and  has been authorized for detection and/or diagnosis of SARS-CoV-2 by  FDA under an Emergency Use Authorization (EUA). This EUA will remain  in effect (meaning this test can be used) for the duration of the  Covid-19 declaration under Section 564(b)(1) of the Act, 21  U.S.C. section 360bbb-3(b)(1), unless the authorization is  terminated or revoked. Performed at [facility], [address] [place], [place] [address]      Radiological Exams on Admission: DG Chest 2 View  Result Date: 07/07/2019 CLINICAL DATA:  Shortness of breath, not feeling well. Minor increase in swelling of the legs. History of asthma, COPD, diabetes, hypertension. EXAM: CHEST - 2 VIEW COMPARISON:  Chest x-ray dated 03/19/2019. FINDINGS: Stable mild cardiomegaly. Lungs are clear. No pleural effusion or pneumothorax is seen. No acute appearing osseous abnormality. Chronic mild compression deformity of a lower thoracic vertebral body. IMPRESSION: 1. No acute findings. No evidence of pneumonia or pulmonary edema. 2. Stable mild cardiomegaly. Electronically Signed   By: Franki Cabot M.D.   On: 07/07/2019 10:45     EKG: Independently reviewed.  Sinus rhythm, QTC 394, LAE, no ischemic change   Assessment/Plan Principal  Problem:   COPD exacerbation (HCC) Active Problems:   Acid reflux   Essential (primary) hypertension   HLD (hyperlipidemia)   CKD (chronic kidney disease), stage IIIa   Tobacco abuse   Generalized weakness   Hyponatremia   Hyperkalemia   Normocytic anemia   COPD exacerbation ([place]): Patient's short of breath is most likely due to COPD exacerbation given diffused rhonchi on auscultation.  Pt is taking Lasix and spironolactone at home, but not  carrying CHF diagnosis.  2D echo on 06/13/2017 showed EF >55.  No pulmonary edema on chest x-ray for BNP 129, does not seem to have CHF exacerbation.  -will admit to progressive unit as inpatient -Bronchodilators - Solu-Medrol 60 mg IV tid -Mucinex for cough  -Incentive spirometry -Follow up sputum culture -Nasal cannula oxygen as needed to maintain O2 saturation 93% or greater  Acid reflux: -Protonix  HTN:  -Continue home medications: Hydralazine, Coreg, irbesartan -hydralazine prn  HLD (hyperlipidemia) -zocor  CKD (chronic kidney disease), stage IIIa: stable -f/u renal Fx by BMP  Tobacco abuse: -nicotine patch  Generalized weakness: Likely multifactorial etiology -pt/OT  Hyperkalemia: mild. K 5.2 -on IVF -Hold off spironolactone -f/u by BMP   Hyponatremia: Sodium 123.  Likely due to poor oral intake and diuretics use - Will check urine sodium, urine osmolality, serum osmolality. - check TSH - IVF: 75 mL/h - f/u by BMP q8h - Hold Lasix and spironolactone  Normocytic anemia: hgb 10.3 on 03/19/19 -->9.4, no active bleeding -f/u by CBC   DVT ppx:  SQ Lovenox Code Status: Full code Family Communication: not done, no family member is at bed side.  I have tried to call his son twice without success Disposition Plan:  Anticipate discharge back to previous home environment Consults called:  none Admission status:  progressive unit  as inpt     Status is: Inpatient Remains inpatient appropriate because: see below Dispo: The  patient is from: Home              Anticipated d/c is to: Home              Anticipated d/c date is: 2 days              Patient currently is not medically stable to d/c.    Inpatient status:  # Patient requires inpatient status due to high intensity of service, high risk for further deterioration and high frequency of surveillance required.  I certify that at the point of admission it is my clinical judgment that the patient will require inpatient hospital care spanning beyond 2 midnights from the point of admission.  . This patient has multiple chronic comorbidities including hypertension, hyperlipidemia, diet-controlled diabetes, COPD on 4 L oxygen, asthma, GERD, stomach ulcer, tobacco abuse, CKD stage III .  Marland Kitchen Now patient has presenting with COPD exacerbation, hyponatremia, hyperkalemia  . the worrisome physical exam findings include diffuse rhonchi on auscultation, bilateral leg edema . The initial radiographic and laboratory data are worrisome because of hyperkalemia, hyponatremia, . Current medical needs: please see my assessment and plan . Predictability of an adverse outcome (risk): Patient has multiple comorbidities as listed above. Now presents with COPD exacerbation, hyponatremia, hyperkalemia. Patient's presentation is highly complicated.  Patient is at high risk of deteriorating.  Will need to be treated in hospital for at least 2 days.                     Date of Service 07/07/2019    [facility] [facility]   If 7PM-7AM, please contact night-coverage [email or web address] 07/07/2019, 6:19 PM

## 2019-07-07 NOTE — ED Notes (Signed)
Pt assisted to order lunch from dietary and tray given to pt from dietary. Pt disconnected from monitor to eat by this RN

## 2019-07-08 DIAGNOSIS — J9611 Chronic respiratory failure with hypoxia: Secondary | ICD-10-CM

## 2019-07-08 LAB — POTASSIUM: Potassium: 4.8 mmol/L (ref 3.5–5.1)

## 2019-07-08 LAB — BASIC METABOLIC PANEL
Anion gap: 7 (ref 5–15)
BUN: 21 mg/dL (ref 8–23)
CO2: 27 mmol/L (ref 22–32)
Calcium: 9.4 mg/dL (ref 8.9–10.3)
Chloride: 93 mmol/L — ABNORMAL LOW (ref 98–111)
Creatinine, Ser: 1 mg/dL (ref 0.44–1.00)
GFR calc Af Amer: >60 mL/min (ref >60)
GFR calc non Af Amer: 54 mL/min — ABNORMAL LOW (ref >60)
Glucose, Bld: 136 mg/dL — ABNORMAL HIGH (ref 70–99)
Potassium: 5.6 mmol/L — ABNORMAL HIGH (ref 3.5–5.1)
Sodium: 127 mmol/L — ABNORMAL LOW (ref 135–145)

## 2019-07-08 LAB — TSH: TSH: 1.705 u[IU]/mL (ref 0.350–4.500)

## 2019-07-08 MED ORDER — FUROSEMIDE 10 MG/ML IJ SOLN
40.0000 mg | Freq: Once | INTRAMUSCULAR | Status: AC
  Administered 2019-07-08: 40 mg via INTRAVENOUS
  Filled 2019-07-08: qty 4

## 2019-07-08 MED ORDER — IPRATROPIUM-ALBUTEROL 0.5-2.5 (3) MG/3ML IN SOLN
3.0000 mL | Freq: Four times a day (QID) | RESPIRATORY_TRACT | Status: DC
  Administered 2019-07-08 – 2019-07-13 (×16): 3 mL via RESPIRATORY_TRACT
  Filled 2019-07-08 (×16): qty 3

## 2019-07-08 MED ORDER — SODIUM POLYSTYRENE SULFONATE 15 GM/60ML PO SUSP
60.0000 g | Freq: Once | ORAL | Status: AC
  Administered 2019-07-08: 60 g via ORAL
  Filled 2019-07-08: qty 240

## 2019-07-08 NOTE — Progress Notes (Addendum)
PROGRESS NOTE    Kelly Hogan  RXV:[id number] DOB: 21-Nov-1940 DOA: 07/07/2019 PCP: Lianne Moris, MD (Confirm with patient/family/NH records and if not entered, this HAS to be entered at [facility] point of entry. "No PCP" if truly none.)   Brief Narrative: (Start on day 1 of progress note - keep it brief and live) Kelly Hogan is a 79 y.o. female with medical history significant of hypertension, hyperlipidemia, diet-controlled diabetes, COPD on 4 L oxygen, asthma, GERD, stomach ulcer, tobacco abuse, CKD stage III, who presents with shortness of breath.  Patient is diagnosed with COPD exacerbation, placed on IV steroids.  Also has hyperkalemia, given Kayexalate.   Assessment & Plan:   Principal Problem:   COPD exacerbation ([place]) Active Problems:   Acid reflux   Essential (primary) hypertension   HLD (hyperlipidemia)   CKD (chronic kidney disease), stage IIIa   Tobacco abuse   Generalized weakness   Hyponatremia   Hyperkalemia   Normocytic anemia  #1. COPD exacerbation. No evidence of congestive heart failure or volume overload.  BNP not significantly elevated, chest x-ray did not show any evidence of congestive heart failure.  Patient still has some wheezing.  We will continue IV steroids.  Obtain RT eval and treat.  Anticipate in the hospital for the next 2 days from this point.  #2.  Hyperkalemia. Patient not taking any potassium supplement, reviewed medication list, not on ACE inhibitor or potassium sparing diuretics.  Patient has been given 60 g of Kayexalate recheck potassium by 11 AM.  #3.  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  Will start fluid restriction.    #4.  Essential hypertension. .  Continue home medicines.  5.  Chronic kidney disease stage IIIa. Renal function stable.  #6.  Chronic hypoxemic restaurant failure. Continue oxygen treatment.      DVT prophylaxis: Lovenox Code Status: Full Family Communication: Plan discussed with the  patient. Disposition Plan:  . Patient came from:            . Anticipated d/c place: . Barriers to d/c OR conditions which need to be met to effect a safe d/c:   Consultants:   None  Procedures: None Antimicrobials: None  Subjective: Patient still complaining of significant short of breath with minimal exertion, wheezing.  She slept okay last night with elevated head.  She does not have any fever or chills.  She has no nausea vomiting or diarrhea. She is currently using 5 L oxygen.  Objective: Vitals:   07/08/19 0334 07/08/19 0419 07/08/19 0437 07/08/19 0738  BP:  (!) 159/75 (!) 162/75   Pulse:  82 81   Resp:  20    Temp:  97.6 F (36.4 C) 97.8 F (36.6 C)   TempSrc:  Oral Oral   SpO2: 95% 94% 94% 93%  Weight:      Height:        Intake/Output Summary (Last 24 hours) at 07/08/2019 0801 Last data filed at 07/08/2019 0300 Gross per 24 hour  Intake 796.79 ml  Output 1400 ml  Net -603.21 ml   Filed Weights   07/07/19 0940  Weight: (!) 142.4 kg    Examination:  General exam: Appears calm and comfortable  Respiratory system: Significant decreased breathing sounds. Respiratory effort normal. Cardiovascular system: S1 & S2 heard, RRR. No JVD, murmurs, rubs, gallops or clicks. Trace pedal edema. Gastrointestinal system: Abdomen is nondistended, soft and nontender. No organomegaly or masses felt. Normal bowel sounds heard. Central nervous system: Alert and oriented. No  focal neurological deficits. Extremities: Symmetric 5 x 5 power. Skin: No rashes, lesions or ulcers Psychiatry: Judgement and insight appear normal. Mood & affect appropriate.     Data Reviewed: I have personally reviewed following labs and imaging studies  CBC: Recent Labs  Lab 07/07/19 0942  WBC 4.4  HGB 9.4*  HCT 30.7*  MCV 89.8  PLT 111   Basic Metabolic Panel: Recent Labs  Lab 07/07/19 0942 07/07/19 1530 07/07/19 2139 07/08/19 0426  NA 123* 125* 126* 127*  K 5.2* 5.0 5.4* 5.6*  CL  88* 91* 91* 93*  CO2 '28 27 27 27  ' GLUCOSE 90 107* 121* 136*  BUN 27* 24* 23 21  CREATININE 1.24* 1.15* 1.00 1.00  CALCIUM 9.6 9.6 9.7 9.4   GFR: Estimated Creatinine Clearance: 66.8 mL/min (by C-G formula based on SCr of 1 mg/dL). Liver Function Tests: No results for input(s): AST, ALT, ALKPHOS, BILITOT, PROT, ALBUMIN in the last 168 hours. No results for input(s): LIPASE, AMYLASE in the last 168 hours. No results for input(s): AMMONIA in the last 168 hours. Coagulation Profile: No results for input(s): INR, PROTIME in the last 168 hours. Cardiac Enzymes: No results for input(s): CKTOTAL, CKMB, CKMBINDEX, TROPONINI in the last 168 hours. BNP (last 3 results) No results for input(s): PROBNP in the last 8760 hours. HbA1C: No results for input(s): HGBA1C in the last 72 hours. CBG: No results for input(s): GLUCAP in the last 168 hours. Lipid Profile: No results for input(s): CHOL, HDL, LDLCALC, TRIG, CHOLHDL, LDLDIRECT in the last 72 hours. Thyroid Function Tests: Recent Labs    07/08/19 0426  TSH 1.705   Anemia Panel: No results for input(s): VITAMINB12, FOLATE, FERRITIN, TIBC, IRON, RETICCTPCT in the last 72 hours. Sepsis Labs: No results for input(s): PROCALCITON, LATICACIDVEN in the last 168 hours.  Recent Results (from the past 240 hour(s))  Respiratory Panel by RT PCR (Flu A&B, Covid) -     Status: None   Collection Time: 07/07/19 12:53 PM  Result Value Ref Range Status   SARS Coronavirus 2 by RT PCR NEGATIVE NEGATIVE Final    Comment: (NOTE) SARS-CoV-2 target nucleic acids are NOT DETECTED. The SARS-CoV-2 RNA is generally detectable in upper respiratoy specimens during the acute phase of infection. The lowest concentration of SARS-CoV-2 viral copies this assay can detect is 131 copies/mL. A negative result does not preclude SARS-Cov-2 infection and should not be used as the sole basis for treatment or other patient management decisions. A negative result may occur  with  improper specimen collection/handling, submission of specimen other than nasopharyngeal swab, presence of viral mutation(s) within the areas targeted by this assay, and inadequate number of viral copies (<131 copies/mL). A negative result must be combined with clinical observations, patient history, and epidemiological information. The expected result is Negative. Fact Sheet for Patients:  PinkCheek.be Fact Sheet for Healthcare Providers:  GravelBags.it This test is not yet ap proved or cleared by the [place] FDA and  has been authorized for detection and/or diagnosis of SARS-CoV-2 by FDA under an Emergency Use Authorization (EUA). This EUA will remain  in effect (meaning this test can be used) for the duration of the COVID-19 declaration under Section 564(b)(1) of the Act, 21 U.S.C. section 360bbb-3(b)(1), unless the authorization is terminated or revoked sooner.    Influenza A by PCR NEGATIVE NEGATIVE Final   Influenza B by PCR NEGATIVE NEGATIVE Final    Comment: (NOTE) The Xpert Xpress SARS-CoV-2/FLU/RSV assay is intended as an aid in  the diagnosis of influenza from Nasopharyngeal swab specimens and  should not be used as a sole basis for treatment. Nasal washings and  aspirates are unacceptable for Xpert Xpress SARS-CoV-2/FLU/RSV  testing. Fact Sheet for Patients: PinkCheek.be Fact Sheet for Healthcare Providers: GravelBags.it This test is not yet approved or cleared by the [place] FDA and  has been authorized for detection and/or diagnosis of SARS-CoV-2 by  FDA under an Emergency Use Authorization (EUA). This EUA will remain  in effect (meaning this test can be used) for the duration of the  Covid-19 declaration under Section 564(b)(1) of the Act, 21  U.S.C. section 360bbb-3(b)(1), unless the authorization is  terminated or revoked. Performed  at [facility], [address], [place], [place] [address]          Radiology Studies: DG Chest 2 View  Result Date: 07/07/2019 CLINICAL DATA:  Shortness of breath, not feeling well. Minor increase in swelling of the legs. History of asthma, COPD, diabetes, hypertension. EXAM: CHEST - 2 VIEW COMPARISON:  Chest x-ray dated 03/19/2019. FINDINGS: Stable mild cardiomegaly. Lungs are clear. No pleural effusion or pneumothorax is seen. No acute appearing osseous abnormality. Chronic mild compression deformity of a lower thoracic vertebral body. IMPRESSION: 1. No acute findings. No evidence of pneumonia or pulmonary edema. 2. Stable mild cardiomegaly. Electronically Signed   By: Franki Cabot M.D.   On: 07/07/2019 10:45        Scheduled Meds: . aspirin EC  81 mg Oral Daily  . carvedilol  12.5 mg Oral BID WC  . cholecalciferol  1,000 Units Oral Daily  . docusate sodium  100 mg Oral BID  . enoxaparin (LOVENOX) injection  40 mg Subcutaneous Q12H  . fluticasone  1 spray Each Nare Daily  . hydrALAZINE  25 mg Oral BID  . ipratropium  0.5 mg Nebulization Q4H  . methylPREDNISolone (SOLU-MEDROL) injection  60 mg Intravenous Q12H  . nicotine  21 mg Transdermal Daily  . pantoprazole  40 mg Oral Daily  . simvastatin  10 mg Oral QHS  . umeclidinium-vilanterol  1 puff Inhalation Daily   Continuous Infusions:   LOS: 1 day    Time spent: 38 minutes    Sharen Hones, MD [facility]   To contact the attending provider between 7A-7P or the covering provider during after hours 7P-7A, please log into the web site [email or web address] and access using universal [place] password for that web site. If you do not have the password, please call the hospital operator.  07/08/2019, 8:01 AM

## 2019-07-08 NOTE — Progress Notes (Signed)
K 5.6. Called to on call NP, B. Jon Billings.

## 2019-07-08 NOTE — Progress Notes (Signed)
OT Cancellation Note  Patient Details Name: Kelly Hogan MRN: [id number] DOB: 09-Sep-1940   Cancelled Treatment:    Reason Eval/Treat Not Completed: Medical issues which prohibited therapy  OT consult received and chart reviewed. Upon chart review this AM, pt's K+ noted to be 5.6 (trending up with readings of 5.0 and then 5.54mmol/L yesterday 4/22). This falls outside recommended guidelines for activity. Will f/u for OT evaluation as pt becomes more appropriate. Thank you.   Rejeana Brock, MS, OTR/L ascom [phone] 07/08/19, 10:08 AM

## 2019-07-08 NOTE — Progress Notes (Signed)
CSW acknowledges consult to follow-up on discharge needs. TOC will follow-up based on PT and OT recommendations after PT and OT are able to assess patient.  Alfonso Ramus, [place] [phone]

## 2019-07-08 NOTE — Consult Note (Signed)
WOC Nurse Consult Note: Reason for Consult:Chronic bilateral lower leg edema.  Was recently being followed by [facility] and was noted to be discharged 05/08/19.  Family manages edema to lower legs with ace wraps. Legs with chronic skin changes but intact Wound type:inflmmatory Pressure Injury POA: NA Measurement: intact  Drainage (amount, consistency, odor) weeping due to excess fluid.  Being diuresed Periwound:chronic edema and skin changes Dressing procedure/placement/frequency: Cleanse legs with soap and water and pat dry. Wrap legs with kerlix from below toes to below knees.  Secure with ace wrap.  Change daily.  Will not follow at this time.  Please re-consult if needed.  Maple Hudson MSN, RN, FNP-BC CWON Wound, Ostomy, Continence Nurse Pager [phone]

## 2019-07-08 NOTE — Progress Notes (Signed)
Patient with asymptomatic type per kalemia.  ARB has already been discontinued.  EKG pending.  Secondary to concern for possible heart failure with chronic respiratory failure on home O2, hypertension, hyperlipidemia and chronic kidney disease stage III with a creatinine of 1 today.  Also noted she had generalized weakness and lower leg edema as initial complaints with presenting symptoms in addition to her mild dry cough.  Patient has been given 40 of Lasix, sodium polystyrene and EKG ordered.  Repeat potassium level in 4 hours

## 2019-07-09 ENCOUNTER — Inpatient Hospital Stay: Payer: Medicare Other

## 2019-07-09 DIAGNOSIS — E875 Hyperkalemia: Secondary | ICD-10-CM

## 2019-07-09 LAB — BASIC METABOLIC PANEL
Anion gap: 8 (ref 5–15)
BUN: 25 mg/dL — ABNORMAL HIGH (ref 8–23)
CO2: 30 mmol/L (ref 22–32)
Calcium: 9.3 mg/dL (ref 8.9–10.3)
Chloride: 91 mmol/L — ABNORMAL LOW (ref 98–111)
Creatinine, Ser: 1.11 mg/dL — ABNORMAL HIGH (ref 0.44–1.00)
GFR calc Af Amer: 55 mL/min — ABNORMAL LOW (ref >60)
GFR calc non Af Amer: 48 mL/min — ABNORMAL LOW (ref >60)
Glucose, Bld: 132 mg/dL — ABNORMAL HIGH (ref 70–99)
Potassium: 4.6 mmol/L (ref 3.5–5.1)
Sodium: 129 mmol/L — ABNORMAL LOW (ref 135–145)

## 2019-07-09 LAB — GLUCOSE, CAPILLARY: Glucose-Capillary: 127 mg/dL — ABNORMAL HIGH (ref 70–99)

## 2019-07-09 NOTE — Progress Notes (Signed)
OT Cancellation Note  Patient Details Name: Kelly Hogan MRN: [id number] DOB: 08/26/1940   Cancelled Treatment:    Reason Eval/Treat Not Completed: Patient at procedure or test/ unavailable  Pt off floor at ultrasound when occupational therapist presents for evaluation. Will f/u at later date/time as able/as pt becomes available. Thank you.  Rejeana Brock, MS, OTR/L ascom [phone] 07/09/19, 9:17 AM

## 2019-07-09 NOTE — Progress Notes (Signed)
PT Cancellation Note  Patient Details Name: Kelly Hogan MRN: [id number] DOB: 24-Mar-1940   Cancelled Treatment:    Reason Eval/Treat Not Completed: Patient at procedure or test/unavailable .   [address] [place], [place] DPT 07/09/2019, 10:30 AM

## 2019-07-09 NOTE — Plan of Care (Signed)
  Problem: Education: Goal: Knowledge of disease or condition will improve Outcome: Progressing Goal: Knowledge of the prescribed therapeutic regimen will improve Outcome: Progressing   Problem: Activity: Goal: Ability to tolerate increased activity will improve Outcome: Not Progressing Note: If patient completely off of supplemental oxygen, she desaturates to the high 60's. Informed the RT earlier. Will continue to monitor respiratory status for the remainder of the shift. Jari Favre [facility]

## 2019-07-09 NOTE — Progress Notes (Signed)
PROGRESS NOTE    Kelly Hogan  DZH:[id number] DOB: Aug 15, 1940 DOA: 07/07/2019 PCP: Lianne Moris, MD (Confirm with patient/family/NH records and if not entered, this HAS to be entered at [facility] point of entry. "No PCP" if truly none.)   Brief Narrative: (Start on day 1 of progress note - keep it brief and live) Kelly Cahue Burnettis a 79 y.o.femalewith medical history significant ofhypertension, hyperlipidemia, diet-controlled diabetes, COPD on 4 L oxygen, asthma, GERD, stomach ulcer, tobacco abuse, CKD stage III, who presents with shortness of breath.  Patient is diagnosed with COPD exacerbation, placed on IV steroids.  Also has hyperkalemia, given Kayexalate. 4/24.  Patient has significant desaturation while asleep, CPAP will be started tonight.   Assessment & Plan:   Principal Problem:   COPD exacerbation ([place]) Active Problems:   Acid reflux   Essential (primary) hypertension   HLD (hyperlipidemia)   CKD (chronic kidney disease), stage IIIa   Tobacco abuse   Generalized weakness   Hyponatremia   Hyperkalemia   Normocytic anemia  #1.  COP exacerbation. Patient still has significant bronchospasm.  We will continue steroids.  Discussed with respite therapist, continue aerosol treatment.  Add EZ PAP.  Patient worsening oxygenation at nighttime suggest significant OSA.   #2.  Likely OSA. Patient has significant desaturation while asleep.  Will start CPAP tonight.  Have spoken with charge nurse, will try to get records from [facility] to see the setting of CPAP if she already had sleep study.  If not, she will need to schedule outpatient sleep study in the near future.  In the meantime, she should to be on a higher oxygen at night while asleep.  #3.  Chronic kidney disease stage IIIa with hyperkalemia. Potassium is better after giving Kayexalate.  Recheck BMP tomorrow.  #4.  Loose stools. Check C. difficile toxin.  5.  Chronic hypoxemic respiratory failure. Patient  seemed to have a higher oxygen requirement probably due to obstructive apnea.  Will follow.  6.  Hyponatremia. Sodium level gradually improving.  Continue to follow.  7.  Left leg swelling with the pain. Probably from neuropathy.  Will obtain duplex ultrasound to rule out DVT.  8.  Anemia.   Check iron B12 level.    DVT prophylaxis: Lovenox Code Status: Full Family Communication: Discussed with the patient, all questions answered. Disposition Plan:  . Patient came from: Home            . Anticipated d/c place:Home . Barriers to d/c OR conditions which need to be met to effect a safe d/c:   Consultants:   None  Procedures: None Antimicrobials:None  Subjective: Patient still 5-1/2 L oxygen, this is a higher than her baseline.  Spoke with the nurse and the respiratory therapist, patient had a significant drop oxygen while napping or sleeps.  She was using 9 L oxygen last night.  Patient states that she had a sleep study in [place], I spoke with the charge nurse, will try to get her record.  She will be placed on CPAP machine tonight. She also had some loose stools today.  C. difficile toxin will be sent out.  He does not have nausea vomiting.  She does not have abdominal pain. She still has significant short of breath with exertion, she also has wheezing.  She has no cough.  Objective: Vitals:   07/08/19 1954 07/08/19 2222 07/09/19 0305 07/09/19 0735  BP: 133/62  (!) 143/64 (!) 158/76  Pulse: 73  84 79  Resp: '20  20 18  ' Temp: 98.2 F (36.8 C)  (!) 97.5 F (36.4 C) 98.2 F (36.8 C)  TempSrc: Oral  Oral Oral  SpO2: 100% 94% 98% 97%  Weight:   (!) 140.6 kg   Height:        Intake/Output Summary (Last 24 hours) at 07/09/2019 0804 Last data filed at 07/09/2019 0306 Gross per 24 hour  Intake 360 ml  Output 1400 ml  Net -1040 ml   Filed Weights   07/07/19 0940 07/09/19 0305  Weight: (!) 142.4 kg (!) 140.6 kg    Examination:  General exam: Appears calm and  comfortable  Respiratory system: Decreased breathing sounds with wheezes. Respiratory effort normal. Cardiovascular system: S1 & S2 heard, RRR. No JVD, murmurs, rubs, gallops or clicks. . Gastrointestinal system: Abdomen is nondistended, soft and nontender. No organomegaly or masses felt. Normal bowel sounds heard. Central nervous system: Alert and oriented. No focal neurological deficits. Extremities: Symmetric.  Chronic bilateral leg edema, worsening edema in left foot. Skin: No rashes, lesions or ulcers Psychiatry: Judgement and insight appear normal. Mood & affect appropriate.     Data Reviewed: I have personally reviewed following labs and imaging studies  CBC: Recent Labs  Lab 07/07/19 0942  WBC 4.4  HGB 9.4*  HCT 30.7*  MCV 89.8  PLT 010   Basic Metabolic Panel: Recent Labs  Lab 07/07/19 0942 07/07/19 0942 07/07/19 1530 07/07/19 2139 07/08/19 0426 07/08/19 1101 07/09/19 0629  NA 123*  --  125* 126* 127*  --  129*  K 5.2*   < > 5.0 5.4* 5.6* 4.8 4.6  CL 88*  --  91* 91* 93*  --  91*  CO2 28  --  '27 27 27  ' --  30  GLUCOSE 90  --  107* 121* 136*  --  132*  BUN 27*  --  24* 23 21  --  25*  CREATININE 1.24*  --  1.15* 1.00 1.00  --  1.11*  CALCIUM 9.6  --  9.6 9.7 9.4  --  9.3   < > = values in this interval not displayed.   GFR: Estimated Creatinine Clearance: 59.7 mL/min (A) (by C-G formula based on SCr of 1.11 mg/dL (H)). Liver Function Tests: No results for input(s): AST, ALT, ALKPHOS, BILITOT, PROT, ALBUMIN in the last 168 hours. No results for input(s): LIPASE, AMYLASE in the last 168 hours. No results for input(s): AMMONIA in the last 168 hours. Coagulation Profile: No results for input(s): INR, PROTIME in the last 168 hours. Cardiac Enzymes: No results for input(s): CKTOTAL, CKMB, CKMBINDEX, TROPONINI in the last 168 hours. BNP (last 3 results) No results for input(s): PROBNP in the last 8760 hours. HbA1C: No results for input(s): HGBA1C in the last  72 hours. CBG: No results for input(s): GLUCAP in the last 168 hours. Lipid Profile: No results for input(s): CHOL, HDL, LDLCALC, TRIG, CHOLHDL, LDLDIRECT in the last 72 hours. Thyroid Function Tests: Recent Labs    07/08/19 0426  TSH 1.705   Anemia Panel: No results for input(s): VITAMINB12, FOLATE, FERRITIN, TIBC, IRON, RETICCTPCT in the last 72 hours. Sepsis Labs: No results for input(s): PROCALCITON, LATICACIDVEN in the last 168 hours.  Recent Results (from the past 240 hour(s))  Respiratory Panel by RT PCR (Flu A&B, Covid) -     Status: None   Collection Time: 07/07/19 12:53 PM  Result Value Ref Range Status   SARS Coronavirus 2 by RT PCR NEGATIVE NEGATIVE Final  Comment: (NOTE) SARS-CoV-2 target nucleic acids are NOT DETECTED. The SARS-CoV-2 RNA is generally detectable in upper respiratoy specimens during the acute phase of infection. The lowest concentration of SARS-CoV-2 viral copies this assay can detect is 131 copies/mL. A negative result does not preclude SARS-Cov-2 infection and should not be used as the sole basis for treatment or other patient management decisions. A negative result may occur with  improper specimen collection/handling, submission of specimen other than nasopharyngeal swab, presence of viral mutation(s) within the areas targeted by this assay, and inadequate number of viral copies (<131 copies/mL). A negative result must be combined with clinical observations, patient history, and epidemiological information. The expected result is Negative. Fact Sheet for Patients:  PinkCheek.be Fact Sheet for Healthcare Providers:  GravelBags.it This test is not yet ap proved or cleared by the [place] FDA and  has been authorized for detection and/or diagnosis of SARS-CoV-2 by FDA under an Emergency Use Authorization (EUA). This EUA will remain  in effect (meaning this test can be used) for the  duration of the COVID-19 declaration under Section 564(b)(1) of the Act, 21 U.S.C. section 360bbb-3(b)(1), unless the authorization is terminated or revoked sooner.    Influenza A by PCR NEGATIVE NEGATIVE Final   Influenza B by PCR NEGATIVE NEGATIVE Final    Comment: (NOTE) The Xpert Xpress SARS-CoV-2/FLU/RSV assay is intended as an aid in  the diagnosis of influenza from Nasopharyngeal swab specimens and  should not be used as a sole basis for treatment. Nasal washings and  aspirates are unacceptable for Xpert Xpress SARS-CoV-2/FLU/RSV  testing. Fact Sheet for Patients: PinkCheek.be Fact Sheet for Healthcare Providers: GravelBags.it This test is not yet approved or cleared by the [place] FDA and  has been authorized for detection and/or diagnosis of SARS-CoV-2 by  FDA under an Emergency Use Authorization (EUA). This EUA will remain  in effect (meaning this test can be used) for the duration of the  Covid-19 declaration under Section 564(b)(1) of the Act, 21  U.S.C. section 360bbb-3(b)(1), unless the authorization is  terminated or revoked. Performed at [facility], [address] [place], [place] [address]          Radiology Studies: DG Chest 2 View  Result Date: 07/07/2019 CLINICAL DATA:  Shortness of breath, not feeling well. Minor increase in swelling of the legs. History of asthma, COPD, diabetes, hypertension. EXAM: CHEST - 2 VIEW COMPARISON:  Chest x-ray dated 03/19/2019. FINDINGS: Stable mild cardiomegaly. Lungs are clear. No pleural effusion or pneumothorax is seen. No acute appearing osseous abnormality. Chronic mild compression deformity of a lower thoracic vertebral body. IMPRESSION: 1. No acute findings. No evidence of pneumonia or pulmonary edema. 2. Stable mild cardiomegaly. Electronically Signed   By: Franki Cabot M.D.   On: 07/07/2019 10:45        Scheduled Meds: . aspirin EC  81  mg Oral Daily  . carvedilol  12.5 mg Oral BID WC  . cholecalciferol  1,000 Units Oral Daily  . docusate sodium  100 mg Oral BID  . enoxaparin (LOVENOX) injection  40 mg Subcutaneous Q12H  . fluticasone  1 spray Each Nare Daily  . hydrALAZINE  25 mg Oral BID  . ipratropium-albuterol  3 mL Nebulization Q6H  . methylPREDNISolone (SOLU-MEDROL) injection  60 mg Intravenous Q12H  . pantoprazole  40 mg Oral Daily  . simvastatin  10 mg Oral QHS  . umeclidinium-vilanterol  1 puff Inhalation Daily   Continuous Infusions:   LOS: 2  days    Time spent: 45 minutes    Sharen Hones, MD [facility]   To contact the attending provider between 7A-7P or the covering provider during after hours 7P-7A, please log into the web site [email or web address] and access using universal [place] password for that web site. If you do not have the password, please call the hospital operator.  07/09/2019, 8:04 AM

## 2019-07-09 NOTE — Progress Notes (Signed)
According to both patient and overnight RN wound dressing was changed this AM (ordered to be changed Q daily). Thus, will not need done again until night shift tonight. Will continue to monitor wound dressing status(es) for the remainder of the shift. Jari Favre [facility]

## 2019-07-09 NOTE — Progress Notes (Signed)
Asked by physician earlier today to obtain some CPAP setting info from [name]. Entered a TOC consult to assist with this request. See consult comments for additional information. Jari Favre [facility]

## 2019-07-10 LAB — CBC WITH DIFFERENTIAL/PLATELET
Abs Immature Granulocytes: 0.04 10*3/uL (ref 0.00–0.07)
Basophils Absolute: 0 10*3/uL (ref 0.0–0.1)
Basophils Relative: 0 %
Eosinophils Absolute: 0 10*3/uL (ref 0.0–0.5)
Eosinophils Relative: 0 %
HCT: 29.5 % — ABNORMAL LOW (ref 36.0–46.0)
Hemoglobin: 9.5 g/dL — ABNORMAL LOW (ref 12.0–15.0)
Immature Granulocytes: 1 %
Lymphocytes Relative: 3 %
Lymphs Abs: 0.3 10*3/uL — ABNORMAL LOW (ref 0.7–4.0)
MCH: 27.8 pg (ref 26.0–34.0)
MCHC: 32.2 g/dL (ref 30.0–36.0)
MCV: 86.3 fL (ref 80.0–100.0)
Monocytes Absolute: 0.6 10*3/uL (ref 0.1–1.0)
Monocytes Relative: 8 %
Neutro Abs: 7 10*3/uL (ref 1.7–7.7)
Neutrophils Relative %: 88 %
Platelets: 228 10*3/uL (ref 150–400)
RBC: 3.42 MIL/uL — ABNORMAL LOW (ref 3.87–5.11)
RDW: 12.7 % (ref 11.5–15.5)
WBC: 7.9 10*3/uL (ref 4.0–10.5)
nRBC: 0 % (ref 0.0–0.2)

## 2019-07-10 LAB — BASIC METABOLIC PANEL
Anion gap: 9 (ref 5–15)
BUN: 31 mg/dL — ABNORMAL HIGH (ref 8–23)
CO2: 31 mmol/L (ref 22–32)
Calcium: 9.3 mg/dL (ref 8.9–10.3)
Chloride: 91 mmol/L — ABNORMAL LOW (ref 98–111)
Creatinine, Ser: 1.1 mg/dL — ABNORMAL HIGH (ref 0.44–1.00)
GFR calc Af Amer: 56 mL/min — ABNORMAL LOW (ref >60)
GFR calc non Af Amer: 48 mL/min — ABNORMAL LOW (ref >60)
Glucose, Bld: 112 mg/dL — ABNORMAL HIGH (ref 70–99)
Potassium: 4.2 mmol/L (ref 3.5–5.1)
Sodium: 131 mmol/L — ABNORMAL LOW (ref 135–145)

## 2019-07-10 LAB — VITAMIN B12: Vitamin B-12: 1692 pg/mL — ABNORMAL HIGH (ref 180–914)

## 2019-07-10 LAB — IRON AND TIBC
Iron: 70 ug/dL (ref 28–170)
Saturation Ratios: 18 % (ref 10.4–31.8)
TIBC: 386 ug/dL (ref 250–450)
UIBC: 316 ug/dL

## 2019-07-10 NOTE — Progress Notes (Signed)
Return phone call from Dr. Lillia Mountain from [facility]. Per Dr. Rosita Fire, no CPAP settings available at this time. Patient had a sleep study in March, however has not had the titration yet. Titration scheduled for May.    Kelly Hogan, [place] Clinical Social Work [phone]

## 2019-07-10 NOTE — Progress Notes (Signed)
Physical Therapy Evaluation Patient Details Name: Kelly Hogan MRN: [id number] DOB: [birth year] Today's Date: 07/10/2019   History of Present Illness  Per MD note: Kelly Hogan is a 79 y.o. female with medical history significant of hypertension, hyperlipidemia, diet-controlled diabetes, COPD on 4 L oxygen, asthma, GERD, stomach ulcer, tobacco abuse, CKD stage III, who presents with shortness of breath.  Clinical Impression  Patient agrees to PT evaluation. She reports that she uses a cane and or RW at home. She has a ramp to get into her home. She reports that she was having difficulty moving in her bed and was sleeping in a recliner. She was taking a bird bath at home but said it was difficult.  She has poor strength 2/5 B hip flex and abd and knee extension is 2+/5 bilaterally. Patient has legs wrapped below knees. She performs rolling with max assist left and right and needs max assist for supine <> sit bed mobility. Patient is breathing labored and O2 saturation decreases while performing bed mobility and sitting. She is assisted with max assist back to supine and then repositioned in bed with max assist. Her O2 saturation returned to above 90%. She would benefit from skilled PT to improve mobility and strength.    Follow Up Recommendations      Equipment Recommendations       Recommendations for Other Services       Precautions / Restrictions Restrictions Weight Bearing Restrictions: No      Mobility  Bed Mobility Overal bed mobility: Needs Assistance Bed Mobility: Rolling;Supine to Sit;Sit to Supine Rolling: Max assist   Supine to sit: Max assist Sit to supine: Max assist   General bed mobility comments: breathing very hard/ SOB  Transfers Overall transfer level: (O2 sat decreases with sitting )                  Ambulation/Gait Ambulation/Gait assistance: (unable)              Stairs            Wheelchair Mobility    Modified Rankin  (Stroke Patients Only)       Balance Overall balance assessment: (O2 sat decreased with sitting)                                           Pertinent Vitals/Pain Pain Assessment: No/denies pain    Home Living Family/patient expects to be discharged to:: Skilled nursing facility Living Arrangements: Alone                    Prior Function Level of Independence: Independent with assistive device(s)         Comments: (Pt reports sleeping in a recliner due to difficulty moving)     Hand Dominance        Extremity/Trunk Assessment   Upper Extremity Assessment Upper Extremity Assessment: Overall WFL for tasks assessed    Lower Extremity Assessment Lower Extremity Assessment: RLE deficits/detail;LLE deficits/detail RLE: (right hip 2/5 flex/abd, knee ext 2/5) LLE Deficits / Details: (right hip 2/5 flex/abd, knee ext 2/5)       Communication   Communication: No difficulties  Cognition Arousal/Alertness: Awake/alert Behavior During Therapy: WFL for tasks assessed/performed Overall Cognitive Status: Within Functional Limits for tasks assessed  General Comments      Exercises     Assessment/Plan    PT Assessment Patient needs continued PT services  PT Problem List Decreased strength;Decreased activity tolerance;Decreased mobility;Cardiopulmonary status limiting activity       PT Treatment Interventions Gait training;Therapeutic activities;Therapeutic exercise;Balance training    PT Goals (Current goals can be found in the Care Plan section)  Acute Rehab PT Goals Patient Stated Goal: to sit up PT Goal Formulation: Patient unable to participate in goal setting Time For Goal Achievement: 07/24/19 Potential to Achieve Goals: Fair    Frequency Min 2X/week   Barriers to discharge        Co-evaluation               AM-PAC PT "6 Clicks" Mobility  Outcome Measure Help  needed turning from your back to your side while in a flat bed without using bedrails?: Total Help needed moving from lying on your back to sitting on the side of a flat bed without using bedrails?: Total Help needed moving to and from a bed to a chair (including a wheelchair)?: Total Help needed standing up from a chair using your arms (e.g., wheelchair or bedside chair)?: Total Help needed to walk in hospital room?: Total Help needed climbing 3-5 steps with a railing? : Total 6 Click Score: 6    End of Session Equipment Utilized During Treatment: Oxygen Activity Tolerance: Patient limited by fatigue;Other (comment)(labored breathing) Patient left: with nursing/sitter in room Nurse Communication: Mobility status PT Visit Diagnosis: Unsteadiness on feet (R26.81);Muscle weakness (generalized) (M62.81);Difficulty in walking, not elsewhere classified (R26.2)    Time: 0935-1000 PT Time Calculation (min) (ACUTE ONLY): 25 min   Charges:   PT Evaluation $PT Eval Low Complexity: 1 Low PT Treatments $Therapeutic Activity: 8-22 mins          Ezekiel Ina, PT DPT 07/10/2019, 10:24 AM

## 2019-07-10 NOTE — Progress Notes (Signed)
Consult received to obtain patient's CPAP settings from [facility]. [facility] contacted, [phone], left message for on call nurse to call this social worker back to obtain information requested from MD.   Verna Czech, LCSW Clinical Social Worker [phone]

## 2019-07-10 NOTE — Progress Notes (Signed)
PROGRESS NOTE    Kelly Hogan  WLN:[id number] DOB: 23-Jul-1940 DOA: 07/07/2019 PCP: Lianne Moris, MD (Confirm with patient/family/NH records and if not entered, this HAS to be entered at [facility] point of entry. "No PCP" if truly none.)   Brief Narrative: (Start on day 1 of progress note - keep it brief and live) Kelly Hogan a 79 y.o.femalewith medical history significant ofhypertension, hyperlipidemia, diet-controlled diabetes, COPD on 4 L oxygen, asthma, GERD, stomach ulcer, tobacco abuse, CKD stage III, who presents with shortness of breath.  Patient is diagnosed with COPD exacerbation, placed on IV steroids. Also has hyperkalemia, given Kayexalate. 4/24.  Patient has significant desaturation while asleep, CPAP will be started tonight.   Assessment & Plan:   Principal Problem:   COPD exacerbation ([place]) Active Problems:   Acid reflux   Essential (primary) hypertension   HLD (hyperlipidemia)   CKD (chronic kidney disease), stage IIIa   Tobacco abuse   Generalized weakness   Hyponatremia   Hyperkalemia   Normocytic anemia  #1.  COPD exacerbation. Patient still has bronchospasm, continue IV steroids.  Patient condition is complicated by obstructive sleep apnea.  She was placed on CPAP last night, she feels better today.  #2.  Obstructive sleep apnea. Patient had a significant desaturation while asleep or when she was taking a nap.  She was placed on CPAP last night, she was able to wear it for half the night.  She feels much better this morning.  Discussed with the patient and family, patient had a sleep study in [facility]  Apparently, she need to repeat the test which has been scheduled.  We will look into the possibility of getting her a temporary CPAP machine before the sleep study.  #3.  Chronic kidney disease stage IIIa with hyperkalemia. Potassium normalized after giving Kayexalate.  We will recheck BMP tomorrow.  4.  Chronic hypoxemic respiratory  failure secondary to COPD and obstructive sleep apnea. Continue oxygen treatment.  If it is not possible to obtain a CPAP machine temporarily before sleep study, we may have to increase oxygen at nighttime while asleep.  5.  Hyponatremia. Stable.  6.  Anemia. Patient does not have iron deficiency, B12 level is pending.  7.  Leg pain. No evidence of DVT on ultrasound.   DVT prophylaxis: Lovenox Code Status: Full Family Communication: Discussed with the patient, all questions answered. Disposition Plan:   Patient came from: Home                                                                                                                           Anticipated d/c place:Home  Barriers to d/c OR conditions which need to be met to effect a safe d/c:   Consultants:   None  Procedures: None Antimicrobials:None     Subjective: Patient was able to wear her CPAP partially last night.  She feels better this morning.  She still on 5 L oxygen, but short of  breath is better.  She still has some wheezing. She did not have any additional diarrhea.  Unable to send out for C. difficile toxin.  Objective: Vitals:   07/10/19 0314 07/10/19 0538 07/10/19 0826 07/10/19 1159  BP: (!) 172/90 (!) 147/69 (!) 168/72 (!) 153/73  Pulse: 77 81 81 87  Resp: '16  19 18  ' Temp: 98.3 F (36.8 C)  (!) 97.3 F (36.3 C) 98.1 F (36.7 C)  TempSrc:   Oral Oral  SpO2: 94%  97% 100%  Weight: (!) 141.1 kg     Height:        Intake/Output Summary (Last 24 hours) at 07/10/2019 1336 Last data filed at 07/10/2019 0900 Gross per 24 hour  Intake 590 ml  Output 700 ml  Net -110 ml   Filed Weights   07/07/19 0940 07/09/19 0305 07/10/19 0314  Weight: (!) 142.4 kg (!) 140.6 kg (!) 141.1 kg    Examination:  General exam: Appears calm and comfortable  Respiratory system: Significantly decreased breathing sounds.Marland Kitchen Respiratory effort normal. Cardiovascular system: S1 & S2 heard, RRR. No JVD, murmurs,  rubs, gallops or clicks. 2+ pedal edema. Gastrointestinal system: Abdomen is nondistended, soft and nontender. No organomegaly or masses felt. Normal bowel sounds heard. Central nervous system: Alert and oriented. No focal neurological deficits. Extremities: Symmetric  Skin: No rashes, lesions or ulcers Psychiatry: Judgement and insight appear normal. Mood & affect appropriate.     Data Reviewed: I have personally reviewed following labs and imaging studies  CBC: Recent Labs  Lab 07/07/19 0942 07/10/19 0721  WBC 4.4 7.9  NEUTROABS  --  7.0  HGB 9.4* 9.5*  HCT 30.7* 29.5*  MCV 89.8 86.3  PLT 205 010   Basic Metabolic Panel: Recent Labs  Lab 07/07/19 1530 07/07/19 1530 07/07/19 2139 07/08/19 0426 07/08/19 1101 07/09/19 0629 07/10/19 0721  NA 125*  --  126* 127*  --  129* 131*  K 5.0   < > 5.4* 5.6* 4.8 4.6 4.2  CL 91*  --  91* 93*  --  91* 91*  CO2 27  --  27 27  --  30 31  GLUCOSE 107*  --  121* 136*  --  132* 112*  BUN 24*  --  23 21  --  25* 31*  CREATININE 1.15*  --  1.00 1.00  --  1.11* 1.10*  CALCIUM 9.6  --  9.7 9.4  --  9.3 9.3   < > = values in this interval not displayed.   GFR: Estimated Creatinine Clearance: 60.4 mL/min (A) (by C-G formula based on SCr of 1.1 mg/dL (H)). Liver Function Tests: No results for input(s): AST, ALT, ALKPHOS, BILITOT, PROT, ALBUMIN in the last 168 hours. No results for input(s): LIPASE, AMYLASE in the last 168 hours. No results for input(s): AMMONIA in the last 168 hours. Coagulation Profile: No results for input(s): INR, PROTIME in the last 168 hours. Cardiac Enzymes: No results for input(s): CKTOTAL, CKMB, CKMBINDEX, TROPONINI in the last 168 hours. BNP (last 3 results) No results for input(s): PROBNP in the last 8760 hours. HbA1C: No results for input(s): HGBA1C in the last 72 hours. CBG: Recent Labs  Lab 07/09/19 1703  GLUCAP 127*   Lipid Profile: No results for input(s): CHOL, HDL, LDLCALC, TRIG, CHOLHDL,  LDLDIRECT in the last 72 hours. Thyroid Function Tests: Recent Labs    07/08/19 0426  TSH 1.705   Anemia Panel: Recent Labs    07/10/19 0721  TIBC 386  IRON 70   Sepsis Labs: No results for input(s): PROCALCITON, LATICACIDVEN in the last 168 hours.  Recent Results (from the past 240 hour(s))  Respiratory Panel by RT PCR (Flu A&B, Covid) -     Status: None   Collection Time: 07/07/19 12:53 PM  Result Value Ref Range Status   SARS Coronavirus 2 by RT PCR NEGATIVE NEGATIVE Final    Comment: (NOTE) SARS-CoV-2 target nucleic acids are NOT DETECTED. The SARS-CoV-2 RNA is generally detectable in upper respiratoy specimens during the acute phase of infection. The lowest concentration of SARS-CoV-2 viral copies this assay can detect is 131 copies/mL. A negative result does not preclude SARS-Cov-2 infection and should not be used as the sole basis for treatment or other patient management decisions. A negative result may occur with  improper specimen collection/handling, submission of specimen other than nasopharyngeal swab, presence of viral mutation(s) within the areas targeted by this assay, and inadequate number of viral copies (<131 copies/mL). A negative result must be combined with clinical observations, patient history, and epidemiological information. The expected result is Negative. Fact Sheet for Patients:  PinkCheek.be Fact Sheet for Healthcare Providers:  GravelBags.it This test is not yet ap proved or cleared by the [place] FDA and  has been authorized for detection and/or diagnosis of SARS-CoV-2 by FDA under an Emergency Use Authorization (EUA). This EUA will remain  in effect (meaning this test can be used) for the duration of the COVID-19 declaration under Section 564(b)(1) of the Act, 21 U.S.C. section 360bbb-3(b)(1), unless the authorization is terminated or revoked sooner.    Influenza A by PCR  NEGATIVE NEGATIVE Final   Influenza B by PCR NEGATIVE NEGATIVE Final    Comment: (NOTE) The Xpert Xpress SARS-CoV-2/FLU/RSV assay is intended as an aid in  the diagnosis of influenza from Nasopharyngeal swab specimens and  should not be used as a sole basis for treatment. Nasal washings and  aspirates are unacceptable for Xpert Xpress SARS-CoV-2/FLU/RSV  testing. Fact Sheet for Patients: PinkCheek.be Fact Sheet for Healthcare Providers: GravelBags.it This test is not yet approved or cleared by the [place] FDA and  has been authorized for detection and/or diagnosis of SARS-CoV-2 by  FDA under an Emergency Use Authorization (EUA). This EUA will remain  in effect (meaning this test can be used) for the duration of the  Covid-19 declaration under Section 564(b)(1) of the Act, 21  U.S.C. section 360bbb-3(b)(1), unless the authorization is  terminated or revoked. Performed at [facility], [address], [place], [place] [address]          Radiology Studies: US Venous Img Lower Unilateral Left (DVT)  Result Date: 07/09/2019 CLINICAL DATA:  Left lower extremity pain and edema. History of smoking. Evaluate for DVT. EXAM: LEFT LOWER EXTREMITY VENOUS DOPPLER ULTRASOUND TECHNIQUE: Gray-scale sonography with graded compression, as well as color Doppler and duplex ultrasound were performed to evaluate the lower extremity deep venous systems from the level of the common femoral vein and including the common femoral, femoral, profunda femoral, popliteal and calf veins including the posterior tibial, peroneal and gastrocnemius veins when visible. The superficial great saphenous vein was also interrogated. Spectral Doppler was utilized to evaluate flow at rest and with distal augmentation maneuvers in the common femoral, femoral and popliteal veins. COMPARISON:  None. FINDINGS: Examination is degraded due to patient body habitus  and poor sonographic window. Contralateral Common Femoral Vein: Respiratory phasicity is normal and symmetric with the symptomatic side. No evidence of thrombus. Normal  compressibility. Common Femoral Vein: No evidence of thrombus. Normal compressibility, respiratory phasicity and response to augmentation. Saphenofemoral Junction: No evidence of thrombus. Normal compressibility and flow on color Doppler imaging. Profunda Femoral Vein: No evidence of thrombus. Normal compressibility and flow on color Doppler imaging. Femoral Vein: No evidence of thrombus. Normal compressibility, respiratory phasicity and response to augmentation. Popliteal Vein: No evidence of thrombus. Normal compressibility, respiratory phasicity and response to augmentation. Calf Veins: No evidence of thrombus. Normal compressibility and flow on color Doppler imaging. Superficial Great Saphenous Vein: No evidence of thrombus. The vessel appears prominent proximally (representative images 14, 15, 17 and 18). Normal compressibility. Venous Reflux:  None. Other Findings:  None. IMPRESSION: No evidence of DVT within the left lower extremity. Electronically Signed   By: Sandi Mariscal M.D.   On: 07/09/2019 09:23        Scheduled Meds: . aspirin EC  81 mg Oral Daily  . carvedilol  12.5 mg Oral BID WC  . cholecalciferol  1,000 Units Oral Daily  . docusate sodium  100 mg Oral BID  . enoxaparin (LOVENOX) injection  40 mg Subcutaneous Q12H  . fluticasone  1 spray Each Nare Daily  . hydrALAZINE  25 mg Oral BID  . ipratropium-albuterol  3 mL Nebulization Q6H  . methylPREDNISolone (SOLU-MEDROL) injection  60 mg Intravenous Q12H  . pantoprazole  40 mg Oral Daily  . simvastatin  10 mg Oral QHS  . umeclidinium-vilanterol  1 puff Inhalation Daily   Continuous Infusions:   LOS: 3 days    Time spent: 25 minutes    Sharen Hones, MD [facility]   To contact the attending provider between 7A-7P or the covering provider during after  hours 7P-7A, please log into the web site [email or web address] and access using universal  password for that web site. If you do not have the password, please call the hospital operator.  07/10/2019, 1:36 PM

## 2019-07-10 NOTE — Clinical Social Work Note (Signed)
1.  Do you have a scale? No, one will be provided at bedside. 2.  Do you weigh yourself daily? Pt's son's visit daily and will weigh pt at this time 3.  Two pounds over night or 5 in one week call your doctor because it could be fluid overload? Aware 4.  Do you go to the Heart Failure Clinic? Yes 5. An appointment with heart failure clinic will be made before discharge if. 6. If patient is home bound, home health can be arranged with an agency that provides a heart failure protocol.   [place], [place] [phone]

## 2019-07-10 NOTE — Evaluation (Signed)
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 Pt presented SOB and was adm for COPD exacerbation.   Clinical Impression   Pt was seen for OT evaluation this date. Prior to hospital admission, pt was MOD I for ADL mobility with SPC or RW and MOD I for LB ADLs including using slip-on shoes only d/t limited ROM of LEs. Pt lives alone in [facility] with 3 STE at one entrance and ramped entrance at back. Currently pt demonstrates impairments as described below (See OT problem list) which functionally limit her ability to perform ADL/self-care tasks. Pt currently requires MIN A for ADL transfers/mobility with RW and MAX A with LB ADLs.  Pt would benefit from skilled OT to address noted impairments and functional limitations (see below for any additional details) in order to maximize safety and independence while minimizing falls risk and caregiver burden. Upon hospital discharge, recommend STR to maximize pt safety and return to PLOF. Will continue to assess for best d/c recommendation.     Follow Up Recommendations  SNF;Supervision - Intermittent    Equipment Recommendations  3 in 1 bedside commode;Tub/shower seat    Recommendations for Other Services       Precautions / Restrictions Precautions Precautions: Fall Restrictions Weight Bearing Restrictions: No Other Position/Activity Restrictions: monitor spO2, req'ed turned up to 5Lnc with activity on OT assessment this date.      Mobility Bed Mobility Overal bed mobility: Needs Assistance Bed Mobility: Supine to Sit     Supine to sit: Mod assist;HOB elevated        Transfers Overall transfer level: Needs assistance Equipment used: Rolling walker (2 wheeled) Transfers: Sit to/from [employer] Sit to Stand: Min assist Stand pivot transfers: Min assist            Balance Overall balance assessment: Needs assistance Sitting-balance support: Feet supported Sitting balance-Leahy Scale: Good     Standing balance support: Bilateral upper extremity supported Standing balance-Leahy Scale: Fair                             ADL either performed or assessed with clinical judgement   ADL                                         General ADL Comments: Pt able to perform seated UB ADLs with setup and requires MOD/MAX A with LB ADLs. Pt requires MIN A with RW for ADL trasnfers.     Vision Baseline Vision/History: Wears glasses Wears Glasses: At all times Patient Visual Report: No change from baseline       Perception     Praxis      Pertinent Vitals/Pain Pain Assessment: Faces Faces Pain Scale: Hurts little more Pain Location: blisters on backs of legs Pain Descriptors / Indicators: Discomfort;Guarding Pain Intervention(s): Monitored during session;Repositioned     Hand Dominance     Extremity/Trunk Assessment Upper Extremity Assessment Upper Extremity Assessment: Overall WFL for tasks assessed;Generalized weakness(shld ROM 3/4 arc of shld flexion, elbow and grip 4/5)   Lower Extremity Assessment Lower Extremity Assessment: Defer to PT evaluation       Communication  Communication Communication: No difficulties   Cognition Arousal/Alertness: Awake/alert Behavior During Therapy: WFL for tasks assessed/performed Overall Cognitive Status: Within Functional Limits for tasks assessed                                     General Comments       Exercises Other Exercises Other Exercises: OT facilitates education with pt and sons re: role of OT in acute setting. All parties with good understanding. Other Exercises: OT facilitates education with pt and sons re: potential need for rehab services following  hospitalization. Both parties with good understanding. Pt does express wanting to go home. Other Exercises: OT facilitates education with sons re: non-resisted exercises pt can perform on her own while sitting up in chair. Good understanding demo'ed.   Shoulder Instructions      Home Living Family/patient expects to be discharged to:: Private residence Living Arrangements: Alone Available Help at Discharge: Family;Friend(s) Type of Home: Other(Comment)("modular home") Home Access: Stairs to enter;Ramped entrance Entrance Stairs-Number of Steps: 3 at one entrance, ramped back entrance   Home Layout: One level               Home Equipment: Cane - single point;Walker - 2 wheels          Prior Functioning/Environment Level of Independence: Needs assistance  Gait / Transfers Assistance Needed: States she uses SPC primarily, but uses 2WW at times as well. ADL's / Homemaking Assistance Needed: Pt reports she is able to bathe and dress herself, endorses that LB ADLs are a struggle. States she dips her feet in a basin to bathe them and wears slip on shoes only because it is hard for her to put on shoes.   Comments: Sleeps in recliner. States her brother drives her to appointments. States her son gets her groceries.        OT Problem List: Decreased strength;Decreased activity tolerance;Impaired balance (sitting and/or standing);Cardiopulmonary status limiting activity;Obesity;Pain      OT Treatment/Interventions: Self-care/ADL training;Therapeutic exercise;Energy conservation;DME and/or AE instruction;Therapeutic activities;Patient/family education;Balance training    OT Goals(Current goals can be found in the care plan section) Acute Rehab OT Goals Patient Stated Goal: to get home safely OT Goal Formulation: With patient/family Time For Goal Achievement: 07/24/19 Potential to Achieve Goals: Good  OT Frequency: Min 2X/week   Barriers to D/C:            Co-evaluation               AM-PAC OT "6 Clicks" Daily Activity     Outcome Measure Help from another person eating meals?: None Help from another person taking care of personal grooming?: A Little Help from another person toileting, which includes using toliet, bedpan, or urinal?: A Little Help from another person bathing (including washing, rinsing, drying)?: A Little Help from another person to put on and taking off regular upper body clothing?: None Help from another person to put on and taking off regular lower body clothing?: A Little 6 Click Score: 20   End of Session Equipment Utilized During Treatment: Gait belt;Rolling walker Nurse Communication: Mobility status  Activity Tolerance: Patient tolerated treatment well Patient left: in chair;with call bell/phone within reach;with family/visitor present  OT Visit Diagnosis: Unsteadiness on feet (R26.81);Muscle weakness (generalized) (M62.81)                Time: 7371-0626 OT Time Calculation (min): 53 min Charges:  OT General Charges $OT Visit: 1 Visit OT Evaluation $OT Eval Moderate Complexity: 1 Mod OT Treatments $Self Care/Home Management : 23-37 mins $Therapeutic Activity: 8-22 mins  Gerrianne Scale, MS, OTR/L ascom [phone] 07/10/19, 2:07 PM

## 2019-07-11 DIAGNOSIS — I10 Essential (primary) hypertension: Secondary | ICD-10-CM

## 2019-07-11 DIAGNOSIS — R531 Weakness: Secondary | ICD-10-CM

## 2019-07-11 LAB — BASIC METABOLIC PANEL
Anion gap: 8 (ref 5–15)
BUN: 35 mg/dL — ABNORMAL HIGH (ref 8–23)
CO2: 31 mmol/L (ref 22–32)
Calcium: 9.3 mg/dL (ref 8.9–10.3)
Chloride: 91 mmol/L — ABNORMAL LOW (ref 98–111)
Creatinine, Ser: 1.1 mg/dL — ABNORMAL HIGH (ref 0.44–1.00)
GFR calc Af Amer: 56 mL/min — ABNORMAL LOW (ref >60)
GFR calc non Af Amer: 48 mL/min — ABNORMAL LOW (ref >60)
Glucose, Bld: 124 mg/dL — ABNORMAL HIGH (ref 70–99)
Potassium: 4.5 mmol/L (ref 3.5–5.1)
Sodium: 130 mmol/L — ABNORMAL LOW (ref 135–145)

## 2019-07-11 MED ORDER — ORAL CARE MOUTH RINSE
15.0000 mL | Freq: Two times a day (BID) | OROMUCOSAL | Status: DC
  Administered 2019-07-12 – 2019-07-17 (×12): 15 mL via OROMUCOSAL

## 2019-07-11 NOTE — TOC Initial Note (Signed)
Transition of Care [facility]) - Initial/Assessment Note    Patient Details  Name: Kelly Hogan MRN: [id number] Date of Birth: 1940/08/17  [facility] of [facility] CM/SW Contact:    Maree Krabbe, LCSW Phone Number: 07/11/2019, 10:59 AM  Clinical Narrative:                 CSW spoke with pt at bedside. OT is recommending SNF however, pt is refusing. Pt states she wants to go home. CSW will reach out to [facility] agencies to see if anyone will service her. Pt also needs a 3n1. Brad notified.  Expected Discharge Plan: Home w Home Health Services Barriers to Discharge: Continued Medical Work up   Patient Goals and CMS Choice Patient states their goals for this hospitalization and ongoing recovery are:: to go home   Choice offered to / list presented to : Patient  Expected Discharge Plan and Services Expected Discharge Plan: Home w Home Health Services In-house Referral: Clinical Social Work   Post Acute Care Choice: Home Health, Durable Medical Equipment Living arrangements for the past 2 months: Single Family Home                 DME Arranged: 3-N-1 DME Agency: [employer] Date DME Agency Contacted: 07/11/19 Time DME Agency Contacted: 1057 Representative spoke with at DME Agency: brad HH Arranged: RN, PT, OT, Nurse's Aide, Social Work          Prior Living Arrangements/Services Living arrangements for the past 2 months: Single Family Home Lives with:: Self Patient language and need for interpreter reviewed:: Yes Do you feel safe going back to the place where you live?: Yes      Need for Family Participation in Patient Care: Yes (Comment) Care giver support system in place?: Yes (comment)   Criminal Activity/Legal Involvement Pertinent to Current Situation/Hospitalization: No - Comment as needed  Activities of Daily Living Home Assistive Devices/Equipment: Eyeglasses, Environmental consultant (specify type) ADL Screening (condition at time of admission) Patient's cognitive ability adequate  to safely complete daily activities?: Yes Is the patient deaf or have difficulty hearing?: No Does the patient have difficulty seeing, even when wearing glasses/contacts?: No Does the patient have difficulty concentrating, remembering, or making decisions?: No Patient able to express need for assistance with ADLs?: Yes Does the patient have difficulty dressing or bathing?: Yes Independently performs ADLs?: Yes (appropriate for developmental age) Does the patient have difficulty walking or climbing stairs?: Yes Weakness of Legs: Both Weakness of Arms/Hands: Both  Permission Sought/Granted Permission sought to share information with : Family Supports    Share Information with NAME: tracy     Permission granted to share info w Relationship: son     Emotional Assessment Appearance:: Appears stated age Attitude/Demeanor/Rapport: Engaged Affect (typically observed): Accepting, Appropriate Orientation: : Oriented to Self, Oriented to Place, Oriented to  Time, Oriented to Situation Alcohol / Substance Use: Not Applicable Psych Involvement: No (comment)  Admission diagnosis:  COPD exacerbation (HCC) [J44.1] Patient Active Problem List   Diagnosis Date Noted  . 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/2019  . HLD (hyperlipidemia) 07/07/2019  . CKD (chronic kidney disease), stage IIIa 07/07/2019  . Tobacco abuse 07/07/2019  . Generalized weakness 07/07/2019  . Hyponatremia 07/07/2019  . Hyperkalemia 07/07/2019  . Normocytic anemia 07/07/2019  . Cellulitis and abscess of toe 03/17/2019  . Acute low back pain 05/22/2015  . Gastric ulcer 09/26/2014  . Osteopenia 09/07/2013  . H/O adenomatous polyp of colon 03/20/2013  .  COPD exacerbation ([place]) 07/05/2012  . Acid reflux 05/04/2012  . Chronic respiratory failure ([place]) 11/25/2010  . Chronic venous insufficiency 01/29/2010  . Type 2 diabetes mellitus ([place]) 11/07/2008  . Airway hyperreactivity 11/30/1996  .  Essential (primary) hypertension 11/30/1996   PCP:  Lianne Moris, MD Pharmacy:   [id number] - [place], [address] [place] [address] Phone: [phone] Fax: [phone]     Social Determinants of Health (SDOH) Interventions    Readmission Risk Interventions No flowsheet data found.

## 2019-07-11 NOTE — Hospital Course (Addendum)
Kelly Hogan is a 79 y.o. female with medical history significant of hypertension, hyperlipidemia, diet-controlled diabetes, COPD on 4 L oxygen, asthma, GERD, stomach ulcer, tobacco abuse, CKD stage III, who presents with shortness of breath.   Patient is diagnosed with COPD exacerbation, placed on IV steroids.  Also has hyperkalemia, given Kayexalate. 4/24.  Patient has significant desaturation while asleep, CPAP started and patient has tolerated.

## 2019-07-11 NOTE — Progress Notes (Signed)
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 Pt presented SOB and was adm for COPD exacerbation.   OT comments  Pt seen for OT tx this date to f/u re: safety with self care ADLs/ADL mobility. Pt able to perform sup to sit with MOD I with HOB elevated and use of bed rails. Seated up, she demos G static sitting balance. Pt participates in brushing hair with setup. Does de-sat slightly with this seated activity from 95% to 90% on 5Lnc. Takes seated RB prior to t/f to chair. With use of walker, pt takes 4-5 shuffling side steps from EOB to chair with Supv only (for verbal cues from OT for safe hand placement). Pt with F standing balance (G static, F with fxl mobility). Anticipate SNF is most prudent d/c recommendation for safety awareness.    Follow Up Recommendations  SNF;Supervision - Intermittent    Equipment Recommendations  3 in 1 bedside commode;Tub/shower seat    Recommendations for Other Services      Precautions / Restrictions Precautions Precautions: Fall Restrictions Weight Bearing Restrictions: No Other Position/Activity Restrictions: monitor spO2, req'ed turned up to 5Lnc with activity on OT assessment this date.       Mobility Bed Mobility Overal bed mobility: Modified Independent Bed Mobility: Supine to Sit     Supine to sit: Modified independent (Device/Increase time);HOB elevated     General bed mobility comments: heavy use of bed rails-indicates her bed at home is able to have the top portion elevate and she has railing as well.  Transfers Overall transfer level: Needs assistance Equipment used: Rolling walker (2 wheeled) Transfers: Sit to/from [facility] Sit to Stand: Supervision Stand pivot transfers:  Supervision       General transfer comment: OT provides supv purely for cues on safe hand placement with use of RW.    Balance Overall balance assessment: Needs assistance Sitting-balance support: Feet supported Sitting balance-Leahy Scale: Good     Standing balance support: Single extremity supported;Bilateral upper extremity supported;During functional activity Standing balance-Leahy Scale: Fair Standing balance comment: Pt requires support of at least one UE on RW for static standing balance, B UE for fxl mobility to take 3-4 shuffling steps from bed to chair.                           ADL either performed or assessed with clinical judgement   ADL                                         General ADL Comments: Pt has her personal reacher here with her and demos her process for donning underwear at home with SETUP and extended time. Brushes hair with setup.     Vision Baseline Vision/History: Wears glasses Wears Glasses: At all times Patient Visual Report: No change from baseline Additional Comments: uses magnifying glass to read her bible   Perception     Praxis      Cognition Arousal/Alertness: Awake/alert Behavior During Therapy: WFL for tasks assessed/performed Overall Cognitive Status: Within Functional Limits for tasks assessed  General Comments: some mild short-term memory deficits/forgetfullness-did not remember getting up with this therapist yesterday.        Exercises Other Exercises Other Exercises: OT facilitates education with pt re: potential d/c options. Pt refusing SNF. OT educates re different level of service offerings [facility] versus SNF. Pt with moderate understanding. Other Exercises: OT spends extensive time demonstrating and education re: safe hand placement with RW.   Shoulder Instructions       General Comments      Pertinent Vitals/ Pain       Pain Assessment:  Faces Faces Pain Scale: Hurts a little bit Pain Location: blisters on backs of legs Pain Descriptors / Indicators: Discomfort;Guarding Pain Intervention(s): Monitored during session;Repositioned  Home Living                                          Prior Functioning/Environment              Frequency  Min 2X/week        Progress Toward Goals  OT Goals(current goals can now be found in the care plan section)  Progress towards OT goals: Progressing toward goals  Acute Rehab OT Goals Patient Stated Goal: to get home safely OT Goal Formulation: With patient/family Time For Goal Achievement: 07/24/19 Potential to Achieve Goals: Good  Plan Discharge plan remains appropriate    Co-evaluation                 AM-PAC OT "6 Clicks" Daily Activity     Outcome Measure   Help from another person eating meals?: None Help from another person taking care of personal grooming?: A Little Help from another person toileting, which includes using toliet, bedpan, or urinal?: A Little Help from another person bathing (including washing, rinsing, drying)?: A Little Help from another person to put on and taking off regular upper body clothing?: None Help from another person to put on and taking off regular lower body clothing?: A Little 6 Click Score: 20    End of Session Equipment Utilized During Treatment: Gait belt;Rolling walker  OT Visit Diagnosis: Unsteadiness on feet (R26.81);Muscle weakness (generalized) (M62.81)   Activity Tolerance Patient tolerated treatment well   Patient Left in chair;with call bell/phone within reach;with family/visitor present;with chair alarm set   Nurse Communication Mobility status        Time: 3329-5188 OT Time Calculation (min): 23 min  Charges: OT General Charges $OT Visit: 1 Visit OT Treatments $Self Care/Home Management : 8-22 mins $Therapeutic Activity: 8-22 mins  Rejeana Brock, MS, OTR/L ascom  [phone] 07/11/19, 4:27 PM

## 2019-07-11 NOTE — Progress Notes (Signed)
Report called to [place], RN on 1C. Pt belongings gathered, and pt transported via bed to 1C at this time.

## 2019-07-11 NOTE — Care Management Important Message (Signed)
Important Message  Patient Details  Name: Kelly Hogan MRN: [id number] Date of Birth: 1940-11-24   Medicare Important Message Given:  Yes     Johnell Comings 07/11/2019, 12:16 PM

## 2019-07-11 NOTE — Progress Notes (Signed)
PROGRESS NOTE    Kelly Hogan   CNO:[id number]  DOB: 12-24-1940  PCP: Elvia Collum, MD    DOA: 07/07/2019 LOS: 4   Brief Narrative   Kelly Hogan is a 79 y.o. female with medical history significant of hypertension, hyperlipidemia, diet-controlled diabetes, COPD on 4 L oxygen, asthma, GERD, stomach ulcer, tobacco abuse, CKD stage III, who presents with shortness of breath.   Patient is diagnosed with COPD exacerbation, placed on IV steroids.  Also has hyperkalemia, given Kayexalate. 4/24.  Patient has significant desaturation while asleep, CPAP will be started tonight.     Assessment & Plan   Principal Problem:   COPD exacerbation (HCC) Active Problems:   Acid reflux   Essential (primary) hypertension   HLD (hyperlipidemia)   CKD (chronic kidney disease), stage IIIa   Tobacco abuse   Generalized weakness   Hyponatremia   Hyperkalemia   Normocytic anemia  COPD exacerbation -  Patient still has expiratory wheezes on exam, continue IV steroids.  Patient condition is complicated by obstructive sleep apnea.  She was placed on CPAP last night, she feels better today.  Obstructive sleep apnea -  Patient had a significant desaturation while asleep or when she was taking a nap.  She was placed on CPAP, she was able to wear it for half the first night and felt much better next morning.  Discussed with the patient and family, patient had a sleep study in [facility], but needs to repeat the test (titration portion) which has been scheduled.  We will look into the possibility of getting her a temporary CPAP machine before the sleep study.  Chronic kidney disease stage IIIa  -  Hyperkalemia - due to above, resolved Potassium normalized after giving Kayexalate.   --monitor BMP tomorrow --renally dose meds as indicated, avoid nephrotoxins and hypotension  Chronic hypoxemic respiratory failure secondary to COPD and obstructive sleep apnea -  Continue oxygen  treatment.  If it is not possible to obtain a CPAP machine temporarily before sleep study, we may have to increase oxygen at nighttime while asleep.  Hyponatremia - stable.  Monitor BMP.  Anemia of chronic disease - Normocytic.  No iron deficiency, B12 elevated. Monitor CBC.  Leg pain - No evidence of lower extremity DVT bilaterally on ultrasound.  Morbid Obesity Body mass index is 53.04 kg/m.  This complicates patient's overall prognosis and care in general.  DVT prophylaxis: Lovenox  Diet:  Diet Orders (From admission, onward)    Start     Ordered   07/08/19 0808  Diet renal/carb modified with fluid restriction Diet-HS Snack? Nothing; Fluid restriction: 1500 mL Fluid; Room service appropriate? Yes; Fluid consistency: Thin  Diet effective now    Question Answer Comment  Diet-HS Snack? Nothing   Fluid restriction: 1500 mL Fluid   Room service appropriate? Yes   Fluid consistency: Thin      07/08/19 0807            Code Status: Full Code    Subjective 07/11/19    Patient says feeling better.  Feels like some phlegm needs to come up, making her wheeze but otherwise says wheezing better.  No fever or chills.  No other acute complaints.    Disposition Plan & Communication   Dispo & Barriers: Expect d/c to prior home environment pending further improvement in patient's respiratory status. She currently continues to require IV steroids as above. Coming from: home Exp d/c date: 4/28 Medically stable for d/c? no  Family Communication: none at bedside, will attempt to call    Consults, Procedures, Significant Events   Consultants:   none  Procedures:   none   Objective   Vitals:   07/11/19 0619 07/11/19 0727 07/11/19 0732 07/11/19 1202  BP: (!) 159/79 (!) 170/85  (!) 178/86  Pulse: 78 77  87  Resp:  18  18  Temp:  97.9 F (36.6 C)  98.4 F (36.9 C)  TempSrc:  Oral  Oral  SpO2:  95% 97% 100%  Weight:      Height:        Intake/Output Summary (Last 24  hours) at 07/11/2019 1211 Last data filed at 07/11/2019 0946 Gross per 24 hour  Intake 840 ml  Output 1300 ml  Net -460 ml   Filed Weights   07/09/19 0305 07/10/19 0314 07/11/19 0413  Weight: (!) 140.6 kg (!) 141.1 kg (!) 140.2 kg    Physical Exam:  General exam: awake, alert, no acute distress, morbidly obese HEENT: moist mucus membranes, hearing grossly normal  Respiratory system: diffuse expiratory wheezes, fair aeration, decreased at bases, no rales or rhonchi apprecated, mildly increased respiratory effort, on 5 L/min nasal cannula oxygen. Cardiovascular system: normal S1/S2, RRR, BLE edema in wraps.   Central nervous system: A&O x3. no gross focal neurologic deficits, normal speech Extremities: moves all, wraps clean dry intact on bilateral lower extremities Skin: dry, intact, normal temperature, normal color Psychiatry: normal mood, congruent affect  Labs   Data Reviewed: I have personally reviewed following labs and imaging studies  CBC: Recent Labs  Lab 07/07/19 0942 07/10/19 0721  WBC 4.4 7.9  NEUTROABS  --  7.0  HGB 9.4* 9.5*  HCT 30.7* 29.5*  MCV 89.8 86.3  PLT 205 924   Basic Metabolic Panel: Recent Labs  Lab 07/07/19 2139 07/07/19 2139 07/08/19 0426 07/08/19 1101 07/09/19 0629 07/10/19 0721 07/11/19 0409  NA 126*  --  127*  --  129* 131* 130*  K 5.4*   < > 5.6* 4.8 4.6 4.2 4.5  CL 91*  --  93*  --  91* 91* 91*  CO2 27  --  27  --  30 31 31   GLUCOSE 121*  --  136*  --  132* 112* 124*  BUN 23  --  21  --  25* 31* 35*  CREATININE 1.00  --  1.00  --  1.11* 1.10* 1.10*  CALCIUM 9.7  --  9.4  --  9.3 9.3 9.3   < > = values in this interval not displayed.   GFR: Estimated Creatinine Clearance: 60.1 mL/min (A) (by C-G formula based on SCr of 1.1 mg/dL (H)). Liver Function Tests: No results for input(s): AST, ALT, ALKPHOS, BILITOT, PROT, ALBUMIN in the last 168 hours. No results for input(s): LIPASE, AMYLASE in the last 168 hours. No results for  input(s): AMMONIA in the last 168 hours. Coagulation Profile: No results for input(s): INR, PROTIME in the last 168 hours. Cardiac Enzymes: No results for input(s): CKTOTAL, CKMB, CKMBINDEX, TROPONINI in the last 168 hours. BNP (last 3 results) No results for input(s): PROBNP in the last 8760 hours. HbA1C: No results for input(s): HGBA1C in the last 72 hours. CBG: Recent Labs  Lab 07/09/19 1703  GLUCAP 127*   Lipid Profile: No results for input(s): CHOL, HDL, LDLCALC, TRIG, CHOLHDL, LDLDIRECT in the last 72 hours. Thyroid Function Tests: No results for input(s): TSH, T4TOTAL, FREET4, T3FREE, THYROIDAB in the last 72 hours. Anemia Panel: Recent  Labs    07/10/19 0721  VITAMINB12 1,692*  TIBC 386  IRON 70   Sepsis Labs: No results for input(s): PROCALCITON, LATICACIDVEN in the last 168 hours.  Recent Results (from the past 240 hour(s))  Respiratory Panel by RT PCR (Flu A&B, Covid) -     Status: None   Collection Time: 07/07/19 12:53 PM  Result Value Ref Range Status   SARS Coronavirus 2 by RT PCR NEGATIVE NEGATIVE Final    Comment: (NOTE) SARS-CoV-2 target nucleic acids are NOT DETECTED. The SARS-CoV-2 RNA is generally detectable in upper respiratoy specimens during the acute phase of infection. The lowest concentration of SARS-CoV-2 viral copies this assay can detect is 131 copies/mL. A negative result does not preclude SARS-Cov-2 infection and should not be used as the sole basis for treatment or other patient management decisions. A negative result may occur with  improper specimen collection/handling, submission of specimen other than nasopharyngeal swab, presence of viral mutation(s) within the areas targeted by this assay, and inadequate number of viral copies (<131 copies/mL). A negative result must be combined with clinical observations, patient history, and epidemiological information. The expected result is Negative. Fact Sheet for Patients:   https://www.moore.com/ Fact Sheet for Healthcare Providers:  https://www.young.biz/ This test is not yet ap proved or cleared by the [place] FDA and  has been authorized for detection and/or diagnosis of SARS-CoV-2 by FDA under an Emergency Use Authorization (EUA). This EUA will remain  in effect (meaning this test can be used) for the duration of the COVID-19 declaration under Section 564(b)(1) of the Act, 21 U.S.C. section 360bbb-3(b)(1), unless the authorization is terminated or revoked sooner.    Influenza A by PCR NEGATIVE NEGATIVE Final   Influenza B by PCR NEGATIVE NEGATIVE Final    Comment: (NOTE) The Xpert Xpress SARS-CoV-2/FLU/RSV assay is intended as an aid in  the diagnosis of influenza from Nasopharyngeal swab specimens and  should not be used as a sole basis for treatment. Nasal washings and  aspirates are unacceptable for Xpert Xpress SARS-CoV-2/FLU/RSV  testing. Fact Sheet for Patients: https://www.moore.com/ Fact Sheet for Healthcare Providers: https://www.young.biz/ This test is not yet approved or cleared by the [place] FDA and  has been authorized for detection and/or diagnosis of SARS-CoV-2 by  FDA under an Emergency Use Authorization (EUA). This EUA will remain  in effect (meaning this test can be used) for the duration of the  Covid-19 declaration under Section 564(b)(1) of the Act, 21  U.S.C. section 360bbb-3(b)(1), unless the authorization is  terminated or revoked. Performed at [facility], [address], [place], [place] [address]       Imaging Studies   No results found.   Medications   Scheduled Meds: . aspirin EC  81 mg Oral Daily  . carvedilol  12.5 mg Oral BID WC  . cholecalciferol  1,000 Units Oral Daily  . docusate sodium  100 mg Oral BID  . enoxaparin (LOVENOX) injection  40 mg Subcutaneous Q12H  . fluticasone  1 spray Each Nare  Daily  . hydrALAZINE  25 mg Oral BID  . ipratropium-albuterol  3 mL Nebulization Q6H  . methylPREDNISolone (SOLU-MEDROL) injection  60 mg Intravenous Q12H  . pantoprazole  40 mg Oral Daily  . simvastatin  10 mg Oral QHS  . umeclidinium-vilanterol  1 puff Inhalation Daily   Continuous Infusions:     LOS: 4 days    Time spent: 30 minutes    Pennie Banter, DO [facility]  If 7PM-7AM, please contact night-coverage [email or web address] 07/11/2019, 12:11 PM

## 2019-07-12 LAB — BASIC METABOLIC PANEL
Anion gap: 7 (ref 5–15)
BUN: 34 mg/dL — ABNORMAL HIGH (ref 8–23)
CO2: 34 mmol/L — ABNORMAL HIGH (ref 22–32)
Calcium: 9.6 mg/dL (ref 8.9–10.3)
Chloride: 91 mmol/L — ABNORMAL LOW (ref 98–111)
Creatinine, Ser: 0.92 mg/dL (ref 0.44–1.00)
GFR calc Af Amer: >60 mL/min (ref >60)
GFR calc non Af Amer: >60 mL/min (ref >60)
Glucose, Bld: 125 mg/dL — ABNORMAL HIGH (ref 70–99)
Potassium: 4.8 mmol/L (ref 3.5–5.1)
Sodium: 132 mmol/L — ABNORMAL LOW (ref 135–145)

## 2019-07-12 LAB — MAGNESIUM: Magnesium: 1.9 mg/dL (ref 1.7–2.4)

## 2019-07-12 MED ORDER — PREDNISONE 50 MG PO TABS
60.0000 mg | ORAL_TABLET | Freq: Every day | ORAL | Status: DC
  Administered 2019-07-12 – 2019-07-17 (×6): 60 mg via ORAL
  Filled 2019-07-12 (×6): qty 1

## 2019-07-12 MED ORDER — ALBUTEROL SULFATE (2.5 MG/3ML) 0.083% IN NEBU: 2.5000 mg | INHALATION_SOLUTION | RESPIRATORY_TRACT | Status: DC | PRN

## 2019-07-12 MED ORDER — FUROSEMIDE 40 MG PO TABS
40.0000 mg | ORAL_TABLET | Freq: Every day | ORAL | Status: DC
  Administered 2019-07-12 – 2019-07-18 (×7): 40 mg via ORAL
  Filled 2019-07-12 (×6): qty 1

## 2019-07-12 NOTE — Progress Notes (Signed)
Physical Therapy Treatment Patient Details Name: Kelly Hogan MRN: [id number] DOB: Aug 28, 1940 Today's Date: 07/12/2019    History of Present Illness Pt is 79 y/o F with PMH: HTN, HLD, diet-controlled DM, COPD on 4Lnc, asthma, GERD, stomach ulcer, tobacco abuse, and CKD3. Pt presented SOB and was adm for COPD exacerbation.    PT Comments    Pt was supine in bed with HOB elevated ~ 30 degrees upon arriving. She agrees to PT session and is very cooperative and pleasant throughout. Pt vitals stable throughout session on current O2. She was able to safely exit R side of bed using bed rails and bed controls without physical assistance. Sat EOB x 2 minutes prior to standing and ambulating to recliner using bariatric RW with CGA. NO LOB or unsteadiness. Fatigued noted but vitals stable. RN aware of pt's abilities. She continues to progress well with PT and therapist recommends DC to home with HHPT to address strength and endurance deficits.    Follow Up Recommendations  Home health PT     Equipment Recommendations  None recommended by PT    Recommendations for Other Services       Precautions / Restrictions Precautions Precautions: Fall Restrictions Weight Bearing Restrictions: No    Mobility  Bed Mobility Overal bed mobility: Modified Independent Bed Mobility: Supine to Sit     Supine to sit: Modified independent (Device/Increase time);HOB elevated     General bed mobility comments: Pt was able to exit R side of bed using bed rails and increased time. No physical assistance required. O2 remained stable throughout with HR elevated to 110 bpm only  Transfers Overall transfer level: Needs assistance Equipment used: Rolling walker (2 wheeled) Transfers: Sit to/from Stand Sit to Stand: Min guard         General transfer comment: CGA for safety to STS from elevated bed height. Vcs for improved technique and safety. she required rocking technique to use momentum to  stand  Ambulation/Gait Ambulation/Gait assistance: Min guard Gait Distance (Feet): 5 Feet Assistive device: Rolling walker (2 wheeled)(Bariatric) Gait Pattern/deviations: Step-to pattern     General Gait Details: Pt was able to safely demonstrate ability to stand and take ~ 5 steps to recliner with use of bariatric RW + vcs for posture correction. Overall pt tolerated gait well but fatigues quickly. Vitals stable throughout.   Stairs             Wheelchair Mobility    Modified Rankin (Stroke Patients Only)       Balance Overall balance assessment: Modified Independent Sitting-balance support: Feet supported Sitting balance-Leahy Scale: Good     Standing balance support: Bilateral upper extremity supported Standing balance-Leahy Scale: Good Standing balance comment: No LOB with BUE support on RW                            Cognition Arousal/Alertness: Awake/alert Behavior During Therapy: WFL for tasks assessed/performed Overall Cognitive Status: Within Functional Limits for tasks assessed                                 General Comments: Pt was A and O x 4 and able to follow commands consistently. She was very pleasant throughout.      Exercises      General Comments        Pertinent Vitals/Pain Pain Assessment: No/denies pain    Home Living  Prior Function            PT Goals (current goals can now be found in the care plan section) Acute Rehab PT Goals Patient Stated Goal: to get home safely Progress towards PT goals: Progressing toward goals    Frequency    Min 2X/week      PT Plan Discharge plan needs to be updated    Co-evaluation              AM-PAC PT "6 Clicks" Mobility   Outcome Measure  Help needed turning from your back to your side while in a flat bed without using bedrails?: A Little Help needed moving from lying on your back to sitting on the side of a flat bed  without using bedrails?: A Little Help needed moving to and from a bed to a chair (including a wheelchair)?: A Little Help needed standing up from a chair using your arms (e.g., wheelchair or bedside chair)?: A Little Help needed to walk in hospital room?: A Little Help needed climbing 3-5 steps with a railing? : A Little 6 Click Score: 18    End of Session Equipment Utilized During Treatment: Oxygen;Gait belt Activity Tolerance: Patient tolerated treatment well;Patient limited by fatigue Patient left: in chair;with chair alarm set;with call bell/phone within reach Nurse Communication: Mobility status PT Visit Diagnosis: Unsteadiness on feet (R26.81);Muscle weakness (generalized) (M62.81);Difficulty in walking, not elsewhere classified (R26.2)     Time: 0454-0981 PT Time Calculation (min) (ACUTE ONLY): 17 min  Julaine Fusi PTA 07/12/19, 5:08 PM

## 2019-07-12 NOTE — Progress Notes (Signed)
Bedside spirometry completed with patient at this time. Pt had the following results:  FEV1- Predicted: 1.65   Best: 0.43  Percent: 26.1 FVC-   Predicted: 2.13   Best: 1.02  Percent: 48.1 FEV1/FVC:  Predicted: 75.9  Best:41.9  Percent: 55.2  Please see data on chart.

## 2019-07-12 NOTE — Progress Notes (Signed)
PROGRESS NOTE    Kelly Hogan   YWV:[id number]  DOB: [birth year]  PCP: Elvia Collum, MD    DOA: 07/07/2019 LOS: 5   Brief Narrative   Kelly Hogan is a 79 y.o. female with medical history significant of hypertension, hyperlipidemia, diet-controlled diabetes, COPD on 4 L oxygen, asthma, GERD, stomach ulcer, tobacco abuse, CKD stage III, who presents with shortness of breath.   Patient is diagnosed with COPD exacerbation, placed on IV steroids.  Also has hyperkalemia, given Kayexalate. 4/24.  Patient has significant desaturation while asleep, CPAP started and patient has tolerated.       Assessment & Plan   Principal Problem:   COPD exacerbation (HCC) Active Problems:   Acid reflux   Essential (primary) hypertension   HLD (hyperlipidemia)   CKD (chronic kidney disease), stage IIIa   Tobacco abuse   Generalized weakness   Hyponatremia   Hyperkalemia   Normocytic anemia  COPD exacerbation -  Patient has extremely poor air movement and expiratory wheezes on exam.  Patient condition is complicated by obstructive sleep apnea.  She has been started on CPAP at night. Continue Prednisone 60 mg daily.  Would do longer taper and have very close pulmonary follow-up at [facility].  Obstructive sleep apnea -  Patient had a significant desaturation while asleep or when she was taking a nap.  She was placed on CPAP, she was able to wear it for half the first night and felt much better next morning.  Discussed with the patient and family, patient had a sleep study in [facility], but needs to repeat the test (titration portion) which has been scheduled.   --continue nightly CPAP during admission --bedside PFT's to see if Trilogy would be option on discharge  Chronic kidney disease stage IIIa  -  Hyperkalemia - due to above, resolved Potassium normalized after giving Kayexalate.   --monitor BMP tomorrow --renally dose meds as indicated, avoid nephrotoxins and hypotension   Chronic hypoxemic respiratory failure secondary to COPD and obstructive sleep apnea -  At baseline, uses 4 L/min.  Has been on 5 L/min past couple days. Supplement oxygen, keep O2 sat > 88%.  Evaluation for Trilogy underway, PFT's to see if she qualifies.  If not, possibly need to increase oxygen rate while asleep, until patient has her titration part of the sleep study.  Hyponatremia - stable.  Monitor BMP.  Anemia of chronic disease - Normocytic.  No iron deficiency, B12 elevated. Monitor CBC.  Leg pain - No evidence of lower extremity DVT bilaterally on ultrasound.  Morbid Obesity Body mass index is 53.04 kg/m.  This complicates patient's overall prognosis and care in general.  DVT prophylaxis: Lovenox  Diet:  Diet Orders (From admission, onward)    Start     Ordered   07/08/19 0808  Diet renal/carb modified with fluid restriction Diet-HS Snack? Nothing; Fluid restriction: 1500 mL Fluid; Room service appropriate? Yes; Fluid consistency: Thin  Diet effective now    Question Answer Comment  Diet-HS Snack? Nothing   Fluid restriction: 1500 mL Fluid   Room service appropriate? Yes   Fluid consistency: Thin      07/08/19 0807            Code Status: Full Code    Subjective 07/12/19    Patient says feeling better, "hanging in there".  She denies feeling short of breath at rest.  Denies fever/chills or other acute complaints.  Overall says feels a lot better and close to baseline.  Disposition Plan & Communication   Dispo & Barriers: Expect d/c to prior home environment tomorrow if stable or further improved in patient's respiratory status. Evaluation for Trilogy underway.  She is on 5 L/min, baseline uses 4 L/min.  Coming from: home Exp d/c date: 4/28 Medically stable for d/c? no  Family Communication: none at bedside, will attempt to call    Consults, Procedures, Significant Events   Consultants:   none  Procedures:   none   Objective   Vitals:    07/12/19 0339 07/12/19 0742 07/12/19 0746 07/12/19 1630  BP: (!) 159/75 (!) 165/79  (!) 176/71  Pulse: 83 84  83  Resp: 18 19  20   Temp: 97.8 F (36.6 C) 98.1 F (36.7 C)  98 F (36.7 C)  TempSrc: Axillary Oral  Oral  SpO2: 93% 94% 94% 93%  Weight:      Height:        Intake/Output Summary (Last 24 hours) at 07/12/2019 1637 Last data filed at 07/12/2019 1630 Gross per 24 hour  Intake 600 ml  Output 2300 ml  Net -1700 ml   Filed Weights   07/09/19 0305 07/10/19 0314 07/11/19 0413  Weight: (!) 140.6 kg (!) 141.1 kg (!) 140.2 kg    Physical Exam:  General exam: awake, alert, no acute distress, morbidly obese Respiratory system: very poor aeration posteriorly, anteriorly expiratory wheezes improved today, on 5 L/min nasal cannula oxygen. Cardiovascular system: normal S1/S2, RRR, BLE edema in wraps.   Central nervous system: A&O x3. no gross focal neurologic deficits, normal speech Extremities: moves all, wraps clean dry intact on bilateral lower extremities   Labs   Data Reviewed: I have personally reviewed following labs and imaging studies  CBC: Recent Labs  Lab 07/07/19 0942 07/10/19 0721  WBC 4.4 7.9  NEUTROABS  --  7.0  HGB 9.4* 9.5*  HCT 30.7* 29.5*  MCV 89.8 86.3  PLT 205 228   Basic Metabolic Panel: Recent Labs  Lab 07/08/19 0426 07/08/19 0426 07/08/19 1101 07/09/19 0629 07/10/19 0721 07/11/19 0409 07/12/19 0549  NA 127*  --   --  129* 131* 130* 132*  K 5.6*   < > 4.8 4.6 4.2 4.5 4.8  CL 93*  --   --  91* 91* 91* 91*  CO2 27  --   --  30 31 31  34*  GLUCOSE 136*  --   --  132* 112* 124* 125*  BUN 21  --   --  25* 31* 35* 34*  CREATININE 1.00  --   --  1.11* 1.10* 1.10* 0.92  CALCIUM 9.4  --   --  9.3 9.3 9.3 9.6  MG  --   --   --   --   --   --  1.9   < > = values in this interval not displayed.   GFR: Estimated Creatinine Clearance: 71.9 mL/min (by C-G formula based on SCr of 0.92 mg/dL). Liver Function Tests: No results for input(s): AST,  ALT, ALKPHOS, BILITOT, PROT, ALBUMIN in the last 168 hours. No results for input(s): LIPASE, AMYLASE in the last 168 hours. No results for input(s): AMMONIA in the last 168 hours. Coagulation Profile: No results for input(s): INR, PROTIME in the last 168 hours. Cardiac Enzymes: No results for input(s): CKTOTAL, CKMB, CKMBINDEX, TROPONINI in the last 168 hours. BNP (last 3 results) No results for input(s): PROBNP in the last 8760 hours. HbA1C: No results for input(s): HGBA1C in the last 72 hours. CBG:  Recent Labs  Lab 07/09/19 1703  GLUCAP 127*   Lipid Profile: No results for input(s): CHOL, HDL, LDLCALC, TRIG, CHOLHDL, LDLDIRECT in the last 72 hours. Thyroid Function Tests: No results for input(s): TSH, T4TOTAL, FREET4, T3FREE, THYROIDAB in the last 72 hours. Anemia Panel: Recent Labs    07/10/19 0721  VITAMINB12 1,692*  TIBC 386  IRON 70   Sepsis Labs: No results for input(s): PROCALCITON, LATICACIDVEN in the last 168 hours.  Recent Results (from the past 240 hour(s))  Respiratory Panel by RT PCR (Flu A&B, Covid) -     Status: None   Collection Time: 07/07/19 12:53 PM  Result Value Ref Range Status   SARS Coronavirus 2 by RT PCR NEGATIVE NEGATIVE Final    Comment: (NOTE) SARS-CoV-2 target nucleic acids are NOT DETECTED. The SARS-CoV-2 RNA is generally detectable in upper respiratoy specimens during the acute phase of infection. The lowest concentration of SARS-CoV-2 viral copies this assay can detect is 131 copies/mL. A negative result does not preclude SARS-Cov-2 infection and should not be used as the sole basis for treatment or other patient management decisions. A negative result may occur with  improper specimen collection/handling, submission of specimen other than nasopharyngeal swab, presence of viral mutation(s) within the areas targeted by this assay, and inadequate number of viral copies (<131 copies/mL). A negative result must be combined with clinical  observations, patient history, and epidemiological information. The expected result is Negative. Fact Sheet for Patients:  PinkCheek.be Fact Sheet for Healthcare Providers:  GravelBags.it This test is not yet ap proved or cleared by the [place] FDA and  has been authorized for detection and/or diagnosis of SARS-CoV-2 by FDA under an Emergency Use Authorization (EUA). This EUA will remain  in effect (meaning this test can be used) for the duration of the COVID-19 declaration under Section 564(b)(1) of the Act, 21 U.S.C. section 360bbb-3(b)(1), unless the authorization is terminated or revoked sooner.    Influenza A by PCR NEGATIVE NEGATIVE Final   Influenza B by PCR NEGATIVE NEGATIVE Final    Comment: (NOTE) The Xpert Xpress SARS-CoV-2/FLU/RSV assay is intended as an aid in  the diagnosis of influenza from Nasopharyngeal swab specimens and  should not be used as a sole basis for treatment. Nasal washings and  aspirates are unacceptable for Xpert Xpress SARS-CoV-2/FLU/RSV  testing. Fact Sheet for Patients: PinkCheek.be Fact Sheet for Healthcare Providers: GravelBags.it This test is not yet approved or cleared by the [place] FDA and  has been authorized for detection and/or diagnosis of SARS-CoV-2 by  FDA under an Emergency Use Authorization (EUA). This EUA will remain  in effect (meaning this test can be used) for the duration of the  Covid-19 declaration under Section 564(b)(1) of the Act, 21  U.S.C. section 360bbb-3(b)(1), unless the authorization is  terminated or revoked. Performed at [facility], [address] [place], [place] [address]       Imaging Studies   No results found.   Medications   Scheduled Meds: . aspirin EC  81 mg Oral Daily  . carvedilol  12.5 mg Oral BID WC  . cholecalciferol  1,000 Units Oral Daily  .  docusate sodium  100 mg Oral BID  . enoxaparin (LOVENOX) injection  40 mg Subcutaneous Q12H  . fluticasone  1 spray Each Nare Daily  . hydrALAZINE  25 mg Oral BID  . ipratropium-albuterol  3 mL Nebulization Q6H  . mouth rinse  15 mL Mouth Rinse BID  . pantoprazole  40 mg Oral Daily  . predniSONE  60 mg Oral Q breakfast  . simvastatin  10 mg Oral QHS  . umeclidinium-vilanterol  1 puff Inhalation Daily   Continuous Infusions:     LOS: 5 days    Time spent: 45 minutes total, with > 50% in direct patient care and coordinating care.    Pennie Banter, DO [facility]   If 7PM-7AM, please contact night-coverage [email or web address] 07/12/2019, 4:37 PM

## 2019-07-12 NOTE — TOC Progression Note (Signed)
Transition of Care [facility]) - Progression Note    Patient Details  Name: Kelly Hogan MRN: [id number] Date of Birth: 06/14/1940  Transition [facility] Care [facility]) CM/SW Contact  Allayne Butcher, RN Phone Number: 07/12/2019, 2:02 PM  Clinical Narrative:    Patient is on chronic O2 at home 4L and she is followed by [facility].  Patient had a sleep study in March and is scheduled for a titration study in May.  Patient has been using a CPAP while here in the hospital.  MD agrees with working patient up here for home non invasive ventilator.  Brad with [employer] given referral for NIV and bedside pulmonary function test has been ordered.   Patient will need home health services at discharge and [facility] has accepted the referral for RN, PT, OT, aide and SW.   RNCM will cont to follow and coordinate discharge services.    Expected Discharge Plan: Home w Home Health Services Barriers to Discharge: Continued Medical Work up  Expected Discharge Plan and Services Expected Discharge Plan: Home w Home Health Services In-house Referral: Clinical Social Work Discharge Planning Services: CM Consult Post Acute Care Choice: Durable Medical Equipment, Home Health Living arrangements for the past 2 months: Single Family Home                 DME Arranged: NIV DME Agency: [facility] Date DME Agency Contacted: 07/12/19 Time DME Agency Contacted: 1401 Representative spoke with at DME Agency: Mitchell Heir HH Arranged: RN, PT, OT, Nurse's Aide, Social Work [employer] Agency: [facility] Date [facility] Agency Contacted: 07/12/19 Time HH Agency Contacted: 1402 Representative spoke with at [facility] Agency: Kandee Keen   Social Determinants of Health (SDOH) Interventions    Readmission Risk Interventions No flowsheet data found.

## 2019-07-13 ENCOUNTER — Encounter: Payer: Self-pay | Admitting: Internal Medicine

## 2019-07-13 DIAGNOSIS — Z6841 Body Mass Index (BMI) 40.0 and over, adult: Secondary | ICD-10-CM

## 2019-07-13 DIAGNOSIS — N1831 Chronic kidney disease, stage 3a: Secondary | ICD-10-CM

## 2019-07-13 DIAGNOSIS — J9621 Acute and chronic respiratory failure with hypoxia: Secondary | ICD-10-CM

## 2019-07-13 DIAGNOSIS — J441 Chronic obstructive pulmonary disease with (acute) exacerbation: Principal | ICD-10-CM

## 2019-07-13 HISTORY — DX: Morbid (severe) obesity due to excess calories: E66.01

## 2019-07-13 LAB — BASIC METABOLIC PANEL
Anion gap: 6 (ref 5–15)
BUN: 36 mg/dL — ABNORMAL HIGH (ref 8–23)
CO2: 36 mmol/L — ABNORMAL HIGH (ref 22–32)
Calcium: 8.9 mg/dL (ref 8.9–10.3)
Chloride: 89 mmol/L — ABNORMAL LOW (ref 98–111)
Creatinine, Ser: 1.12 mg/dL — ABNORMAL HIGH (ref 0.44–1.00)
GFR calc Af Amer: 55 mL/min — ABNORMAL LOW (ref >60)
GFR calc non Af Amer: 47 mL/min — ABNORMAL LOW (ref >60)
Glucose, Bld: 116 mg/dL — ABNORMAL HIGH (ref 70–99)
Potassium: 4.3 mmol/L (ref 3.5–5.1)
Sodium: 131 mmol/L — ABNORMAL LOW (ref 135–145)

## 2019-07-13 MED ORDER — IPRATROPIUM-ALBUTEROL 0.5-2.5 (3) MG/3ML IN SOLN
3.0000 mL | Freq: Two times a day (BID) | RESPIRATORY_TRACT | Status: DC
  Administered 2019-07-13 – 2019-07-14 (×2): 3 mL via RESPIRATORY_TRACT
  Filled 2019-07-13 (×2): qty 3

## 2019-07-13 NOTE — Progress Notes (Signed)
Severe COPD; chronic respiratory failure with hypoxemia.  Patient continues to exhibit signs of hypercapnia associated with chronic respiratory failure secondary to severe COPD.  Patient requires the use of NIV both QHS and daytime to help with exacerbation periods.  The use of NIV will treat the patient's low PFT levels and can reduce risk of exacerbations and future hospitalizations when used at night and during the day.  Patient will need these advanced settings in conjunction with her current medication regimen; BIPAP with backup rate has been tried and failed.  Failure to have NIV available for use over a 24 hr period could lead to death.  Patient has been able to protect their airway and clear their own secretions.Marland Kitchen

## 2019-07-13 NOTE — Progress Notes (Signed)
Physical Therapy Treatment Patient Details Name: Kelly Hogan MRN: [id number] DOB: [birth year] Today's Date: 07/13/2019    History of Present Illness Pt is 79 y/o F with PMH: HTN, HLD, diet-controlled DM, COPD on 4Lnc, asthma, GERD, stomach ulcer, tobacco abuse, and CKD3. Pt presented SOB and was adm for COPD exacerbation.    PT Comments    Pt ready for session.  No c/o.  To EOB with supervision and rails.  Steady in sitting.  She is able to stand from raised bed with min guard.  Transferred to recliner with min guard and RW.  She is able to remain standing after transfer and marched in place for several steps before asking to sit due to fatigue.  She declines further mobility at this time and chose to remain with her feet down in recliner.  BP 156/62 prior to mobility.   Follow Up Recommendations  Home health PT     Equipment Recommendations  None recommended by PT    Recommendations for Other Services       Precautions / Restrictions Precautions Precautions: Fall Restrictions Weight Bearing Restrictions: No    Mobility  Bed Mobility Overal bed mobility: Modified Independent       Supine to sit: Modified independent (Device/Increase time);HOB elevated        Transfers Overall transfer level: Needs assistance Equipment used: Rolling walker (2 wheeled) Transfers: Sit to/from Stand Sit to Stand: Min guard;From elevated surface            Ambulation/Gait Ambulation/Gait assistance: Min guard Gait Distance (Feet): 5 Feet Assistive device: Rolling walker (2 wheeled) Gait Pattern/deviations: Step-to pattern Gait velocity: decreased   General Gait Details: able to transfer to chair and remain standing after for a few moments marching in place.   Stairs             Wheelchair Mobility    Modified Rankin (Stroke Patients Only)       Balance Overall balance assessment: Modified Independent Sitting-balance support: Feet supported Sitting  balance-Leahy Scale: Good     Standing balance support: Bilateral upper extremity supported Standing balance-Leahy Scale: Good Standing balance comment: No LOB with BUE support on RW                            Cognition Arousal/Alertness: Awake/alert Behavior During Therapy: WFL for tasks assessed/performed Overall Cognitive Status: Within Functional Limits for tasks assessed                                 General Comments: Pt was A and O x 4 and able to follow commands consistently. She was very pleasant throughout.      Exercises Other Exercises Other Exercises: marching in place with RW    General Comments        Pertinent Vitals/Pain Pain Assessment: No/denies pain    Home Living                      Prior Function            PT Goals (current goals can now be found in the care plan section) Progress towards PT goals: Progressing toward goals    Frequency    Min 2X/week      PT Plan Discharge plan needs to be updated    Co-evaluation  AM-PAC PT "6 Clicks" Mobility   Outcome Measure  Help needed turning from your back to your side while in a flat bed without using bedrails?: A Little Help needed moving from lying on your back to sitting on the side of a flat bed without using bedrails?: A Little Help needed moving to and from a bed to a chair (including a wheelchair)?: A Little Help needed standing up from a chair using your arms (e.g., wheelchair or bedside chair)?: A Little Help needed to walk in hospital room?: A Little Help needed climbing 3-5 steps with a railing? : A Little 6 Click Score: 18    End of Session Equipment Utilized During Treatment: Oxygen;Gait belt Activity Tolerance: Patient tolerated treatment well;Patient limited by fatigue Patient left: in chair;with chair alarm set;with call bell/phone within reach Nurse Communication: Mobility status       Time: 3299-2426 PT Time  Calculation (min) (ACUTE ONLY): 16 min  Charges:  $Gait Training: 8-22 mins                    Danielle Dess, PTA 07/13/19, 9:47 AM

## 2019-07-13 NOTE — TOC Progression Note (Signed)
Transition of Care [facility]) - Progression Note    Patient Details  Name: CAMEO SCHMIESING MRN: [id number] Date of Birth: 1940/06/13  Transition of Care [facility]) CM/SW Contact  Allayne Butcher, RN Phone Number: 07/13/2019, 10:19 AM  Clinical Narrative:    Nida Boatman with [employer] [facility] given referral for non invasive ventilator.  Patient's insurance requires prior authorization.  Brad with [employer] is starting insurance auth.   Expected Discharge Plan: Home w Home Health Services Barriers to Discharge: Continued Medical Work up  Expected Discharge Plan and Services Expected Discharge Plan: Home w Home Health Services In-house Referral: Clinical Social Work Discharge Planning Services: CM Consult Post Acute Care Choice: Durable Medical Equipment, Home Health Living arrangements for the past 2 months: Single Family Home                 DME Arranged: NIV DME Agency: [facility] Date DME Agency Contacted: 07/12/19 Time DME Agency Contacted: 1401 Representative spoke with at DME Agency: Mitchell Heir HH Arranged: RN, PT, OT, Nurse's Aide, Social Work [employer] Agency: [facility] Date [facility] Agency Contacted: 07/12/19 Time HH Agency Contacted: 1402 Representative spoke with at [facility] Agency: Kandee Keen   Social Determinants of Health (SDOH) Interventions    Readmission Risk Interventions No flowsheet data found.

## 2019-07-13 NOTE — Progress Notes (Signed)
PROGRESS NOTE  Kelly Hogan ZOX:[id number] DOB: 01/30/1941 DOA: 07/07/2019 PCP: Lianne Moris, MD  Brief History   79 year old woman PMH including COPD, chronic hypoxic respiratory failure on 4 L nasal cannula, CKD stage IIIa presented with shortness of breath, admitted for COPD exacerbation.  Found to have nocturnal oxygen desaturation while asleep, trialed on CPAP with improvement. Needs outpatient follow-up with pulmonology and completion of titration sleep study.  PFTs completed, being worked up for possible trilogy on discharge.  A & P  COPD exacerbation acute --Slowly improving.  Slow prednisone taper on discharge.  Follow-up with pulmonology within 1 week of discharge.  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  Followed by [facility] pulmonary.   --Acute component secondary to sleep apnea with desaturation in the 60s at night.  Appears stable during the day.  Obstructive sleep apnea, clinical diagnosis -- Had sleep study in March and is scheduled for titration study in May.  PFTs completed.  Assessing for eligibility for trilogy.  Preauthorization process.  Hyponatremia thought secondary to poor oral intakes and diuretics or SIADH.  Chronic dating back to at least July 2014.  Seen also January 2021 as well as this hospitalization.  TSH within normal limits. --Sodium stable.  No further evaluation suggested as an inpatient.  Follow-up as an outpatient.  Anemia of CKD no iron deficiency.  B12 elevated. --Stable.  Follow-up as an outpatient.  Hyperkalemia complicated by ARB --Resolved.  Essential hypertension --Labile.  CKD stage IIIa --Stable.  Chronic bilateral lower extremity edema --Treated with Ace wraps as an outpatient, followed by home health, seen by wound care here with recommendation for routine cleaning, legs wrapped with Kerlix, Ace wrap change daily.  Morbid obesity. Body  mass index is 53.04 kg/m.  Disposition Plan:  From: Home Anticipated disposition: Return home with family.  SNF recommended but patient refused.  Home health PT, OT, RN, nurses aide, social work, 3 in 1 bedside commode, tub/shower seat Discussion: Patient is slowly improving.  Continue bronchodilators, steroids.  Preauthorization process for trilogy given patient's significant desaturations at night and while sleeping.  Discharge home when trilogy obtained.  Could be today.  Status is: Inpatient  Remains inpatient appropriate because:As above, needs trilogy to maintain safe oxygenation   DVT prophylaxis: enoxaparin Code Status: Full Family Communication: none present or requested. Patient alert and understands plan.    Murray Hodgkins, MD  [facility] Direct contact: see [email or web address] (further directions at bottom of note if needed) 7PM-7AM contact night coverage as at bottom of note 07/13/2019, 9:41 AM  LOS: 6 days   Significant Hospital Events   . 4/22 admitted for COPD exacerbation . 4/24 oxygen desaturation.  Started on CPAP at night.   Consults:  . None   Procedures:  . None  Significant Diagnostic Tests:  . 4/22 chest x-ray no acute disease . Left lower extremity DVT negative . PFTs FEV1- Predicted: 1.65   Best: 0.43  Percent: 26.1 FVC-   Predicted: 2.13   Best: 1.02  Percent: 48.1 FEV1/FVC:  Predicted: 75.9  Best:41.9  Percent: 55.2   Micro Data:  . SARS Covid negative   Antimicrobials:  . None  Interval History/Subjective  Feels better, slept okay last night, used CPAP.  Objective   Vitals:  Vitals:   07/13/19 0732 07/13/19 0748  BP: (!) 188/74   Pulse:  71  Resp: 15 17  Temp: (!) 97.5 F (36.4  C)   SpO2: 95% 100%    Exam:  Constitutional.  Appears calm, comfortable sitting in chair. Respiratory.  Clear to auscultation bilaterally.  Fair air movement.  No frank wheezes, rales or rhonchi.  Normal respiratory effort. Cardiovascular.   Regular rate and rhythm.  No murmur, rub or gallop.  2-3+ bilateral lower extremity edema.  Legs wrapped in Kerlix.  Telemetry sinus rhythm. Psychiatric.  Grossly normal mood and affect.  Speech fluent and appropriate.  I have personally reviewed the following:   Today's Data  . Sodium stable at 131, modest hyperglycemia on steroids.  BUN and creatinine stable.  Scheduled Meds: . aspirin EC  81 mg Oral Daily  . carvedilol  12.5 mg Oral BID WC  . cholecalciferol  1,000 Units Oral Daily  . docusate sodium  100 mg Oral BID  . enoxaparin (LOVENOX) injection  40 mg Subcutaneous Q12H  . fluticasone  1 spray Each Nare Daily  . furosemide  40 mg Oral Daily  . hydrALAZINE  25 mg Oral BID  . ipratropium-albuterol  3 mL Nebulization Q6H  . mouth rinse  15 mL Mouth Rinse BID  . pantoprazole  40 mg Oral Daily  . predniSONE  60 mg Oral Q breakfast  . simvastatin  10 mg Oral QHS  . umeclidinium-vilanterol  1 puff Inhalation Daily   Continuous Infusions:  Principal Problem:   COPD exacerbation (HCC) Active Problems:   Chronic respiratory failure (HCC)   Acid reflux   Essential (primary) hypertension   HLD (hyperlipidemia)   CKD (chronic kidney disease), stage IIIa   Tobacco abuse   Generalized weakness   Hyponatremia   Hyperkalemia   Normocytic anemia   Acute on chronic respiratory failure with hypoxia (HCC)   LOS: 6 days   How to contact the [facility] Attending or Consulting provider 7A - 7P or covering provider during after hours 7P -7A, for this patient?  1. Check the care team in [facility] and look for a) attending/consulting TRH provider listed and b) the [facility] team listed 2. Log into [email or web address] and use [place]'s universal password to access. If you do not have the password, please contact the hospital operator. 3. Locate the [facility] provider you are looking for under [facility] and page to a number that you can be directly reached. 4. If you still have difficulty reaching the  provider, please page the [facility] (Director on Call) for the Hospitalists listed on amion for assistance.

## 2019-07-14 DIAGNOSIS — J9621 Acute and chronic respiratory failure with hypoxia: Secondary | ICD-10-CM

## 2019-07-14 DIAGNOSIS — E871 Hypo-osmolality and hyponatremia: Secondary | ICD-10-CM

## 2019-07-14 MED ORDER — SPIRONOLACTONE 25 MG PO TABS
25.0000 mg | ORAL_TABLET | Freq: Every day | ORAL | Status: DC
  Administered 2019-07-14 – 2019-07-18 (×5): 25 mg via ORAL
  Filled 2019-07-14 (×5): qty 1

## 2019-07-14 NOTE — Progress Notes (Signed)
Waiting for approval for trilogy

## 2019-07-14 NOTE — Progress Notes (Signed)
Physical Therapy Treatment Patient Details Name: Kelly Hogan MRN: [id number] DOB: 1941/02/08 Today's Date: 07/14/2019    History of Present Illness Pt is 79 y/o F with PMH: HTN, HLD, diet-controlled DM, COPD on 4Lnc, asthma, GERD, stomach ulcer, tobacco abuse, and CKD3. Pt presented SOB and was adm for COPD exacerbation.    PT Comments    Pt ready for session.  OOB with rail and HOB raised.  Steady in sitting.  She is able to stand and progress gait into hallway with good confidence and balance.  To commode upon return to room to void.  Overall progressing well with mobility and she is comfortable with discharge plan home.   Follow Up Recommendations  Home health PT     Equipment Recommendations  3in1 (PT)    Recommendations for Other Services       Precautions / Restrictions Precautions Precautions: Fall Restrictions Weight Bearing Restrictions: No    Mobility  Bed Mobility Overal bed mobility: Modified Independent Bed Mobility: Supine to Sit Rolling: Supervision   Supine to sit: Modified independent (Device/Increase time);HOB elevated        Transfers Overall transfer level: Needs assistance Equipment used: Rolling walker (2 wheeled) Transfers: Sit to/from Stand Sit to Stand: Min guard;From elevated surface            Ambulation/Gait Ambulation/Gait assistance: Min guard Gait Distance (Feet): 50 Feet Assistive device: Rolling walker (2 wheeled) Gait Pattern/deviations: Step-through pattern;Decreased step length - right;Wide base of support Gait velocity: decreased   General Gait Details: improved tolerance and progression of gait today   Stairs             Wheelchair Mobility    Modified Rankin (Stroke Patients Only)       Balance Overall balance assessment: Modified Independent Sitting-balance support: Feet supported Sitting balance-Leahy Scale: Good     Standing balance support: Bilateral upper extremity supported Standing  balance-Leahy Scale: Good Standing balance comment: No LOB with BUE support on RW                            Cognition Arousal/Alertness: Awake/alert Behavior During Therapy: WFL for tasks assessed/performed Overall Cognitive Status: Within Functional Limits for tasks assessed                                        Exercises Other Exercises Other Exercises: commode to void    General Comments        Pertinent Vitals/Pain Pain Assessment: No/denies pain    Home Living                      Prior Function            PT Goals (current goals can now be found in the care plan section) Progress towards PT goals: Progressing toward goals    Frequency    Min 2X/week      PT Plan Current plan remains appropriate    Co-evaluation              AM-PAC PT "6 Clicks" Mobility   Outcome Measure  Help needed turning from your back to your side while in a flat bed without using bedrails?: A Little Help needed moving from lying on your back to sitting on the side of a flat bed without using bedrails?: A Little Help  needed moving to and from a bed to a chair (including a wheelchair)?: A Little Help needed standing up from a chair using your arms (e.g., wheelchair or bedside chair)?: A Little Help needed to walk in hospital room?: A Little Help needed climbing 3-5 steps with a railing? : A Little 6 Click Score: 18    End of Session Equipment Utilized During Treatment: Oxygen;Gait belt Activity Tolerance: Patient tolerated treatment well Patient left: in chair;with chair alarm set;with call bell/phone within reach Nurse Communication: Mobility status       Time: 2641-5830 PT Time Calculation (min) (ACUTE ONLY): 23 min  Charges:  $Gait Training: 8-22 mins $Therapeutic Exercise: 8-22 mins                    Chesley Noon, PTA 07/14/19, 10:05 AM

## 2019-07-14 NOTE — Progress Notes (Signed)
Occupational Therapy Treatment Patient Details Name: Kelly Hogan MRN: [id number] DOB: 1940/12/09 Today's Date: 07/14/2019    History of present illness Pt is 79 y/o F with PMH: HTN, HLD, diet-controlled DM, COPD on 4Lnc, asthma, GERD, stomach ulcer, tobacco abuse, and CKD3. Pt presented SOB and was adm for COPD exacerbation.   OT comments  Kelly Hogan presents to OT pleasant and agreeable to today's session focused on self care.  OTR provided stand by assist for supine > sit, and setup assist for grooming ADLs while seated EOB.  Pt brushed teeth and washed face seated EOB.  Pt completed several sit to stand transfers from bed to adjust chuck pads and doff gown.  OTR provided stand by assist for sit to stands, with pt using rocking technique for momentum to stand.  OTR educated pt on energy conservation strategies to promote safety with ADLs and well being after discharge, including pacing, frequent rest breaks, activity modifications, and body mechanics.  Handout provided and pt verbalized understanding.  She has good insight into safety precautions and benefits of energy conservation strategies.  Pt will continue to benefit from skilled OT services in acute setting to address energy conservation, endurance, activity tolerance, strengthening, and safety in ADLs.  Changing recommendation to [facility] after discharge as pt has good insight and is making good progress towards her goals.     Follow Up Recommendations  Home health OT    Equipment Recommendations  3 in 1 bedside commode;Tub/shower seat    Recommendations for Other Services      Precautions / Restrictions Precautions Precautions: Fall Restrictions Weight Bearing Restrictions: No Other Position/Activity Restrictions: monitor SpO2       Mobility Bed Mobility Overal bed mobility: Modified Independent Bed Mobility: Supine to Sit;Sit to Supine     Supine to sit: Modified independent (Device/Increase time);HOB elevated Sit to  supine: Min assist   General bed mobility comments: Pt was able to exit R side of bed using bed rails and increased time. No physical assistance required. OTR provided min A for BLEs in supine > sit.  Transfers Overall transfer level: Needs assistance Equipment used: Rolling walker (2 wheeled) Transfers: Sit to/from Stand Sit to Stand: Min guard;From elevated surface         General transfer comment: Multiple sit to stands completed to change chuck pads and doff gown.  Pt completed with stand by assist, uses rocking technique to use momentum to stand.    Balance Overall balance assessment: Modified Independent Sitting-balance support: Feet supported Sitting balance-Leahy Scale: Good     Standing balance support: Bilateral upper extremity supported Standing balance-Leahy Scale: Good                             ADL either performed or assessed with clinical judgement   ADL                                         General ADL Comments: Pt generally requires setup/min assist for ADLs.  She requires setup assist for seated ADLs and min guard for standing ADLs, stand by assist recommended 2/2 limited tolerance and endurance.     Vision Baseline Vision/History: Wears glasses Wears Glasses: At all times Patient Visual Report: No change from baseline     Perception     Praxis      Cognition  Arousal/Alertness: Awake/alert Behavior During Therapy: WFL for tasks assessed/performed Overall Cognitive Status: Within Functional Limits for tasks assessed                                 General Comments: grossly oriented, pleasant and engaged throughout        Exercises Other Exercises Other Exercises: education provided re: self care, safety and fall precautions, functional transfers, energy conservation Other Exercises: OTR provided setup assist for grooming seated EOB Other Exercises: OTR provided stand by assist for sit to stands and  supine > sit, min A in sit > supine   Shoulder Instructions       General Comments Pt's HR 97 and SpO2 91% after supine > sit.  SpO2 increased with deep breathing.    Pertinent Vitals/ Pain       Pain Assessment: No/denies pain Pain Intervention(s): Limited activity within patient's tolerance;Monitored during session  Home Living                                          Prior Functioning/Environment              Frequency  Min 2X/week        Progress Toward Goals  OT Goals(current goals can now be found in the care plan section)  Progress towards OT goals: Progressing toward goals  Acute Rehab OT Goals Patient Stated Goal: to get home safely OT Goal Formulation: With patient/family Time For Goal Achievement: 07/24/19 Potential to Achieve Goals: Good  Plan Frequency remains appropriate;Discharge plan needs to be updated    Co-evaluation                 AM-PAC OT "6 Clicks" Daily Activity     Outcome Measure   Help from another person eating meals?: None Help from another person taking care of personal grooming?: A Little Help from another person toileting, which includes using toliet, bedpan, or urinal?: A Little Help from another person bathing (including washing, rinsing, drying)?: A Little Help from another person to put on and taking off regular upper body clothing?: None Help from another person to put on and taking off regular lower body clothing?: A Little 6 Click Score: 20    End of Session    OT Visit Diagnosis: Unsteadiness on feet (R26.81);Muscle weakness (generalized) (M62.81)   Activity Tolerance Patient tolerated treatment well   Patient Left with call bell/phone within reach;with bed alarm set;in bed   Nurse Communication Other (comment)(purewick collection full)        Time: 1425-1500 OT Time Calculation (min): 35 min  Charges: OT General Charges $OT Visit: 1 Visit OT Treatments $Self Care/Home  Management : 23-37 mins  Kelly Hogan, OTR/L 07/14/19, 3:39 PM

## 2019-07-14 NOTE — Care Management Important Message (Signed)
Important Message  Patient Details  Name: Kelly Hogan MRN: [id number] Date of Birth: [birth year]   Medicare Important Message Given:  Yes     [name] A Greidy Sherard 07/14/2019, 10:44 AM

## 2019-07-14 NOTE — Progress Notes (Signed)
PROGRESS NOTE  Kelly Hogan UOH:[id number] DOB: [birth year] DOA: 07/07/2019 PCP: Elvia Collum, MD  Brief History   79 year old woman PMH including COPD, chronic hypoxic respiratory failure on 4 L nasal cannula, CKD stage IIIa presented with shortness of breath, admitted for COPD exacerbation.  Found to have nocturnal oxygen desaturation while asleep, trialed on CPAP with improvement. Needs outpatient follow-up with pulmonology and completion of titration sleep study.  PFTs completed, being worked up for possible trilogy on discharge.  A & P  COPD exacerbation acute --He is to improve.  Continue prednisone taper.  Follow-up with pulmonology in 1 week.    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  Followed by [facility] pulmonary.   --Appears stable now.    Obstructive sleep apnea, clinical diagnosis -- Had sleep study in March and is scheduled for titration study in May.  PFTs completed.  Assessing for eligibility for trilogy.  Preauthorization process.  Hyponatremia thought secondary to poor oral intakes and diuretics or SIADH.  Chronic dating back to at least July 2014.  Seen also January 2021 as well as this hospitalization.  TSH within normal limits. --Sodium stable.  No further evaluation suggested as an inpatient.  Follow-up as an outpatient.  Anemia of CKD no iron deficiency.  B12 elevated. --Stable.  Follow-up as an outpatient.  Hyperkalemia complicated by ARB --Resolved.  Essential hypertension --Labile.  CKD stage IIIa --Stable.  Chronic bilateral lower extremity edema --Treated with Ace wraps as an outpatient, followed by home health, seen by wound care here with recommendation for routine cleaning, legs wrapped with Kerlix, Ace wrap change daily.  Morbid obesity. Body mass index is 53.04 kg/m.  Disposition Plan:  From: Home Anticipated disposition: Return home with family.   SNF recommended but patient refused.  Home health PT, OT, RN, nurses aide, social work, 3 in 1 bedside commode, tub/shower seat Discussion: Patient is slowly improving.  Continue bronchodilators, steroids.  Preauthorization process for trilogy given patient's significant desaturations at night and while sleeping.  Discharge home when trilogy obtained.    Status is: Inpatient  Remains inpatient appropriate because:As above, needs trilogy to maintain safe oxygenation   DVT prophylaxis: enoxaparin Code Status: Full Family Communication: none present or requested. Patient alert and understands plan.    Brendia Sacks, MD  [facility] Direct contact: see [email or web address] (further directions at bottom of note if needed) 7PM-7AM contact night coverage as at bottom of note 07/14/2019, 2:49 PM  LOS: 7 days   Significant Hospital Events   . 4/22 admitted for COPD exacerbation . 4/24 oxygen desaturation.  Started on CPAP at night.   Consults:  . None   Procedures:  . None  Significant Diagnostic Tests:  . 4/22 chest x-ray no acute disease . Left lower extremity DVT negative . PFTs FEV1- Predicted: 1.65   Best: 0.43  Percent: 26.1 FVC-   Predicted: 2.13   Best: 1.02  Percent: 48.1 FEV1/FVC:  Predicted: 75.9  Best:41.9  Percent: 55.2   Micro Data:  . SARS Covid negative   Antimicrobials:  . None  Interval History/Subjective  No apparent new issues.  Slept okay.  Did well with CPAP.  Objective   Vitals:  Vitals:   07/14/19 0727 07/14/19 0822  BP: (!) 166/67   Pulse: 73 75  Resp: 19 16  Temp: 98.1 F (36.7 C)   SpO2: 100% 98%    Exam:  Constitutional.  Appears calm, comfortable. Respiratory.  Clear to auscultation bilaterally.  No wheezes, rales or rhonchi.  Normal respiratory effort. Cardiovascular.  Regular rate and rhythm.  No murmur, rub or gallop. Psychiatric.  Grossly normal mood and affect.  Speech fluent and appropriate.  I have personally reviewed the  following:   Today's Data  . No new data  Scheduled Meds: . aspirin EC  81 mg Oral Daily  . carvedilol  12.5 mg Oral BID WC  . cholecalciferol  1,000 Units Oral Daily  . docusate sodium  100 mg Oral BID  . enoxaparin (LOVENOX) injection  40 mg Subcutaneous Q12H  . fluticasone  1 spray Each Nare Daily  . furosemide  40 mg Oral Daily  . hydrALAZINE  25 mg Oral BID  . mouth rinse  15 mL Mouth Rinse BID  . pantoprazole  40 mg Oral Daily  . predniSONE  60 mg Oral Q breakfast  . simvastatin  10 mg Oral QHS  . spironolactone  25 mg Oral Daily  . umeclidinium-vilanterol  1 puff Inhalation Daily   Continuous Infusions:  Principal Problem:   COPD exacerbation (HCC) Active Problems:   Chronic respiratory failure (HCC)   Acid reflux   Essential (primary) hypertension   HLD (hyperlipidemia)   CKD (chronic kidney disease), stage IIIa   Tobacco abuse   Generalized weakness   Hyponatremia   Hyperkalemia   Normocytic anemia   Acute on chronic respiratory failure with hypoxia (HCC)   Morbid obesity with BMI of 50.0-59.9, adult ([place])   LOS: 7 days   How to contact the [facility] Attending or Consulting provider 7A - 7P or covering provider during after hours [place], for this patient?  1. Check the care team in [facility] and look for a) attending/consulting TRH provider listed and b) the [facility] team listed 2. Log into [email or web address] and use [place]'s universal password to access. If you do not have the password, please contact the hospital operator. 3. Locate the [facility] provider you are looking for under [facility] and page to a number that you can be directly reached. 4. If you still have difficulty reaching the provider, please page the [facility] (Director on Call) for the Hospitalists listed on amion for assistance.

## 2019-07-14 NOTE — TOC Progression Note (Signed)
Transition of Care [facility]) - Progression Note    Patient Details  Name: Kelly Hogan MRN: [id number] Date of Birth: 1940-04-12  Transition of Care [facility]) CM/SW Contact  Allayne Butcher, RN Phone Number: 07/14/2019, 2:36 PM  Clinical Narrative:     Patient updated on plan of care.  [employer] is waiting on insurance authorization for the NIV.  If insurance denies the authorization patient will have to go home without the NIV and wait until May to follow up with pulmonary.   Patient updated that home health services have been arranged when she is ready for discharge.   Expected Discharge Plan: Home w Home Health Services Barriers to Discharge: Continued Medical Work up  Expected Discharge Plan and Services Expected Discharge Plan: Home w Home Health Services In-house Referral: Clinical Social Work Discharge Planning Services: CM Consult Post Acute Care Choice: Durable Medical Equipment, Home Health Living arrangements for the past 2 months: Single Family Home                 DME Arranged: NIV DME Agency: [employer] Date DME Agency Contacted: 07/12/19 Time DME Agency Contacted: 1401 Representative spoke with at DME Agency: Mitchell Heir HH Arranged: RN, PT, OT, Nurse's Aide, Social Work [employer] Agency: [facility] Date [facility] Agency Contacted: 07/12/19 Time HH Agency Contacted: 1402 Representative spoke with at [facility] Agency: Kandee Keen   Social Determinants of Health (SDOH) Interventions    Readmission Risk Interventions No flowsheet data found.

## 2019-07-15 DIAGNOSIS — G4733 Obstructive sleep apnea (adult) (pediatric): Secondary | ICD-10-CM

## 2019-07-15 DIAGNOSIS — E662 Morbid (severe) obesity with alveolar hypoventilation: Secondary | ICD-10-CM

## 2019-07-15 MED ORDER — BISACODYL 10 MG RE SUPP
10.0000 mg | Freq: Once | RECTAL | Status: AC
  Administered 2019-07-15: 10 mg via RECTAL
  Filled 2019-07-15: qty 1

## 2019-07-15 MED ORDER — POLYETHYLENE GLYCOL 3350 17 G PO PACK
17.0000 g | PACK | Freq: Two times a day (BID) | ORAL | Status: DC
  Administered 2019-07-15 – 2019-07-18 (×3): 17 g via ORAL
  Filled 2019-07-15 (×5): qty 1

## 2019-07-15 MED ORDER — SENNA 8.6 MG PO TABS
1.0000 | ORAL_TABLET | Freq: Every day | ORAL | Status: DC
  Administered 2019-07-15 – 2019-07-17 (×3): 8.6 mg via ORAL
  Filled 2019-07-15 (×3): qty 1

## 2019-07-15 NOTE — Progress Notes (Signed)
PROGRESS NOTE  ARVA SLAUGH LOV:[id number] DOB: [birth year] DOA: 07/07/2019 PCP: Lianne Moris, MD  Brief History   79 year old woman PMH including COPD, chronic hypoxic respiratory failure on 4 L nasal cannula, CKD stage IIIa presented with shortness of breath, admitted for COPD exacerbation.  Found to have nocturnal oxygen desaturation while asleep, trialed on CPAP with improvement. Needs outpatient follow-up with pulmonology and completion of titration sleep study.  PFTs completed, being worked up for possible trilogy on discharge.  A & P  COPD exacerbation acute --Continues to improve, continue prednisone taper.  Follow-up with pulmonology next week.  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  Followed by [facility] pulmonary.   --Stable on nasal cannula  Obstructive sleep apnea, clinical diagnosis -- Had sleep study in March and is scheduled for titration study in May.  PFTs completed.  Assessing for eligibility for trilogy.  --In my opinion the patient's consideration for trilogy warrants an expedited evaluation as she is at high risk for rehospitalization without it given marked desaturations, high risk clinically at home without.  Hyponatremia thought secondary to poor oral intakes and diuretics or SIADH.  Chronic dating back to at least July 2014.  Seen also January 2021 as well as this hospitalization.  TSH within normal limits. --Sodium stable.  No further evaluation suggested as an inpatient.  Follow-up as an outpatient.  Anemia of CKD no iron deficiency.  B12 elevated. --Stable.  Follow-up as an outpatient.  Hyperkalemia complicated by ARB --Resolved.  Essential hypertension --Labile.  CKD stage IIIa --Stable.  Chronic bilateral lower extremity edema --Treated with Ace wraps as an outpatient, followed by home health, seen by wound care here with recommendation for routine cleaning,  legs wrapped with Kerlix, Ace wrap change daily.  Morbid obesity. Body mass index is 53.04 kg/m.  Disposition Plan:  From: Home Anticipated disposition: Return home with family.  SNF recommended but patient refused.  Home health PT, OT, RN, nurses aide, social work, 3 in 1 bedside commode, tub/shower seat Discussion: Patient is slowly improving.  Continue bronchodilators, steroids.  Preauthorization process for trilogy given patient's significant desaturations at night and while sleeping.  Discharge home when trilogy obtained.    Status is: Inpatient  Remains inpatient appropriate because:As above, needs trilogy to maintain safe oxygenation   DVT prophylaxis: enoxaparin Code Status: Full Family Communication: none present or requested. Patient alert and understands plan.    Murray Hodgkins, MD  [facility] Direct contact: see [email or web address] (further directions at bottom of note if needed) 7PM-7AM contact night coverage as at bottom of note 07/15/2019, 4:36 PM  LOS: 8 days   Significant Hospital Events   . 4/22 admitted for COPD exacerbation . 4/24 oxygen desaturation.  Started on CPAP at night.   Consults:  . None   Procedures:  . None  Significant Diagnostic Tests:  . 4/22 chest x-ray no acute disease . Left lower extremity DVT negative . PFTs FEV1- Predicted: 1.65   Best: 0.43  Percent: 26.1 FVC-   Predicted: 2.13   Best: 1.02  Percent: 48.1 FEV1/FVC:  Predicted: 75.9  Best:41.9  Percent: 55.2   Micro Data:  . SARS Covid negative   Antimicrobials:  . None  Interval History/Subjective  Feels fine, breathing fine now, slept well with positive pressure ventilation.  Tolerating diet.  Large bowel movement.  Objective   Vitals:  Vitals:   07/15/19 0732 07/15/19 1632  BP: Marland Kitchen)  180/74 137/69  Pulse: 64 68  Resp: 19 18  Temp: 98.1 F (36.7 C) 97.9 F (36.6 C)  SpO2: 100% 100%    Exam:  Constitutional.  Appears calm, comfortable. Respiratory.  Clear to  auscultation bilaterally.  No wheezes, rales or rhonchi.  Normal respiratory effort. Cardiovascular.  Regular rate and rhythm.  No murmur, rub or gallop.  2+ bilateral lower extremity edema. Psychiatric.  Grossly normal mood and affect.  Speech fluent and appropriate.  I have personally reviewed the following:   Today's Data  . No new data  Scheduled Meds: . aspirin EC  81 mg Oral Daily  . carvedilol  12.5 mg Oral BID WC  . cholecalciferol  1,000 Units Oral Daily  . docusate sodium  100 mg Oral BID  . enoxaparin (LOVENOX) injection  40 mg Subcutaneous Q12H  . fluticasone  1 spray Each Nare Daily  . furosemide  40 mg Oral Daily  . hydrALAZINE  25 mg Oral BID  . mouth rinse  15 mL Mouth Rinse BID  . pantoprazole  40 mg Oral Daily  . polyethylene glycol  17 g Oral BID  . predniSONE  60 mg Oral Q breakfast  . senna  1 tablet Oral QHS  . simvastatin  10 mg Oral QHS  . spironolactone  25 mg Oral Daily  . umeclidinium-vilanterol  1 puff Inhalation Daily   Continuous Infusions:  Principal Problem:   COPD exacerbation (HCC) Active Problems:   Chronic respiratory failure (HCC)   Acid reflux   Essential (primary) hypertension   HLD (hyperlipidemia)   CKD (chronic kidney disease), stage IIIa   Tobacco abuse   Generalized weakness   Hyponatremia   Hyperkalemia   Normocytic anemia   Acute on chronic respiratory failure with hypoxia (HCC)   Morbid obesity with BMI of 50.0-59.9, adult (HCC)   LOS: 8 days   How to contact the [facility] Attending or Consulting provider 7A - 7P or covering provider during after hours 7P -7A, for this patient?  1. Check the care team in [facility] and look for a) attending/consulting TRH provider listed and b) the [facility] team listed 2. Log into [email or web address] and use [place]'s universal password to access. If you do not have the password, please contact the hospital operator. 3. Locate the [facility] provider you are looking for under [facility] and page to a  number that you can be directly reached. 4. If you still have difficulty reaching the provider, please page the [facility] (Director on Call) for the Hospitalists listed on amion for assistance.

## 2019-07-15 NOTE — Progress Notes (Signed)
Physical Therapy Treatment Patient Details Name: Kelly Hogan MRN: [id number] DOB: Mar 31, 1940 Today's Date: 07/15/2019    History of Present Illness Pt is 79 y/o F with PMH: HTN, HLD, diet-controlled DM, COPD on 4Lnc, asthma, GERD, stomach ulcer, tobacco abuse, and CKD3. Pt presented SOB and was adm for COPD exacerbation.    PT Comments    Pt alert, requested to attempt to use BSC with PT assist. The patient demonstrated bed mobility and sit <> stand, stand pivot transfer mod I with RW (HOB elevated, bed surface slightly elevated). The patient did not exhibit any unsteadiness, able to navigate RW well. Pt did doff oxygen prior to mobility, 82% on room air, [place] replaced and spO2 92% quickly. Pt requested to sit for "a while", given call bed to call for help when she is ready to get up. Pt agreeable to sit in recliner after BSC use. The patient continues to demonstrate improvement in mobility, current plan remains appropriate.     Follow Up Recommendations  Home health PT     Equipment Recommendations  3in1 (PT)    Recommendations for Other Services       Precautions / Restrictions Precautions Precautions: Fall Restrictions Weight Bearing Restrictions: No Other Position/Activity Restrictions: monitor SpO2    Mobility  Bed Mobility Overal bed mobility: Modified Independent             General bed mobility comments: Pt did use bed rails to perform supine to sit with HOB elevated, mod I. Pt needed bed elevated slightly for sit to stand transfer but able to do without physical assist  Transfers Overall transfer level: Modified independent Equipment used: Rolling walker (2 wheeled) Transfers: Pharmacologist;Sit to/from Stand              Ambulation/Gait             General Gait Details: Pt requested to sit "a while" on [facility], given call bed to call for assistance.   Stairs             Wheelchair Mobility    Modified Rankin (Stroke Patients  Only)       Balance Overall balance assessment: Modified Independent Sitting-balance support: Feet supported Sitting balance-Leahy Scale: Good     Standing balance support: Bilateral upper extremity supported Standing balance-Leahy Scale: Good                              Cognition Arousal/Alertness: Awake/alert Behavior During Therapy: WFL for tasks assessed/performed Overall Cognitive Status: Within Functional Limits for tasks assessed                                 General Comments: grossly oriented, pleasant and engaged throughout      Exercises      General Comments General comments (skin integrity, edema, etc.): Pt took oxygen off for transfer to [facility]. on room air, 82%, replaced O2 and quickly recovered to 97%      Pertinent Vitals/Pain Pain Assessment: No/denies pain    Home Living                      Prior Function            PT Goals (current goals can now be found in the care plan section) Progress towards PT goals: Progressing toward goals    Frequency  Min 2X/week      PT Plan Current plan remains appropriate    Co-evaluation              AM-PAC PT "6 Clicks" Mobility   Outcome Measure  Help needed turning from your back to your side while in a flat bed without using bedrails?: A Little Help needed moving from lying on your back to sitting on the side of a flat bed without using bedrails?: A Little Help needed moving to and from a bed to a chair (including a wheelchair)?: A Little Help needed standing up from a chair using your arms (e.g., wheelchair or bedside chair)?: A Little Help needed to walk in hospital room?: A Little Help needed climbing 3-5 steps with a railing? : A Little 6 Click Score: 18    End of Session Equipment Utilized During Treatment: Oxygen;Gait belt(6L) Activity Tolerance: Patient tolerated treatment well Patient left: Other (comment)(on BSC with call bell) Nurse  Communication: Mobility status PT Visit Diagnosis: Unsteadiness on feet (R26.81);Muscle weakness (generalized) (M62.81);Difficulty in walking, not elsewhere classified (R26.2)     Time: 5465-6812 PT Time Calculation (min) (ACUTE ONLY): 13 min  Charges:  $Therapeutic Activity: 8-22 mins                     Lieutenant Diego PT, DPT 9:50 AM,07/15/19

## 2019-07-16 NOTE — Progress Notes (Signed)
PROGRESS NOTE  Kelly Hogan ZDG:[id number] DOB: 1940-09-11 DOA: 07/07/2019 PCP: Elvia Collum, MD  Brief History   79 year old woman PMH including COPD, chronic hypoxic respiratory failure on 4 L nasal cannula, CKD stage IIIa presented with shortness of breath, admitted for COPD exacerbation.  Found to have nocturnal oxygen desaturation while asleep, trialed on CPAP with improvement. Needs outpatient follow-up with pulmonology and completion of titration sleep study.  PFTs completed, being worked up for possible trilogy on discharge.  A & P  COPD exacerbation acute --Appears resolved.  Continue prednisone taper.  Follow-up with pulmonology next week.  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  Followed by [facility] pulmonary.   --Remains stable on nasal cannula  Obstructive sleep apnea, clinical diagnosis -- Had sleep study in March and is scheduled for titration study in May.  PFTs completed.  Assessing for eligibility for trilogy.  --In my opinion the patient's consideration for trilogy warrants an expedited evaluation as she is at high risk for rehospitalization without it given marked desaturations, high risk clinically at home without. --Discussed with case management yesterday.  Continue to await trilogy.  Hyponatremia thought secondary to poor oral intakes and diuretics or SIADH.  Chronic dating back to at least July 2014.  Seen also January 2021 as well as this hospitalization.  TSH within normal limits. --Sodium stable.  No further evaluation suggested as an inpatient.  Follow-up as an outpatient.  Anemia of CKD no iron deficiency.  B12 elevated. --Stable.  Follow-up as an outpatient.  Hyperkalemia complicated by ARB --Resolved.  Essential hypertension --Labile.  CKD stage IIIa --Stable.  Chronic bilateral lower extremity edema --Treated with Ace wraps as an outpatient, followed by  home health, seen by wound care here with recommendation for routine cleaning, legs wrapped with Kerlix, Ace wrap change daily.  Morbid obesity. Body mass index is 53.04 kg/m.  Disposition Plan:  From: Home Anticipated disposition: Return home with family.  SNF recommended but patient refused.  Home health PT, OT, RN, nurses aide, social work, 3 in 1 bedside commode, tub/shower seat Discussion: Appears stable.  Continue bronchodilators, steroids.  Preauthorization process for trilogy given patient's significant desaturations at night and while sleeping.  Discharge home when trilogy obtained.    Status is: Inpatient  Remains inpatient appropriate because:As above, needs trilogy to maintain safe oxygenation   DVT prophylaxis: enoxaparin Code Status: Full Family Communication: none present or requested. Patient alert and understands plan.    Brendia Sacks, MD  [facility] Direct contact: see [email or web address] (further directions at bottom of note if needed) 7PM-7AM contact night coverage as at bottom of note 07/16/2019, 12:38 PM  LOS: 9 days   Significant Hospital Events   . 4/22 admitted for COPD exacerbation . 4/24 oxygen desaturation.  Started on CPAP at night.   Consults:  . None   Procedures:  . None  Significant Diagnostic Tests:  . 4/22 chest x-ray no acute disease . Left lower extremity DVT negative . PFTs FEV1- Predicted: 1.65   Best: 0.43  Percent: 26.1 FVC-   Predicted: 2.13   Best: 1.02  Percent: 48.1 FEV1/FVC:  Predicted: 75.9  Best:41.9  Percent: 55.2   Micro Data:  . SARS Covid negative   Antimicrobials:  . None  Interval History/Subjective  No new issues.  Tolerating CPAP at night well.  Objective   Vitals:  Vitals:   07/16/19 0902 07/16/19 0911  BP:  Marland Kitchen)  127/51  Pulse:  79  Resp:  15  Temp: 98.2 F (36.8 C) 98.2 F (36.8 C)  SpO2:  100%    Exam:  Constitutional.  Appears calm, comfortable. Respiratory.  Clear to auscultation  bilaterally.  No wheezes, rales or rhonchi. Cardiovascular.  Regular rate and rhythm.  No murmur, rub or gallop. Psychiatric.  Grossly normal mood and affect.  Speech fluent and appropriate.  I have personally reviewed the following:   Today's Data  . No new data  Scheduled Meds: . aspirin EC  81 mg Oral Daily  . carvedilol  12.5 mg Oral BID WC  . cholecalciferol  1,000 Units Oral Daily  . docusate sodium  100 mg Oral BID  . enoxaparin (LOVENOX) injection  40 mg Subcutaneous Q12H  . fluticasone  1 spray Each Nare Daily  . furosemide  40 mg Oral Daily  . hydrALAZINE  25 mg Oral BID  . mouth rinse  15 mL Mouth Rinse BID  . pantoprazole  40 mg Oral Daily  . polyethylene glycol  17 g Oral BID  . predniSONE  60 mg Oral Q breakfast  . senna  1 tablet Oral QHS  . simvastatin  10 mg Oral QHS  . spironolactone  25 mg Oral Daily  . umeclidinium-vilanterol  1 puff Inhalation Daily   Continuous Infusions:  Principal Problem:   COPD exacerbation (HCC) Active Problems:   Chronic respiratory failure (HCC)   Acid reflux   Essential (primary) hypertension   HLD (hyperlipidemia)   CKD (chronic kidney disease), stage IIIa   Tobacco abuse   Generalized weakness   Hyponatremia   Hyperkalemia   Normocytic anemia   Acute on chronic respiratory failure with hypoxia (HCC)   Morbid obesity with BMI of 50.0-59.9, adult ([place])   LOS: 9 days   How to contact the [facility] Attending or Consulting provider 7A - 7P or covering provider during after hours [place], for this patient?  1. Check the care team in [facility] and look for a) attending/consulting TRH provider listed and b) the [facility] team listed 2. Log into [email or web address] and use [place]'s universal password to access. If you do not have the password, please contact the hospital operator. 3. Locate the [facility] provider you are looking for under [facility] and page to a number that you can be directly reached. 4. If you still have difficulty reaching  the provider, please page the [facility] (Director on Call) for the Hospitalists listed on amion for assistance.

## 2019-07-17 DIAGNOSIS — G4733 Obstructive sleep apnea (adult) (pediatric): Secondary | ICD-10-CM

## 2019-07-17 DIAGNOSIS — E662 Morbid (severe) obesity with alveolar hypoventilation: Secondary | ICD-10-CM

## 2019-07-17 MED ORDER — PREDNISONE 20 MG PO TABS
40.0000 mg | ORAL_TABLET | Freq: Every day | ORAL | Status: DC
  Administered 2019-07-18: 40 mg via ORAL
  Filled 2019-07-17: qty 2

## 2019-07-17 NOTE — Progress Notes (Signed)
PROGRESS NOTE  Kelly Hogan IRJ:[id number] DOB: [birth year] DOA: 07/07/2019 PCP: Elvia Collum, MD  Brief History   79 year old woman PMH including COPD, chronic hypoxic respiratory failure on 4 L nasal cannula, CKD stage IIIa presented with shortness of breath, admitted for COPD exacerbation.  Found to have nocturnal oxygen desaturation while asleep, trialed on CPAP with improvement. Needs outpatient follow-up with pulmonology and completion of titration sleep study.  PFTs completed, being worked up for possible trilogy on discharge.  A & P  COPD exacerbation acute --Appears resolved.  Decrease prednisone.  Follow-up with pulmonology next week.  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  Followed by [facility] pulmonary.   --Stable on nasal cannula during the day, CPAP at night  Obstructive sleep apnea, clinical diagnosis -- Had sleep study in March and is scheduled for titration study in May.  PFTs completed.  Assessing for eligibility for trilogy.  --In my opinion the patient's consideration for trilogy warrants an expedited evaluation as she is at high risk for rehospitalization without it given marked desaturations, high risk clinically at home without. --Discussed with case management 5/1.  Continue to await trilogy.  Hyponatremia thought secondary to poor oral intakes and diuretics or SIADH.  Chronic dating back to at least July 2014.  Seen also January 2021 as well as this hospitalization.  TSH within normal limits. --Sodium stable.  No further evaluation suggested as an inpatient.  Follow-up as an outpatient.  Anemia of CKD no iron deficiency.  B12 elevated. --Stable.  Follow-up as an outpatient.  Hyperkalemia complicated by ARB --Resolved.  Essential hypertension --Labile.  CKD stage IIIa --Stable.  Chronic bilateral lower extremity edema --Treated with Ace wraps as an outpatient,  followed by home health, seen by wound care here with recommendation for routine cleaning, legs wrapped with Kerlix, Ace wrap change daily.  Morbid obesity. Body mass index is 53.04 kg/m.  Disposition Plan:  From: Home Anticipated disposition: Return home with family.  SNF recommended but patient refused.  Home health PT, OT, RN, nurses aide, social work, 3 in 1 bedside commode, tub/shower seat Discussion: Appears stable.  Continue bronchodilators, steroids.  Preauthorization process for trilogy given patient's significant desaturations at night and while sleeping.  Discharge home when trilogy obtained.    Status is: Inpatient  Remains inpatient appropriate because:As above, needs trilogy to maintain safe oxygenation   DVT prophylaxis: enoxaparin Code Status: Full Family Communication: Son at bedside   Brendia Sacks, MD  [facility] Direct contact: see [email or web address] (further directions at bottom of note if needed) 7PM-7AM contact night coverage as at bottom of note 07/17/2019, 2:21 PM  LOS: 10 days   Significant Hospital Events   . 4/22 admitted for COPD exacerbation . 4/24 oxygen desaturation.  Started on CPAP at night.   Consults:  . None   Procedures:  . None  Significant Diagnostic Tests:  . 4/22 chest x-ray no acute disease . Left lower extremity DVT negative . PFTs FEV1- Predicted: 1.65   Best: 0.43  Percent: 26.1 FVC-   Predicted: 2.13   Best: 1.02  Percent: 48.1 FEV1/FVC:  Predicted: 75.9  Best:41.9  Percent: 55.2   Micro Data:  . SARS Covid negative   Antimicrobials:  . None  Interval History/Subjective  Seems to be doing well.  Tolerating diet.  Breathing all right.  Objective   Vitals:  Vitals:   07/17/19 0028 07/17/19 0739  BP:  127/63 (!) 163/78  Pulse: 77 83  Resp: 18 16  Temp: 97.6 F (36.4 C) 97.7 F (36.5 C)  SpO2: 91% 97%    Exam:  Constitutional.  Appears calm and comfortable. Cardiovascular.  Regular rate and rhythm.  No  murmur, rub or gallop. Respiratory.  Clear to auscultation bilaterally.  No wheezes, rales or rhonchi.  Normal respiratory effort. Psychiatric.  Grossly normal mood and affect.  Speech fluent and appropriate.  I have personally reviewed the following:   Today's Data  . No new data  Scheduled Meds: . aspirin EC  81 mg Oral Daily  . carvedilol  12.5 mg Oral BID WC  . cholecalciferol  1,000 Units Oral Daily  . docusate sodium  100 mg Oral BID  . enoxaparin (LOVENOX) injection  40 mg Subcutaneous Q12H  . fluticasone  1 spray Each Nare Daily  . furosemide  40 mg Oral Daily  . hydrALAZINE  25 mg Oral BID  . mouth rinse  15 mL Mouth Rinse BID  . pantoprazole  40 mg Oral Daily  . polyethylene glycol  17 g Oral BID  . [START ON 07/18/2019] predniSONE  40 mg Oral Q breakfast  . senna  1 tablet Oral QHS  . simvastatin  10 mg Oral QHS  . spironolactone  25 mg Oral Daily  . umeclidinium-vilanterol  1 puff Inhalation Daily   Continuous Infusions:  Principal Problem:   COPD exacerbation (HCC) Active Problems:   Chronic respiratory failure (HCC)   Acid reflux   Essential (primary) hypertension   HLD (hyperlipidemia)   CKD (chronic kidney disease), stage IIIa   Tobacco abuse   Generalized weakness   Hyponatremia   Hyperkalemia   Normocytic anemia   Acute on chronic respiratory failure with hypoxia (HCC)   Morbid obesity with BMI of 50.0-59.9, adult ([place])   LOS: 10 days   How to contact the [facility] Attending or Consulting provider 7A - 7P or covering provider during after hours [place], for this patient?  1. Check the care team in [facility] and look for a) attending/consulting TRH provider listed and b) the [facility] team listed 2. Log into [email or web address] and use [place]'s universal password to access. If you do not have the password, please contact the hospital operator. 3. Locate the [facility] provider you are looking for under [facility] and page to a number that you can be directly  reached. 4. If you still have difficulty reaching the provider, please page the [facility] (Director on Call) for the Hospitalists listed on amion for assistance.

## 2019-07-18 MED ORDER — CALCIUM CARBONATE ANTACID 500 MG PO CHEW: 1.0000 | CHEWABLE_TABLET | Freq: Three times a day (TID) | ORAL | Status: DC | PRN

## 2019-07-18 MED ORDER — PREDNISONE 10 MG PO TABS: ORAL_TABLET | ORAL | 0 refills | Status: AC

## 2019-07-18 NOTE — TOC Transition Note (Signed)
Transition of Care [facility]) - CM/SW Discharge Note   Patient Details  Name: Kelly Hogan MRN: [id number] Date of Birth: 03-05-1941  Transition of Care [facility]) CM/SW Contact:  Allayne Butcher, RN Phone Number: 07/18/2019, 11:52 AM   Clinical Narrative:    [facility] [facility] denied authorization for the non invasive ventilator.  Patient is scheduled for her titration study this Friday.  Patient is medically ready for discharge home, home health services have been arranged with Delila Spence aware of discharge today.  Patient reports that her brother will be picking her up today around 3 this afternoon.  Patient has a bedside commode and walker at home.    Final next level of care: Home w Home Health Services Barriers to Discharge: Barriers Resolved   Patient Goals and CMS Choice Patient states their goals for this hospitalization and ongoing recovery are:: to go home CMS Medicare.gov Compare Post Acute Care list provided to:: Patient Choice offered to / list presented to : Patient  Discharge Placement                       Discharge Plan and Services In-house Referral: Clinical Social Work Discharge Planning Services: CM Consult Post Acute Care Choice: Durable Medical Equipment, Home Health          DME Arranged: NIV DME Agency: [employer] Date DME Agency Contacted: 07/12/19 Time DME Agency Contacted: 1401 Representative spoke with at DME Agency: Mitchell Heir HH Arranged: RN, PT, OT, Nurse's Aide, Social Work [employer] Agency: [facility] Date [facility] Agency Contacted: 07/18/19 Time HH Agency Contacted: 1152 Representative spoke with at [facility] Agency: Kandee Keen  Social Determinants of Health (SDOH) Interventions     Readmission Risk Interventions No flowsheet data found.

## 2019-07-18 NOTE — Discharge Summary (Signed)
Physician Discharge Summary  Kelly Hogan [id number] DOB: [birth year] DOA: 07/07/2019  PCP: Elvia Collum, MD  Admit date: 07/07/2019 Discharge date: 07/18/2019  Recommendations for Outpatient Follow-up:   Obstructive sleep apnea, clinical diagnosis -- Had sleep study in March and is scheduled for titration study in May.  PFTs completed.    Assessed for eligibility for trilogy, denied by insurance company.  Hyponatremia thought secondary to poor oral intakes and diuretics or SIADH.  Chronic dating back to at least July 2014.  Seen also January 2021 as well as this hospitalization.  TSH within normal limits. --Sodium stable.  No further evaluation suggested as an inpatient.  Follow-up as an outpatient.   Follow-up Information    Elvia Collum, MD. Schedule an appointment as soon as possible for a visit in 2 weeks.   Specialty: Internal Medicine Contact information: [address] [id number] [place] [address] [phone]        Agapito Games, MD. Schedule an appointment as soon as possible for a visit in 1 week.   Contact information: [address] [place] [address] [phone]            Discharge Diagnoses: Principal diagnosis is #1 1. Acute COPD exacerbation acute 2. 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  3. Obstructive sleep apnea, clinical diagnosis 4. Hyponatremia  5. Anemia of CKD no iron deficiency.   6. Hyperkalemia complicated by ARB 7. Essential hypertension 8. CKD stage IIIa 9. Chronic bilateral lower extremity edema 10. Morbid obesity. Body mass index is 53.04 kg/m.   Discharge Condition: improved Disposition: home with HHPT  Diet recommendation: heart healthy  Filed Weights   07/09/19 0305 07/10/19 0314 07/11/19 0413  Weight: (!) 140.6 kg (!) 141.1 kg (!) 140.2 kg    History of present illness:  79 year old woman PMH including COPD, chronic hypoxic respiratory failure on 4 L nasal cannula, CKD stage IIIa presented with shortness of breath, admitted for COPD exacerbation.   Hospital Course:  Treated for COPD exacerbation with gradual clinical improvement. Found to have nocturnal oxygen desaturation while asleep, trialed on CPAP with improvement.  Known obstructive sleep apnea, had already been set up as an outpatient for sleep titration study.  She seemed to do well with CPAP and therefore trilogy was pursued, this was however denied by her insurance company.  Hospitalization was prolonged awaiting insurance decision.  Needs outpatient follow-up with pulmonology and completion of titration sleep study.  This is scheduled for May 7.   Her respiratory status appears quite stable and expect she will be stable for outpatient follow-up with her pulmonologist and sleep study.  Expect she will eventually qualify for CPAP or NIV, however at this point will expect she will remain stable in the short-term.  COPD exacerbation acute --Appears resolved.  Continue prednisone taper on discharge.  Follow-up with pulmonology within 1 week of discharge.  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  Followed by [facility] pulmonary.   --It should be noted that the desaturation into the 60s was off any supplemental oxygen which is not surprising given her COPD.  Obstructive sleep apnea, clinical diagnosis -- Had sleep study in March and is scheduled for titration study in May.  PFTs completed.    Assessed for eligibility for trilogy, denied by insurance company.  Hyponatremia thought secondary to poor oral intakes and diuretics or SIADH.  Chronic dating back to at least July 2014.  Seen also January 2021 as well as this hospitalization.  TSH within normal limits. --Sodium stable.  No further evaluation suggested as an inpatient.   Follow-up as an outpatient.  Anemia of CKD no iron deficiency.  B12 elevated. --Stable.  Follow-up as an outpatient.  Hyperkalemia complicated by ARB --Resolved.  Essential hypertension --Labile.  CKD stage IIIa --Stable.  Chronic bilateral lower extremity edema --Treated with Ace wraps as an outpatient, followed by home health, seen by wound care here with recommendation for routine cleaning, legs wrapped with Kerlix, Ace wrap change daily.  Morbid obesity. Body mass index is 53.04 kg/m.  Significant Hospital Events    4/22 admitted for COPD exacerbation  4/24 oxygen desaturation.  Started on CPAP at night.  Consults:   None  Procedures:   None  Significant Diagnostic Tests:   4/22 chest x-ray no acute disease  Left lower extremity DVT negative  PFTs FEV1- Predicted: 1.65 Best: 0.43 Percent: 26.1 FVC- Predicted: 2.13 Best: 1.02 Percent: 48.1 FEV1/FVC: Predicted: 75.9 Best:41.9 Percent: 55.2  Micro Data:   SARS Covid negative  Antimicrobials:   None   Today's assessment: S: No issues overnight.  Feels okay. O: Vitals:  Vitals:   07/18/19 0814 07/18/19 0925  BP: (!) 115/59 (!) 113/93  Pulse: 75 79  Resp: 18 19  Temp: 98.1 F (36.7 C) 98.2 F (36.8 C)  SpO2: 100% 100%    Constitutional:  . Appears calm and comfortable sitting in chair. Respiratory:  . CTA bilaterally, no w/r/r.  . Respiratory effort normal. Cardiovascular:  . RRR, no m/r/g Abdomen:  . Abdomen appears normal; no tenderness or masses . No hernias . No HSM Psychiatric:  . judgement and insight appear normal . Mental status o Mood, affect appropriate  No new data  Discharge Instructions  Discharge Instructions    Diet - low sodium heart healthy   Complete by: As directed    Discharge instructions   Complete by: As directed    Call your physician or seek immediate medical attention for shortness of breath, swelling, confusion, worsening of  condition.  Use 5-6 L of oxygen at night while asleep.   Increase activity slowly   Complete by: As directed      Allergies as of 07/18/2019      Reactions   Enalapril Swelling   angioedema   Oxycodone-acetaminophen Shortness Of Breath   Ranitidine Hcl Swelling   Aspirin    Hydrochlorothiazide Rash   Penicillins Rash      Medication List    TAKE these medications   acetaminophen 500 MG tablet Commonly known as: TYLENOL Take 500-1,000 mg by mouth every 6 (six) hours as needed for mild pain or fever.   aspirin 81 MG EC tablet Take 81 mg by mouth daily.   azithromycin 250 MG tablet Commonly known as: ZITHROMAX Take 250 mg by mouth daily.   carvedilol 12.5 MG tablet Commonly known as: COREG Take 12.5 mg by mouth in the morning and at bedtime.   docusate sodium 100 MG capsule Commonly known as: COLACE Take 100 mg by mouth 2 (two) times daily.   fluticasone 50 MCG/ACT nasal spray Commonly known as: FLONASE Place 1 spray into both nostrils daily.   furosemide 40 MG tablet Commonly known as: LASIX Take 40-80 mg by mouth as directed.   hydrALAZINE 25 MG tablet Commonly known as: APRESOLINE Take  25 mg by mouth 2 (two) times daily.   omeprazole 20 MG capsule Commonly known as: PRILOSEC Take 20 mg by mouth daily.   Oyster Shell 500 MG Tabs Take 500 mg by mouth in the morning, at noon, and at bedtime.   predniSONE 10 MG tablet Commonly known as: DELTASONE Take 4 tablets (40 mg total) by mouth daily with breakfast for 2 days, THEN 2 tablets (20 mg total) daily with breakfast for 3 days, THEN 1 tablet (10 mg total) daily with breakfast for 3 days. Start taking on: Jul 19, 2019   ProAir HFA 108 (90 Base) MCG/ACT inhaler Generic drug: albuterol Inhale 2 puffs into the lungs every 4 (four) hours as needed for wheezing or shortness of breath.   simvastatin 10 MG tablet Commonly known as: ZOCOR Take 10 mg by mouth at bedtime.   spironolactone 25 MG tablet Commonly  known as: ALDACTONE Take 25 mg by mouth daily.   telmisartan 80 MG tablet Commonly known as: MICARDIS Take 80 mg by mouth daily.   Trelegy Ellipta 100-62.5-25 MCG/INH Aepb Generic drug: Fluticasone-Umeclidin-Vilant Take 1 puff by mouth daily.   Vitamin D-1000 Max St 25 MCG (1000 UT) tablet Generic drug: Cholecalciferol Take by mouth.      Allergies  Allergen Reactions  . Enalapril Swelling    angioedema  . Oxycodone-Acetaminophen Shortness Of Breath  . Ranitidine Hcl Swelling  . Aspirin   . Hydrochlorothiazide Rash  . Penicillins Rash    The results of significant diagnostics from this hospitalization (including imaging, microbiology, ancillary and laboratory) are listed below for reference.    Significant Diagnostic Studies: DG Chest 2 View  Result Date: 07/07/2019 CLINICAL DATA:  Shortness of breath, not feeling well. Minor increase in swelling of the legs. History of asthma, COPD, diabetes, hypertension. EXAM: CHEST - 2 VIEW COMPARISON:  Chest x-ray dated 03/19/2019. FINDINGS: Stable mild cardiomegaly. Lungs are clear. No pleural effusion or pneumothorax is seen. No acute appearing osseous abnormality. Chronic mild compression deformity of a lower thoracic vertebral body. IMPRESSION: 1. No acute findings. No evidence of pneumonia or pulmonary edema. 2. Stable mild cardiomegaly. Electronically Signed   By: Bary Richard M.D.   On: 07/07/2019 10:45   US Venous Img Lower Unilateral Left (DVT)  Result Date: 07/09/2019 CLINICAL DATA:  Left lower extremity pain and edema. History of smoking. Evaluate for DVT. EXAM: LEFT LOWER EXTREMITY VENOUS DOPPLER ULTRASOUND TECHNIQUE: Gray-scale sonography with graded compression, as well as color Doppler and duplex ultrasound were performed to evaluate the lower extremity deep venous systems from the level of the common femoral vein and including the common femoral, femoral, profunda femoral, popliteal and calf veins including the posterior  tibial, peroneal and gastrocnemius veins when visible. The superficial great saphenous vein was also interrogated. Spectral Doppler was utilized to evaluate flow at rest and with distal augmentation maneuvers in the common femoral, femoral and popliteal veins. COMPARISON:  None. FINDINGS: Examination is degraded due to patient body habitus and poor sonographic window. Contralateral Common Femoral Vein: Respiratory phasicity is normal and symmetric with the symptomatic side. No evidence of thrombus. Normal compressibility. Common Femoral Vein: No evidence of thrombus. Normal compressibility, respiratory phasicity and response to augmentation. Saphenofemoral Junction: No evidence of thrombus. Normal compressibility and flow on color Doppler imaging. Profunda Femoral Vein: No evidence of thrombus. Normal compressibility and flow on color Doppler imaging. Femoral Vein: No evidence of thrombus. Normal compressibility, respiratory phasicity and response to augmentation. Popliteal Vein: No evidence of thrombus. Normal  compressibility, respiratory phasicity and response to augmentation. Calf Veins: No evidence of thrombus. Normal compressibility and flow on color Doppler imaging. Superficial Great Saphenous Vein: No evidence of thrombus. The vessel appears prominent proximally (representative images 14, 15, 17 and 18). Normal compressibility. Venous Reflux:  None. Other Findings:  None. IMPRESSION: No evidence of DVT within the left lower extremity. Electronically Signed   By: Sandi Mariscal M.D.   On: 07/09/2019 09:23   Labs: Basic Metabolic Panel: Recent Labs  Lab 07/12/19 0549 07/13/19 0408  NA 132* 131*  K 4.8 4.3  CL 91* 89*  CO2 34* 36*  GLUCOSE 125* 116*  BUN 34* 36*  CREATININE 0.92 1.12*  CALCIUM 9.6 8.9  MG 1.9  --     Recent Labs    03/19/19 1206 07/07/19 0942  BNP 105.0* 129.0*    Principal Problem:   COPD exacerbation (HCC) Active Problems:   Chronic respiratory failure (HCC)   Acid  reflux   Essential (primary) hypertension   HLD (hyperlipidemia)   CKD (chronic kidney disease), stage IIIa   Tobacco abuse   Generalized weakness   Hyponatremia   Hyperkalemia   Normocytic anemia   Acute on chronic respiratory failure with hypoxia (HCC)   Morbid obesity with BMI of 50.0-59.9, adult (HCC)   OSA (obstructive sleep apnea)   Obesity hypoventilation syndrome ([place])   Time coordinating discharge: 35 minutes  Signed:  Murray Hodgkins, MD  [facility]  07/18/2019, 12:07 PM

## 2019-07-18 NOTE — Care Management Important Message (Signed)
Important Message  Patient Details  Name: Kelly Hogan MRN: [id number] Date of Birth: 1941-02-20   Medicare Important Message Given:  Yes     [name] [name] 07/18/2019, 12:01 PM

## 2019-07-18 NOTE — Progress Notes (Signed)
Pt discharged with all her belongings, discharge instructions in hand having gone over them with her, brother at front to pick her up and happy with her care, per her commments. Vitals stable, DC'd on 3L [place].

## 2019-08-04 ENCOUNTER — Ambulatory Visit: Payer: Medicare Other | Admitting: Podiatry

## 2019-08-18 ENCOUNTER — Ambulatory Visit: Payer: Medicare Other | Admitting: Podiatry

## 2019-10-14 DIAGNOSIS — J811 Chronic pulmonary edema: Secondary | ICD-10-CM | POA: Insufficient documentation

## 2019-10-14 DIAGNOSIS — N179 Acute kidney failure, unspecified: Secondary | ICD-10-CM | POA: Insufficient documentation

## 2019-10-18 DIAGNOSIS — E8770 Fluid overload, unspecified: Secondary | ICD-10-CM | POA: Insufficient documentation

## 2019-10-20 ENCOUNTER — Ambulatory Visit: Payer: Medicare Other | Admitting: Podiatry

## 2019-12-01 ENCOUNTER — Ambulatory Visit (INDEPENDENT_AMBULATORY_CARE_PROVIDER_SITE_OTHER): Payer: Medicare Other | Admitting: Podiatry

## 2019-12-01 ENCOUNTER — Encounter: Payer: Self-pay | Admitting: Podiatry

## 2019-12-01 ENCOUNTER — Other Ambulatory Visit: Payer: Self-pay

## 2019-12-01 DIAGNOSIS — B351 Tinea unguium: Secondary | ICD-10-CM | POA: Diagnosis not present

## 2019-12-01 DIAGNOSIS — E119 Type 2 diabetes mellitus without complications: Secondary | ICD-10-CM | POA: Diagnosis not present

## 2019-12-01 DIAGNOSIS — M79676 Pain in unspecified toe(s): Secondary | ICD-10-CM

## 2019-12-01 DIAGNOSIS — I872 Venous insufficiency (chronic) (peripheral): Secondary | ICD-10-CM

## 2019-12-01 NOTE — Progress Notes (Signed)
This patient returns to my office for at risk foot care.  This patient requires this care by a professional since this patient will be at risk due to having  Diabetes and venous insufficiency.   This patient is unable to cut nails herself  since the patient cannot reach their nails.These nails are painful walking and wearing shoes.  This patient presents for at risk foot care today. She presents to the office with her niece. Patient is wearing unna boots both feet.  General Appearance  Alert, conversant and in no acute stress.  Vascular  deferred  Neurologic  deferred  Nails Thick disfigured discolored nails with subungual debris  from hallux to fifth toes bilaterally. No evidence of bacterial infection or drainage bilaterally.  Orthopedic  No limitations of motion  feet .  No crepitus or effusions noted.  No bony pathology or digital deformities noted.  Skin  normotropic skin with no porokeratosis noted bilaterally.  No signs of infections or ulcers noted.   Clavi third toe right foot.  Onychomycosis  Pain in right toes  Pain in left toes  Consent was obtained for treatment procedures.   Mechanical debridement of nails 1-5  bilaterally performed with a nail nipper.  Filed with dremel without incident. No infection or ulcer.     Return office visit   10 weeks       Told patient to return for periodic foot care and evaluation due to potential at risk complications.   Helane Gunther DPM

## 2019-12-21 DIAGNOSIS — I5030 Unspecified diastolic (congestive) heart failure: Secondary | ICD-10-CM | POA: Insufficient documentation

## 2020-02-09 DIAGNOSIS — I5033 Acute on chronic diastolic (congestive) heart failure: Secondary | ICD-10-CM | POA: Insufficient documentation

## 2020-02-16 ENCOUNTER — Ambulatory Visit: Payer: Medicare Other | Admitting: Podiatry

## 2020-05-14 ENCOUNTER — Other Ambulatory Visit: Payer: Self-pay

## 2020-05-14 ENCOUNTER — Ambulatory Visit (INDEPENDENT_AMBULATORY_CARE_PROVIDER_SITE_OTHER): Payer: Medicare Other | Admitting: Podiatry

## 2020-05-14 ENCOUNTER — Encounter: Payer: Self-pay | Admitting: Podiatry

## 2020-05-14 DIAGNOSIS — M79676 Pain in unspecified toe(s): Secondary | ICD-10-CM

## 2020-05-14 DIAGNOSIS — E119 Type 2 diabetes mellitus without complications: Secondary | ICD-10-CM | POA: Diagnosis not present

## 2020-05-14 DIAGNOSIS — B351 Tinea unguium: Secondary | ICD-10-CM | POA: Diagnosis not present

## 2020-05-14 DIAGNOSIS — I872 Venous insufficiency (chronic) (peripheral): Secondary | ICD-10-CM | POA: Diagnosis not present

## 2020-05-14 NOTE — Progress Notes (Signed)
This patient returns to my office for at risk foot care.  This patient requires this care by a professional since this patient will be at risk due to having  Diabetes and venous insufficiency.   This patient is unable to cut nails herself  since the patient cannot reach her nails.These nails are painful walking and wearing shoes.  This patient presents for at risk foot care today. She presents to the office with her son..  General Appearance  Alert, conversant and in no acute stress.  Vascular Dorsalis pedis are palpable  B/L.  Posterior tibial pulses are absent due to swelling.  Capillary return  WNL.  Absent hair  B/L.  Neurologic LOPS  Diminished  B/L.  Nails Thick disfigured discolored nails with subungual debris  from hallux to fifth toes bilaterally. No evidence of bacterial infection or drainage bilaterally.  Orthopedic  No limitations of motion  feet .  No crepitus or effusions noted.  No bony pathology or digital deformities noted.  Skin  normotropic skin with no porokeratosis noted bilaterally.  No signs of infections or ulcers noted.     Onychomycosis  Pain in right toes  Pain in left toes  Consent was obtained for treatment procedures.   Mechanical debridement of nails 1-5  bilaterally performed with a nail nipper.  Filed with dremel without incident. No infection or ulcer.     Return office visit   10 weeks       Told patient to return for periodic foot care and evaluation due to potential at risk complications.   Helane Gunther DPM

## 2020-08-16 ENCOUNTER — Ambulatory Visit (INDEPENDENT_AMBULATORY_CARE_PROVIDER_SITE_OTHER): Payer: Medicare Other | Admitting: Podiatry

## 2020-08-16 ENCOUNTER — Encounter: Payer: Self-pay | Admitting: Podiatry

## 2020-08-16 ENCOUNTER — Other Ambulatory Visit: Payer: Self-pay

## 2020-08-16 DIAGNOSIS — M79676 Pain in unspecified toe(s): Secondary | ICD-10-CM | POA: Diagnosis not present

## 2020-08-16 DIAGNOSIS — N1831 Chronic kidney disease, stage 3a: Secondary | ICD-10-CM

## 2020-08-16 DIAGNOSIS — I872 Venous insufficiency (chronic) (peripheral): Secondary | ICD-10-CM | POA: Diagnosis not present

## 2020-08-16 DIAGNOSIS — B351 Tinea unguium: Secondary | ICD-10-CM

## 2020-08-16 DIAGNOSIS — E119 Type 2 diabetes mellitus without complications: Secondary | ICD-10-CM

## 2020-08-16 NOTE — Progress Notes (Signed)
This patient returns to my office for at risk foot care.  This patient requires this care by a professional since this patient will be at risk due to having  CKD,  Diabetes and venous insufficiency.   This patient is unable to cut nails herself  since the patient cannot reach her nails.These nails are painful walking and wearing shoes.  This patient presents for at risk foot care today. She presents to the office with her son..  General Appearance  Alert, conversant and in no acute stress.  Vascular Dorsalis pedis are palpable  B/L.  Posterior tibial pulses are absent due to swelling.  Capillary return  WNL.  Absent hair  B/L.   Neurologic LOPS  Diminished  B/L.  Nails Thick disfigured discolored nails with subungual debris  from hallux to fifth toes bilaterally. No evidence of bacterial infection or drainage bilaterally.  Orthopedic  No limitations of motion  feet .  No crepitus or effusions noted.  No bony pathology or digital deformities noted.  Skin  normotropic skin with no porokeratosis noted bilaterally.  No signs of infections or ulcers noted.     Onychomycosis  Pain in right toes  Pain in left toes  Consent was obtained for treatment procedures.   Mechanical debridement of nails 1-5  bilaterally performed with a nail nipper.  Filed with dremel without incident. No infection or ulcer.     Return office visit   10 weeks       Told patient to return for periodic foot care and evaluation due to potential at risk complications.   Shauntell Iglesia DPM  

## 2020-09-14 IMAGING — US US EXTREM LOW VENOUS*L*
1 series · 13 of 24 positions shown · non-contrast
Comparison: None.

CLINICAL DATA: Pain and swelling for 2 months



[Series 1: us extrem low venous*left* · 0.09mm/px · 13 of 34 slices shown]
[im 1/34]
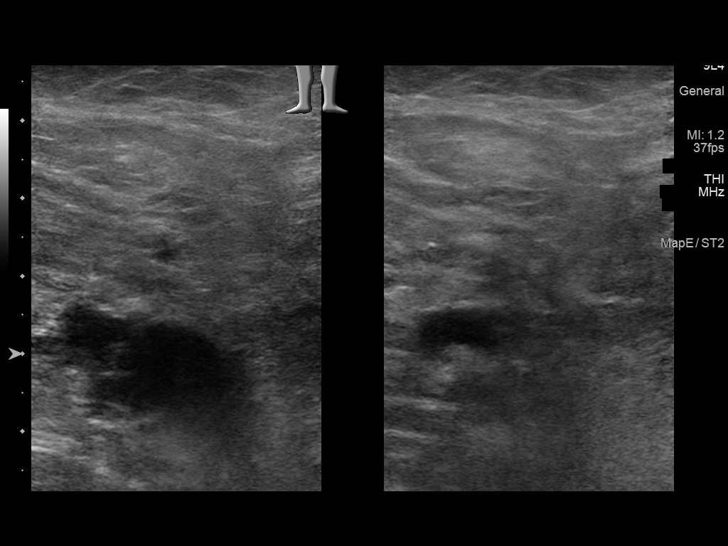
[im 3/34]
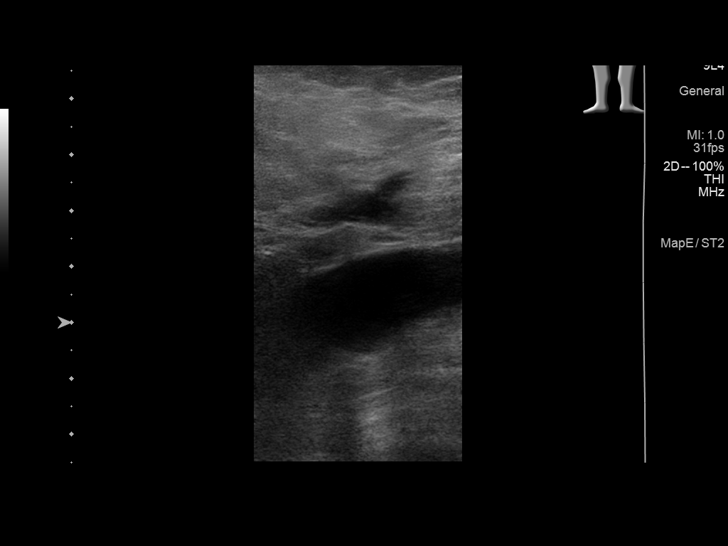
[im 6/34]
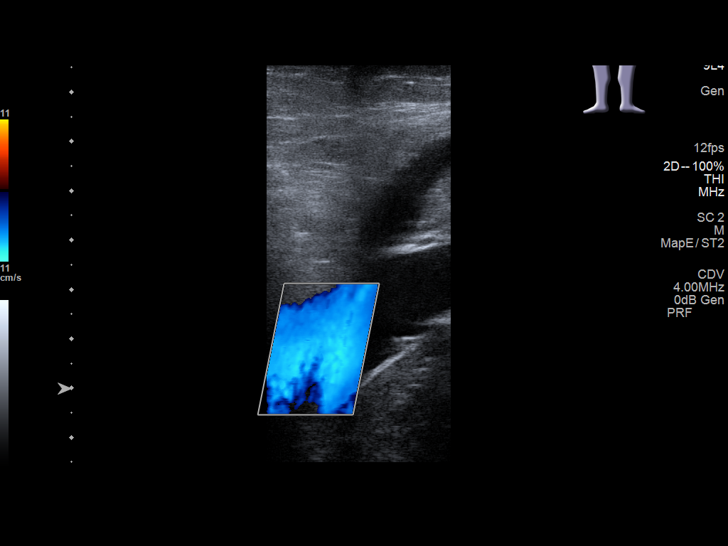
[im 9/34]
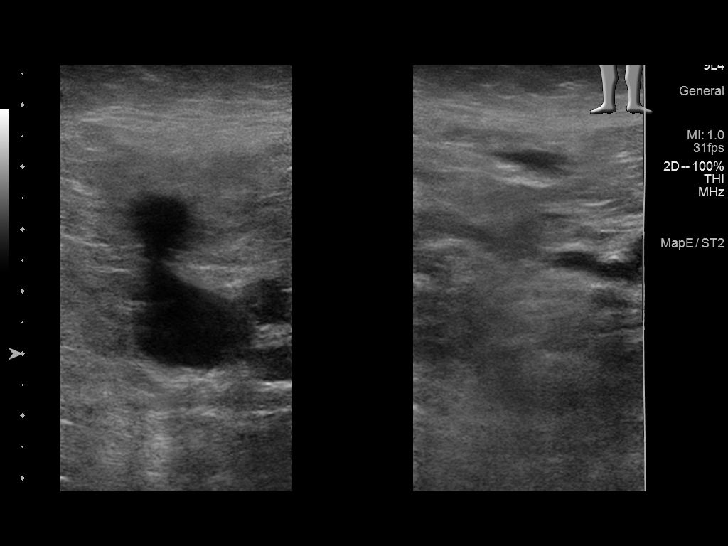
[im 12/34]
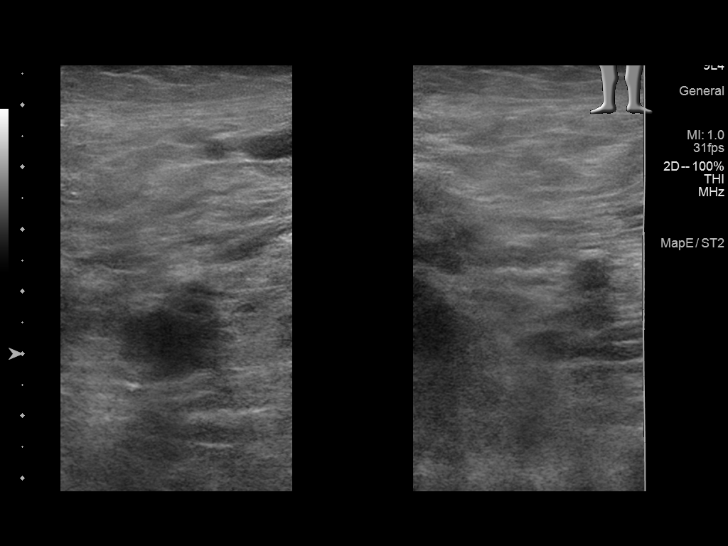
[im 15/34]
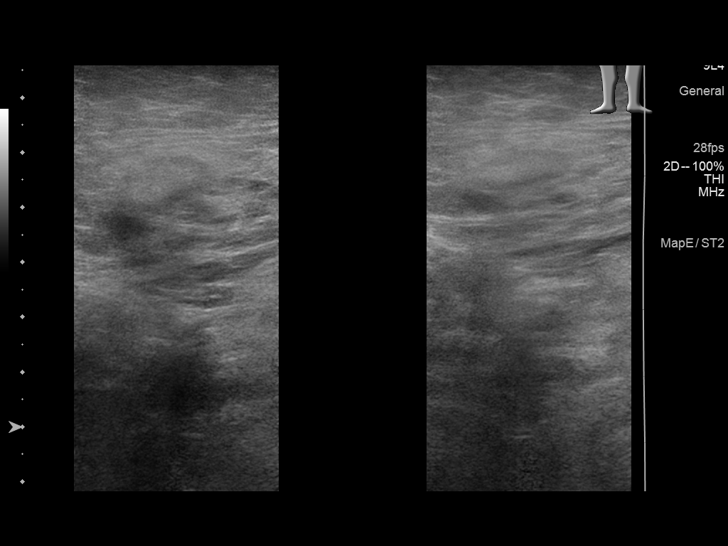
[im 18/34]
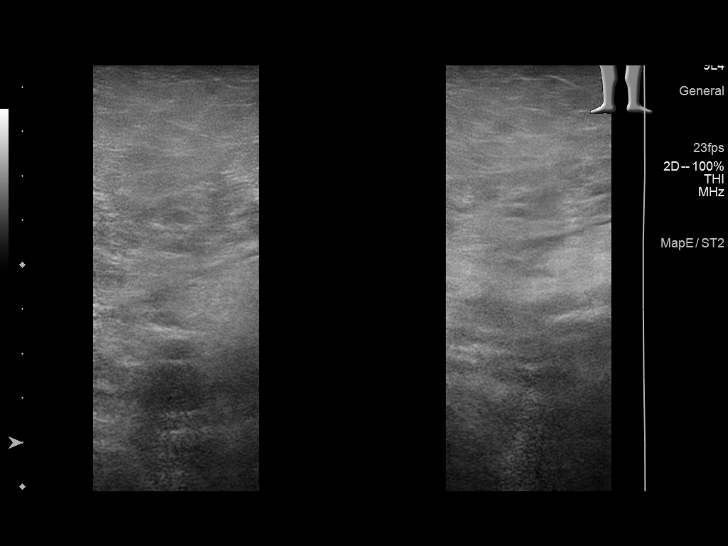
[im 19/34]
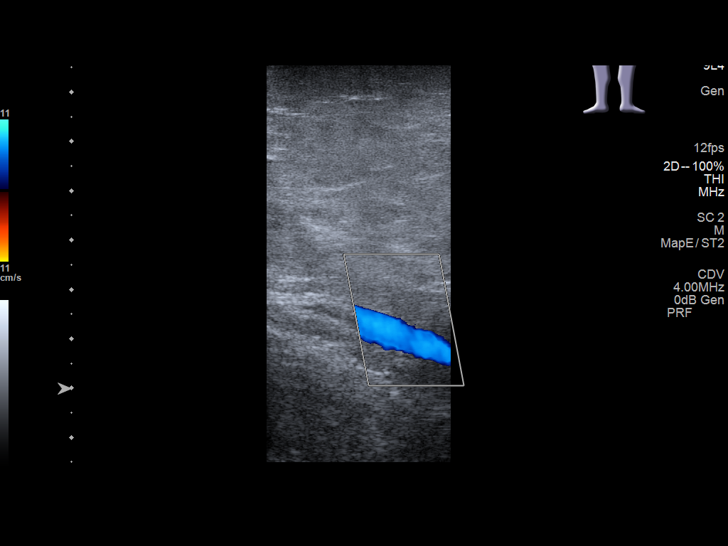
[im 22/34]
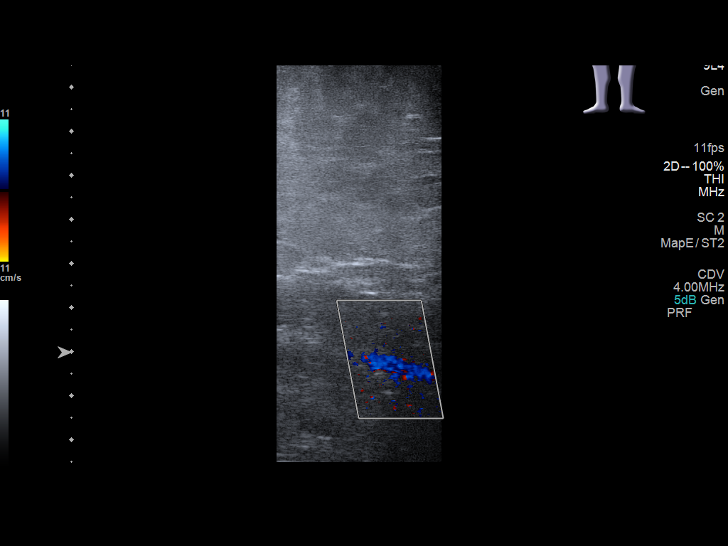
[im 25/34]
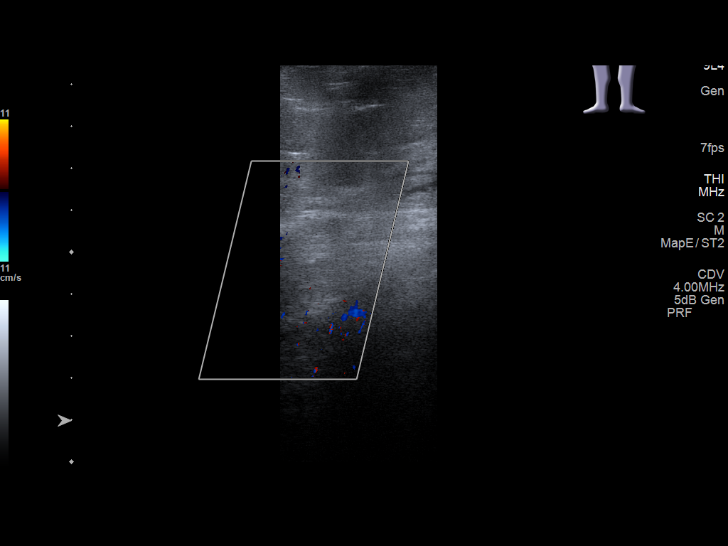
[im 28/34]
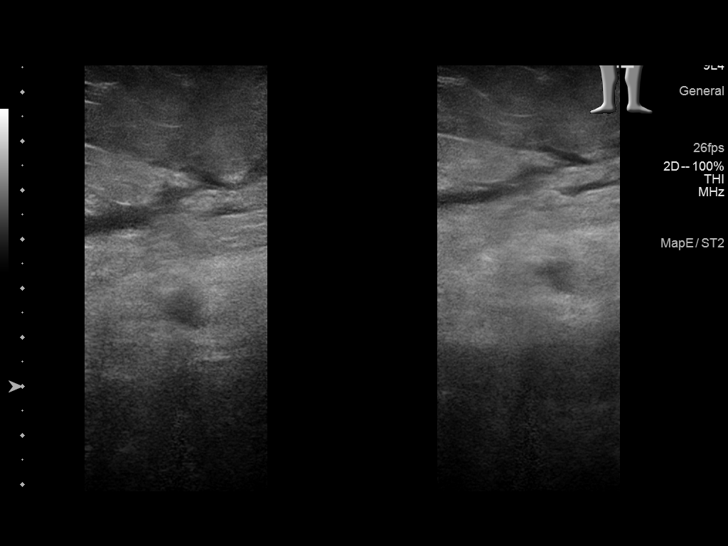
[im 31/34]
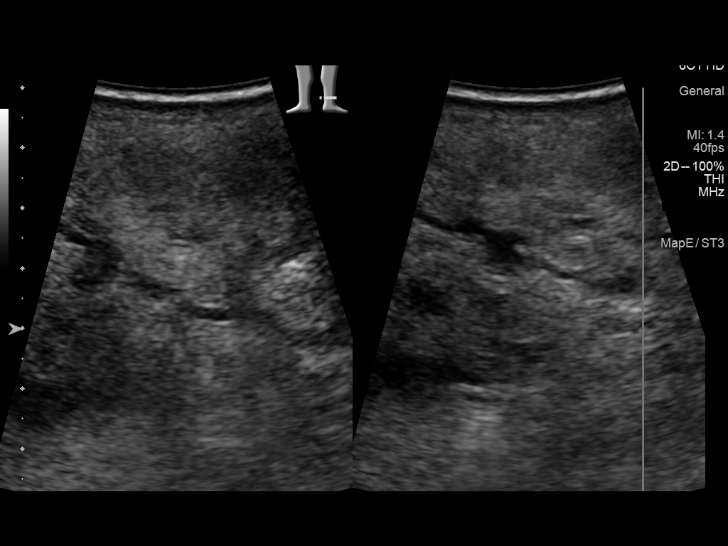
[im 34/34]
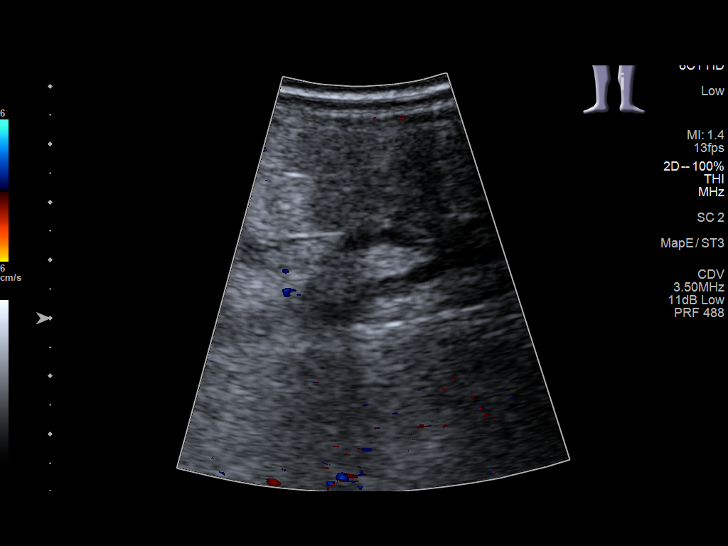

[13 of 24 positions shown; findings below may reference images not displayed]

FINDINGS: Contralateral Common Femoral Vein: Respiratory phasicity is normal
and symmetric with the symptomatic side. No evidence of thrombus.
Normal compressibility.

Common Femoral Vein: No evidence of thrombus. Normal
compressibility, respiratory phasicity and response to augmentation.

Saphenofemoral Junction: No evidence of thrombus. Normal
compressibility and flow on color Doppler imaging.

Profunda Femoral Vein: No evidence of thrombus. Normal
compressibility and flow on color Doppler imaging.

Femoral Vein: No evidence of thrombus. Normal compressibility,
respiratory phasicity and response to augmentation.

Popliteal Vein: No evidence of thrombus. Normal compressibility,
respiratory phasicity and response to augmentation.

Calf Veins: Calf veins not visualized. Limited because of peripheral
edema and body habitus.
IMPRESSION: No significant femoropopliteal DVT demonstrated. Calf veins not
visualized.

## 2020-10-25 ENCOUNTER — Ambulatory Visit: Payer: Medicare Other | Admitting: Podiatry

## 2020-11-29 ENCOUNTER — Encounter (INDEPENDENT_AMBULATORY_CARE_PROVIDER_SITE_OTHER): Payer: Self-pay

## 2020-11-29 ENCOUNTER — Ambulatory Visit (INDEPENDENT_AMBULATORY_CARE_PROVIDER_SITE_OTHER): Payer: Medicare Other | Admitting: Podiatry

## 2020-11-29 ENCOUNTER — Other Ambulatory Visit: Payer: Self-pay

## 2020-11-29 ENCOUNTER — Encounter: Payer: Self-pay | Admitting: Podiatry

## 2020-11-29 DIAGNOSIS — N1831 Chronic kidney disease, stage 3a: Secondary | ICD-10-CM | POA: Diagnosis not present

## 2020-11-29 DIAGNOSIS — M79676 Pain in unspecified toe(s): Secondary | ICD-10-CM

## 2020-11-29 DIAGNOSIS — B351 Tinea unguium: Secondary | ICD-10-CM

## 2020-11-29 DIAGNOSIS — E119 Type 2 diabetes mellitus without complications: Secondary | ICD-10-CM

## 2020-11-29 DIAGNOSIS — I872 Venous insufficiency (chronic) (peripheral): Secondary | ICD-10-CM | POA: Diagnosis not present

## 2020-11-29 NOTE — Progress Notes (Signed)
This patient returns to my office for at risk foot care.  This patient requires this care by a professional since this patient will be at risk due to having  CKD,  Diabetes and venous insufficiency.   This patient is unable to cut nails herself  since the patient cannot reach her nails.These nails are painful walking and wearing shoes.  This patient presents for at risk foot care today. She presents to the office with her son..  General Appearance  Alert, conversant and in no acute stress.  Vascular Dorsalis pedis are palpable  B/L.  Posterior tibial pulses are absent due to swelling.  Capillary return  WNL.  Absent hair  B/L.   Neurologic LOPS  Diminished  B/L.  Nails Thick disfigured discolored nails with subungual debris  from hallux to fifth toes bilaterally. No evidence of bacterial infection or drainage bilaterally.  Orthopedic  No limitations of motion  feet .  No crepitus or effusions noted.  No bony pathology or digital deformities noted.  Skin  normotropic skin with no porokeratosis noted bilaterally.  No signs of infections or ulcers noted.     Onychomycosis  Pain in right toes  Pain in left toes  Consent was obtained for treatment procedures.   Mechanical debridement of nails 1-5  bilaterally performed with a nail nipper.  Filed with dremel without incident. No infection or ulcer.     Return office visit   10 weeks       Told patient to return for periodic foot care and evaluation due to potential at risk complications.   Harvel Meskill DPM  

## 2021-01-04 IMAGING — US US EXTREM LOW VENOUS*L*
1 series · 13 of 24 positions shown · non-contrast
Comparison: None.

CLINICAL DATA: Left lower extremity pain and edema. History of
smoking. Evaluate for DVT.



[Series 1: us venous img lower uni left (dvt) · portal-venous · 13 of 35 slices shown]
[im 1/35]
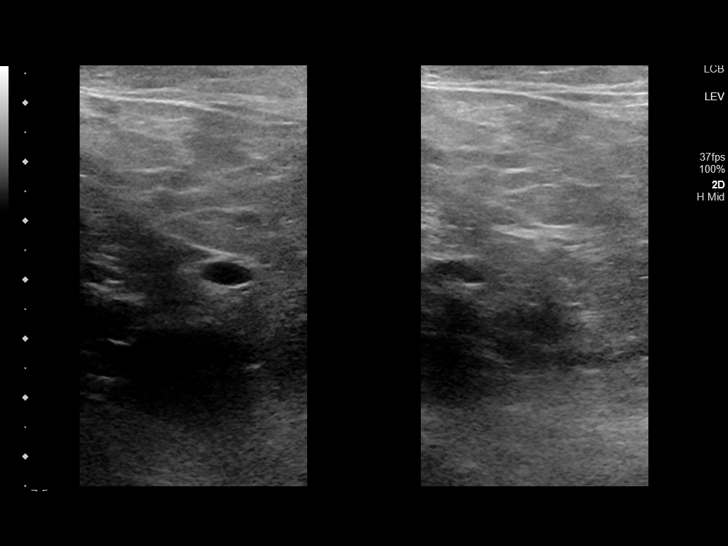
[im 3/35]
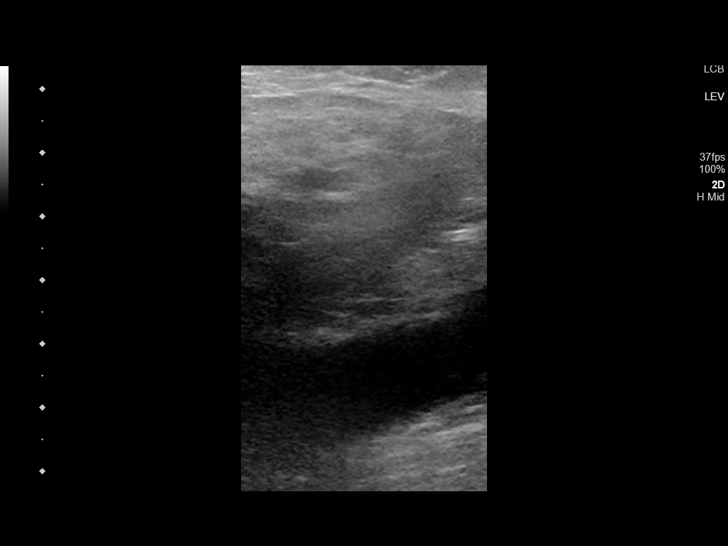
[im 6/35]
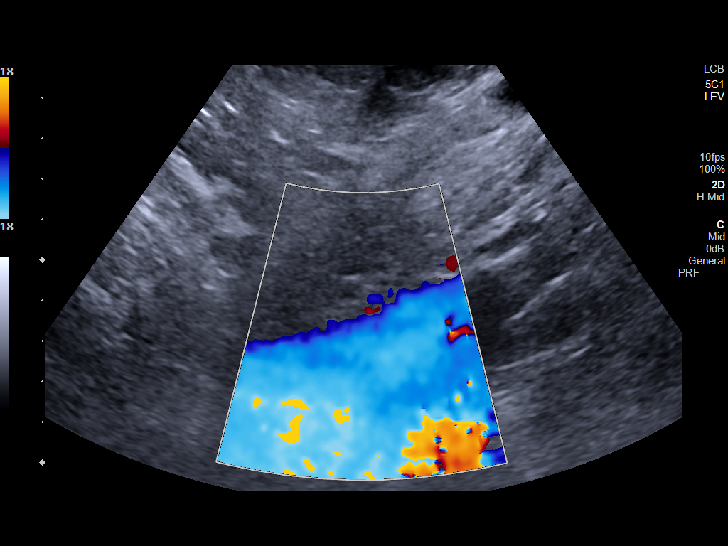
[im 9/35]
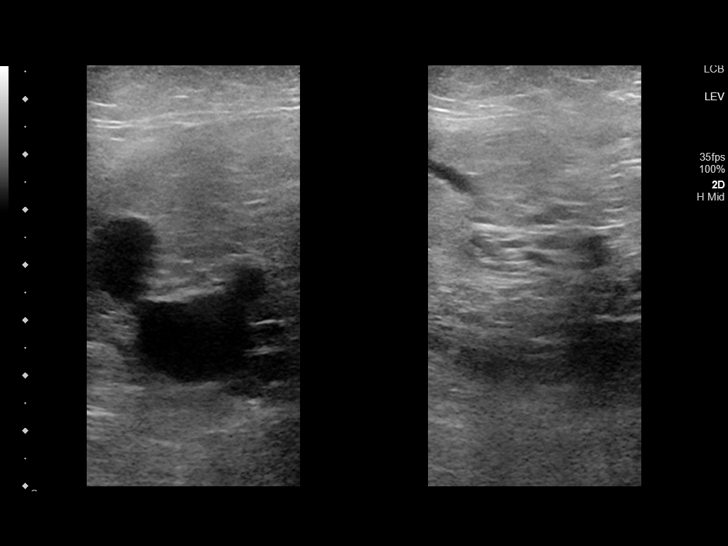
[im 12/35]
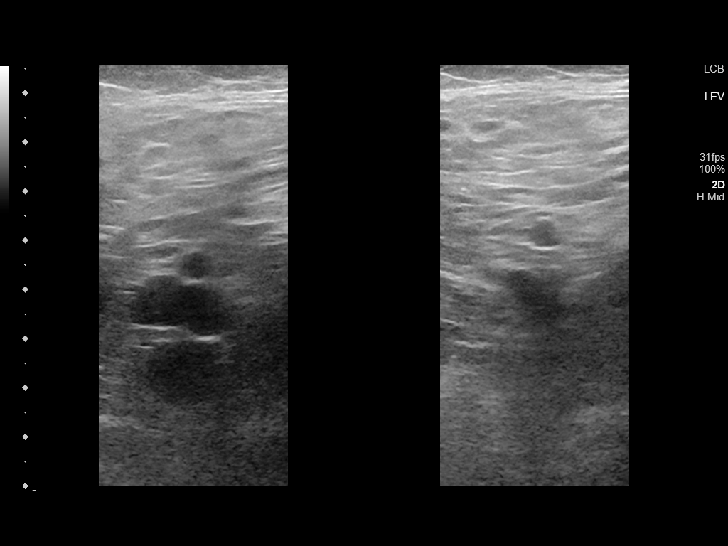
[im 15/35]
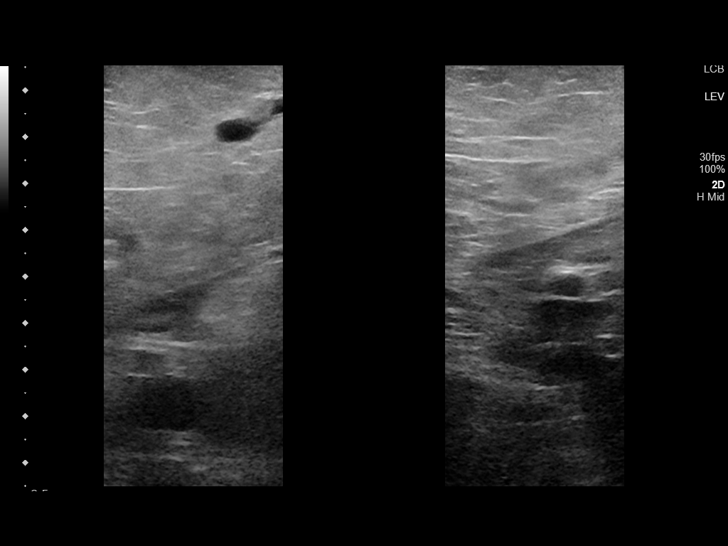
[im 18/35]
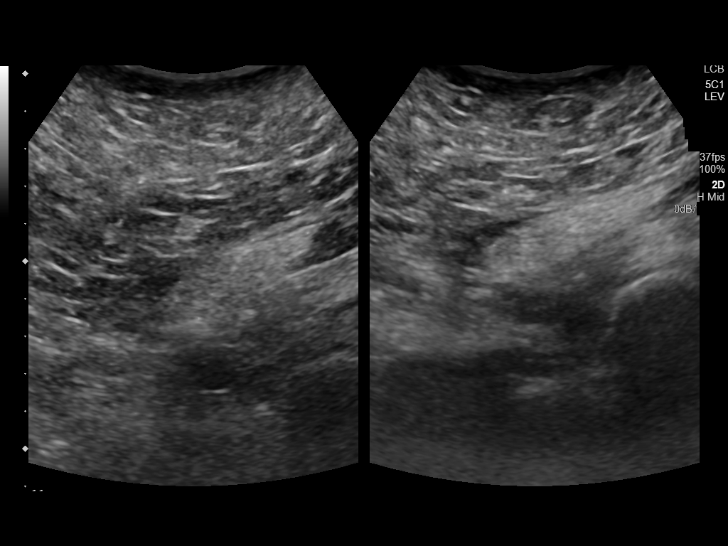
[im 20/35]
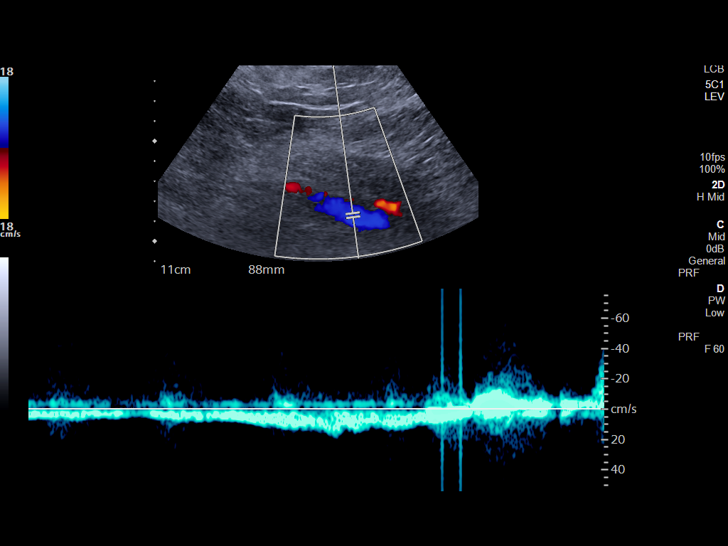
[im 23/35]
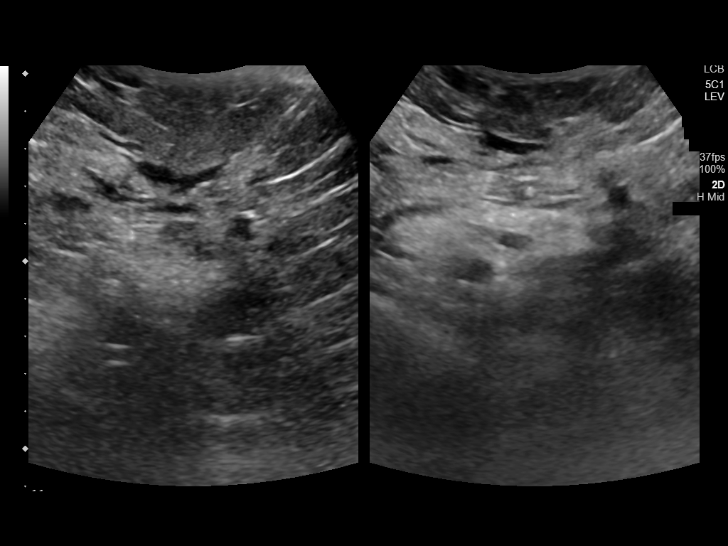
[im 26/35]
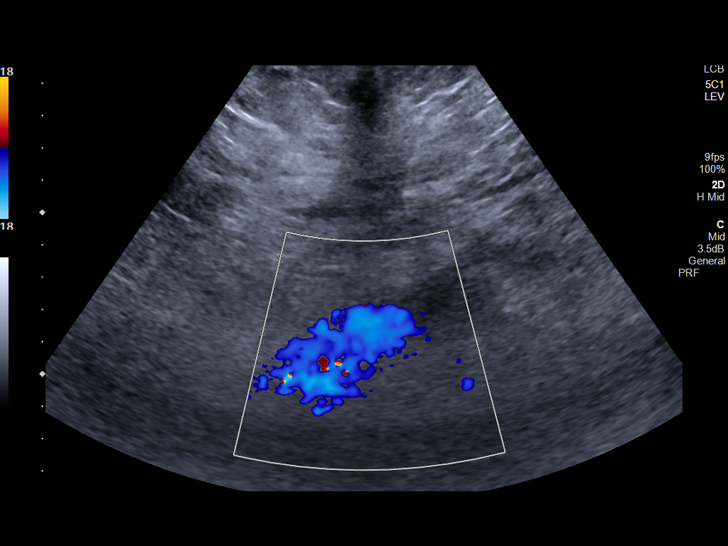
[im 29/35]
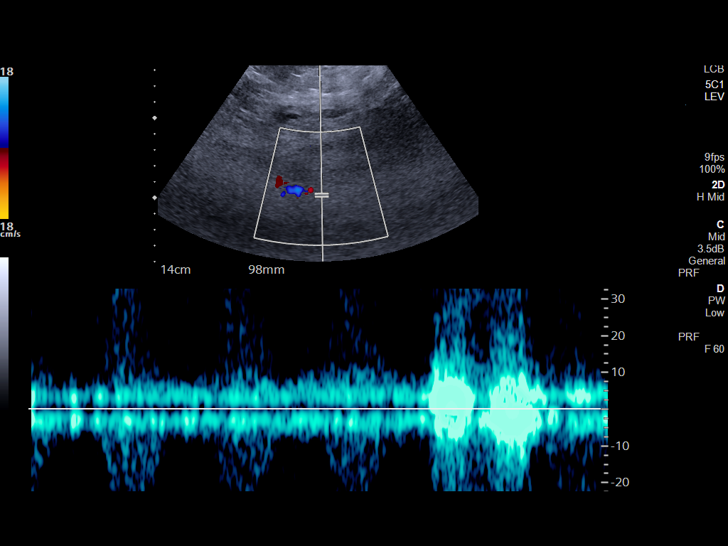
[im 32/35]
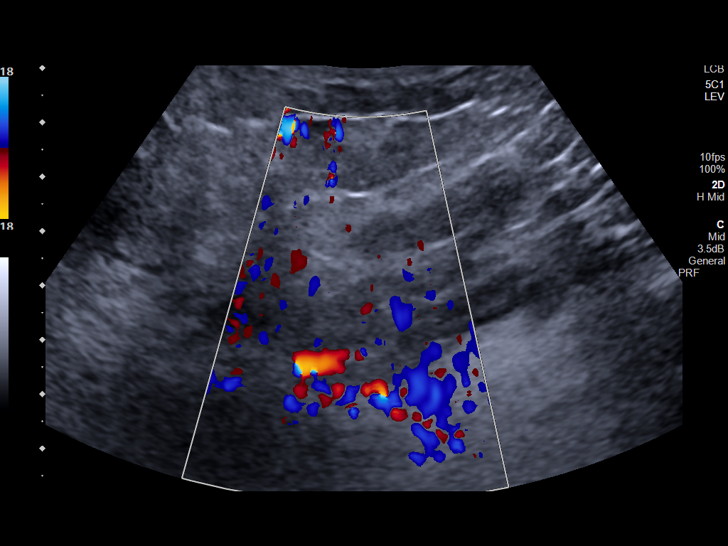
[im 35/35]
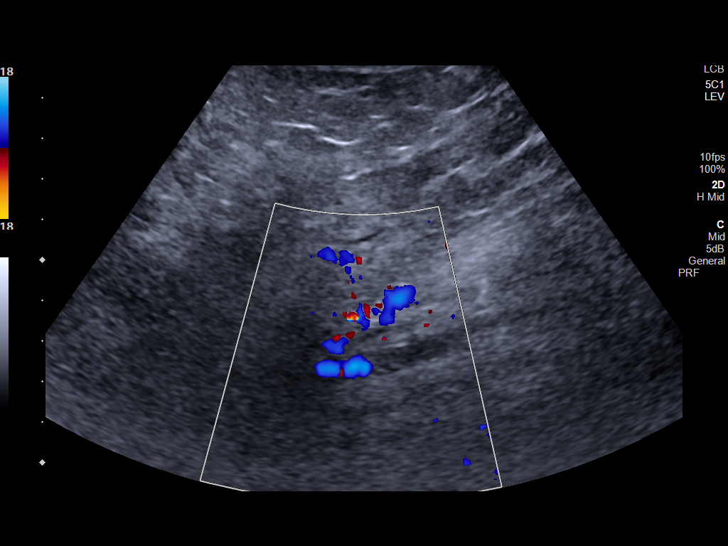

[13 of 24 positions shown; findings below may reference images not displayed]

FINDINGS: Examination is degraded due to patient body habitus and poor
sonographic window.

Contralateral Common Femoral Vein: Respiratory phasicity is normal
and symmetric with the symptomatic side. No evidence of thrombus.
Normal compressibility.

Common Femoral Vein: No evidence of thrombus. Normal
compressibility, respiratory phasicity and response to augmentation.

Saphenofemoral Junction: No evidence of thrombus. Normal
compressibility and flow on color Doppler imaging.

Profunda Femoral Vein: No evidence of thrombus. Normal
compressibility and flow on color Doppler imaging.

Femoral Vein: No evidence of thrombus. Normal compressibility,
respiratory phasicity and response to augmentation.

Popliteal Vein: No evidence of thrombus. Normal compressibility,
respiratory phasicity and response to augmentation.

Calf Veins: No evidence of thrombus. Normal compressibility and flow
on color Doppler imaging.

Superficial Great Saphenous Vein: No evidence of thrombus. The
vessel appears prominent proximally (representative images 14, 15,
17 and 18). Normal compressibility.

Venous Reflux:  None.

Other Findings:  None.
IMPRESSION: No evidence of DVT within the left lower extremity.

## 2021-02-14 ENCOUNTER — Ambulatory Visit (INDEPENDENT_AMBULATORY_CARE_PROVIDER_SITE_OTHER): Payer: Medicare Other | Admitting: Podiatry

## 2021-02-14 ENCOUNTER — Encounter: Payer: Self-pay | Admitting: Podiatry

## 2021-02-14 ENCOUNTER — Other Ambulatory Visit: Payer: Self-pay

## 2021-02-14 DIAGNOSIS — M79676 Pain in unspecified toe(s): Secondary | ICD-10-CM | POA: Diagnosis not present

## 2021-02-14 DIAGNOSIS — E119 Type 2 diabetes mellitus without complications: Secondary | ICD-10-CM

## 2021-02-14 DIAGNOSIS — N1831 Chronic kidney disease, stage 3a: Secondary | ICD-10-CM

## 2021-02-14 DIAGNOSIS — B351 Tinea unguium: Secondary | ICD-10-CM | POA: Diagnosis not present

## 2021-02-14 DIAGNOSIS — I872 Venous insufficiency (chronic) (peripheral): Secondary | ICD-10-CM

## 2021-02-14 NOTE — Progress Notes (Signed)
This patient returns to my office for at risk foot care.  This patient requires this care by a professional since this patient will be at risk due to having  CKD,  Diabetes and venous insufficiency.   This patient is unable to cut nails herself  since the patient cannot reach her nails.These nails are painful walking and wearing shoes.  This patient presents for at risk foot care today. She presents to the office with her son..  General Appearance  Alert, conversant and in no acute stress.  Vascular Dorsalis pedis are palpable  B/L.  Posterior tibial pulses are absent due to swelling.  Capillary return  WNL.  Absent hair  B/L.   Neurologic LOPS  Diminished  B/L.  Nails Thick disfigured discolored nails with subungual debris  from hallux to fifth toes bilaterally. No evidence of bacterial infection or drainage bilaterally.  Orthopedic  No limitations of motion  feet .  No crepitus or effusions noted.  No bony pathology or digital deformities noted.  Skin  normotropic skin with no porokeratosis noted bilaterally.  No signs of infections or ulcers noted.     Onychomycosis  Pain in right toes  Pain in left toes  Consent was obtained for treatment procedures.   Mechanical debridement of nails 1-5  bilaterally performed with a nail nipper.  Filed with dremel without incident. No infection or ulcer.     Return office visit   10 weeks       Told patient to return for periodic foot care and evaluation due to potential at risk complications.   Jomayra Novitsky DPM  

## 2021-05-02 ENCOUNTER — Ambulatory Visit: Payer: Medicare Other | Admitting: Podiatry

## 2021-06-06 ENCOUNTER — Encounter: Payer: Self-pay | Admitting: Podiatry

## 2021-06-06 ENCOUNTER — Other Ambulatory Visit: Payer: Self-pay

## 2021-06-06 ENCOUNTER — Ambulatory Visit (INDEPENDENT_AMBULATORY_CARE_PROVIDER_SITE_OTHER): Payer: Medicare Other | Admitting: Podiatry

## 2021-06-06 DIAGNOSIS — I872 Venous insufficiency (chronic) (peripheral): Secondary | ICD-10-CM

## 2021-06-06 DIAGNOSIS — B351 Tinea unguium: Secondary | ICD-10-CM | POA: Diagnosis not present

## 2021-06-06 DIAGNOSIS — M79676 Pain in unspecified toe(s): Secondary | ICD-10-CM | POA: Diagnosis not present

## 2021-06-06 DIAGNOSIS — E119 Type 2 diabetes mellitus without complications: Secondary | ICD-10-CM

## 2021-06-06 DIAGNOSIS — N1831 Chronic kidney disease, stage 3a: Secondary | ICD-10-CM

## 2021-06-06 NOTE — Progress Notes (Signed)
This patient returns to my office for at risk foot care.  This patient requires this care by a professional since this patient will be at risk due to having  CKD,  Diabetes and venous insufficiency.   This patient is unable to cut nails herself  since the patient cannot reach her nails.These nails are painful walking and wearing shoes.  This patient presents for at risk foot care today. She presents to the office with her son..  General Appearance  Alert, conversant and in no acute stress.  Vascular Dorsalis pedis are palpable  B/L.  Posterior tibial pulses are absent due to swelling.  Capillary return  WNL.  Absent hair  B/L.   Neurologic LOPS  Diminished  B/L.  Nails Thick disfigured discolored nails with subungual debris  from hallux to fifth toes bilaterally. No evidence of bacterial infection or drainage bilaterally.  Orthopedic  No limitations of motion  feet .  No crepitus or effusions noted.  No bony pathology or digital deformities noted.  Skin  normotropic skin with no porokeratosis noted bilaterally.  No signs of infections or ulcers noted.     Onychomycosis  Pain in right toes  Pain in left toes  Consent was obtained for treatment procedures.   Mechanical debridement of nails 1-5  bilaterally performed with a nail nipper.  Filed with dremel without incident. No infection or ulcer.     Return office visit   10 weeks       Told patient to return for periodic foot care and evaluation due to potential at risk complications.   Clotilde Loth DPM  

## 2021-09-12 ENCOUNTER — Ambulatory Visit (INDEPENDENT_AMBULATORY_CARE_PROVIDER_SITE_OTHER): Payer: Medicare Other | Admitting: Podiatry

## 2021-09-12 ENCOUNTER — Encounter: Payer: Self-pay | Admitting: Podiatry

## 2021-09-12 DIAGNOSIS — M79676 Pain in unspecified toe(s): Secondary | ICD-10-CM

## 2021-09-12 DIAGNOSIS — N1831 Chronic kidney disease, stage 3a: Secondary | ICD-10-CM

## 2021-09-12 DIAGNOSIS — I872 Venous insufficiency (chronic) (peripheral): Secondary | ICD-10-CM | POA: Diagnosis not present

## 2021-09-12 DIAGNOSIS — E119 Type 2 diabetes mellitus without complications: Secondary | ICD-10-CM

## 2021-09-12 DIAGNOSIS — B351 Tinea unguium: Secondary | ICD-10-CM

## 2021-09-12 NOTE — Progress Notes (Signed)
This patient returns to my office for at risk foot care.  This patient requires this care by a professional since this patient will be at risk due to having  CKD,  Diabetes and venous insufficiency.   This patient is unable to cut nails herself  since the patient cannot reach her nails.These nails are painful walking and wearing shoes.  This patient presents for at risk foot care today. She presents to the office with her son..  General Appearance  Alert, conversant and in no acute stress.  Vascular Dorsalis pedis are palpable  B/L.  Posterior tibial pulses are absent due to swelling.  Capillary return  WNL.  Absent hair  B/L.   Neurologic LOPS  Diminished  B/L.  Nails Thick disfigured discolored nails with subungual debris  from hallux to fifth toes bilaterally. No evidence of bacterial infection or drainage bilaterally.  Orthopedic  No limitations of motion  feet .  No crepitus or effusions noted.  No bony pathology or digital deformities noted.  Skin  normotropic skin with no porokeratosis noted bilaterally.  No signs of infections or ulcers noted.     Onychomycosis  Pain in right toes  Pain in left toes  Consent was obtained for treatment procedures.   Mechanical debridement of nails 1-5  bilaterally performed with a nail nipper.  Filed with dremel without incident. No infection or ulcer.     Return office visit   10 weeks       Told patient to return for periodic foot care and evaluation due to potential at risk complications.   Helane Gunther DPM

## 2021-11-25 ENCOUNTER — Encounter: Payer: Self-pay | Admitting: Podiatry

## 2021-11-25 ENCOUNTER — Ambulatory Visit (INDEPENDENT_AMBULATORY_CARE_PROVIDER_SITE_OTHER): Payer: Medicare Other | Admitting: Podiatry

## 2021-11-25 DIAGNOSIS — M79676 Pain in unspecified toe(s): Secondary | ICD-10-CM

## 2021-11-25 DIAGNOSIS — I872 Venous insufficiency (chronic) (peripheral): Secondary | ICD-10-CM

## 2021-11-25 DIAGNOSIS — B351 Tinea unguium: Secondary | ICD-10-CM | POA: Diagnosis not present

## 2021-11-25 DIAGNOSIS — E119 Type 2 diabetes mellitus without complications: Secondary | ICD-10-CM | POA: Diagnosis not present

## 2021-11-25 DIAGNOSIS — N1831 Chronic kidney disease, stage 3a: Secondary | ICD-10-CM

## 2021-11-25 NOTE — Progress Notes (Signed)
This patient returns to my office for at risk foot care.  This patient requires this care by a professional since this patient will be at risk due to having  CKD,  Diabetes and venous insufficiency.   This patient is unable to cut nails herself  since the patient cannot reach her nails.These nails are painful walking and wearing shoes.  This patient presents for at risk foot care today. She presents to the office with her son..  General Appearance  Alert, conversant and in no acute stress.  Vascular Dorsalis pedis are palpable  B/L.  Posterior tibial pulses are absent due to swelling.  Capillary return  WNL.  Absent hair  B/L.   Neurologic LOPS  Diminished  B/L.  Nails Thick disfigured discolored nails with subungual debris  from hallux to fifth toes bilaterally. No evidence of bacterial infection or drainage bilaterally.  Orthopedic  No limitations of motion  feet .  No crepitus or effusions noted.  No bony pathology or digital deformities noted.  Skin  normotropic skin with no porokeratosis noted bilaterally.  No signs of infections or ulcers noted.     Onychomycosis  Pain in right toes  Pain in left toes  Consent was obtained for treatment procedures.   Mechanical debridement of nails 1-5  bilaterally performed with a nail nipper.  Filed with dremel without incident. No infection or ulcer.  Iatrogenic lesion 4th toe right foot..  Cauterized.   Return office visit   12 weeks       Told patient to return for periodic foot care and evaluation due to potential at risk complications.   Ambrea Hegler DPM  

## 2022-02-27 ENCOUNTER — Ambulatory Visit (INDEPENDENT_AMBULATORY_CARE_PROVIDER_SITE_OTHER): Payer: Medicare Other | Admitting: Podiatry

## 2022-02-27 ENCOUNTER — Encounter: Payer: Self-pay | Admitting: Podiatry

## 2022-02-27 DIAGNOSIS — B351 Tinea unguium: Secondary | ICD-10-CM | POA: Diagnosis not present

## 2022-02-27 DIAGNOSIS — E119 Type 2 diabetes mellitus without complications: Secondary | ICD-10-CM

## 2022-02-27 DIAGNOSIS — N1831 Chronic kidney disease, stage 3a: Secondary | ICD-10-CM

## 2022-02-27 DIAGNOSIS — M79676 Pain in unspecified toe(s): Secondary | ICD-10-CM

## 2022-02-27 DIAGNOSIS — I872 Venous insufficiency (chronic) (peripheral): Secondary | ICD-10-CM

## 2022-02-27 NOTE — Progress Notes (Signed)
This patient returns to my office for at risk foot care.  This patient requires this care by a professional since this patient will be at risk due to having  CKD,  Diabetes and venous insufficiency.   This patient is unable to cut nails herself  since the patient cannot reach her nails.These nails are painful walking and wearing shoes.  This patient presents for at risk foot care today. She presents to the office with her son..  General Appearance  Alert, conversant and in no acute stress.  Vascular Dorsalis pedis are palpable  B/L.  Posterior tibial pulses are absent due to swelling.  Capillary return  WNL.  Absent hair  B/L.   Neurologic LOPS  Diminished  B/L.  Nails Thick disfigured discolored nails with subungual debris  from hallux to fifth toes bilaterally. No evidence of bacterial infection or drainage bilaterally.  Orthopedic  No limitations of motion  feet .  No crepitus or effusions noted.  No bony pathology or digital deformities noted.  Skin  normotropic skin with no porokeratosis noted bilaterally.  No signs of infections or ulcers noted.     Onychomycosis  Pain in right toes  Pain in left toes  Consent was obtained for treatment procedures.   Mechanical debridement of nails 1-5  bilaterally performed with a nail nipper.  Filed with dremel without incident. No infection or ulcer.  Iatrogenic lesion 4th toe right foot..  Cauterized.   Return office visit   12 weeks       Told patient to return for periodic foot care and evaluation due to potential at risk complications.   Helane Gunther DPM

## 2022-06-06 ENCOUNTER — Encounter: Payer: Self-pay | Admitting: Podiatry

## 2022-06-09 ENCOUNTER — Ambulatory Visit: Payer: Medicare Other | Admitting: Podiatry

## 2022-06-23 ENCOUNTER — Ambulatory Visit: Payer: Medicare Other | Admitting: Podiatry

## 2022-07-17 DIAGNOSIS — M7989 Other specified soft tissue disorders: Secondary | ICD-10-CM | POA: Insufficient documentation

## 2022-07-21 ENCOUNTER — Ambulatory Visit: Payer: 59 | Admitting: Podiatry

## 2022-08-18 ENCOUNTER — Ambulatory Visit (INDEPENDENT_AMBULATORY_CARE_PROVIDER_SITE_OTHER): Payer: 59 | Admitting: Podiatry

## 2022-08-18 ENCOUNTER — Encounter: Payer: Self-pay | Admitting: Podiatry

## 2022-08-18 DIAGNOSIS — M2141 Flat foot [pes planus] (acquired), right foot: Secondary | ICD-10-CM

## 2022-08-18 DIAGNOSIS — E1142 Type 2 diabetes mellitus with diabetic polyneuropathy: Secondary | ICD-10-CM

## 2022-08-18 DIAGNOSIS — M7989 Other specified soft tissue disorders: Secondary | ICD-10-CM

## 2022-08-18 DIAGNOSIS — M79676 Pain in unspecified toe(s): Secondary | ICD-10-CM

## 2022-08-18 DIAGNOSIS — B351 Tinea unguium: Secondary | ICD-10-CM | POA: Diagnosis not present

## 2022-08-18 DIAGNOSIS — E119 Type 2 diabetes mellitus without complications: Secondary | ICD-10-CM | POA: Diagnosis not present

## 2022-08-18 DIAGNOSIS — M2142 Flat foot [pes planus] (acquired), left foot: Secondary | ICD-10-CM

## 2022-08-20 NOTE — Progress Notes (Signed)
ANNUAL DIABETIC FOOT EXAM  Subjective: Kelly Hogan presents today annual diabetic foot exam. Her son is present during today's visit. Chief Complaint  Patient presents with   Nail Problem    DFC,Referring Provider Willette Alma A, MD,LOV:01/24,seeing pcp today,A1C:5.5-last office visit:BS today:only cks on Wed.,it was 111      Patient confirms h/o diabetes.  Patient denies any h/o foot wounds.  Patient has been diagnosed with neuropathy.  Risk factors: diabetes, chronic lower extremity edema, HTN, COPD, CHF, CKD, hyperlipidemia, h/o tobacco use in remission.  Elvia Collum, MD is patient's PCP.  Past Medical History:  Diagnosis Date   Asthma    COPD (chronic obstructive pulmonary disease) (HCC)    Diabetes mellitus without complication (HCC)    Hypertension    Morbid obesity with BMI of 50.0-59.9, adult (HCC) 07/13/2019   Stomach ulcer    Patient Active Problem List   Diagnosis Date Noted   Swelling of lower extremity 07/17/2022   Acute on chronic diastolic congestive heart failure (HCC) 02/09/2020   (HFpEF) heart failure with preserved ejection fraction (HCC) 12/21/2019   Hypervolemia 10/18/2019   AKI (acute kidney injury) (HCC) 10/14/2019   Pulmonary edema 10/14/2019   OSA (obstructive sleep apnea) 07/17/2019   Obesity hypoventilation syndrome (HCC) 07/17/2019   Acute on chronic respiratory failure with hypoxia (HCC) 07/13/2019   Morbid obesity with BMI of 50.0-59.9, adult (HCC) 07/13/2019   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/2019   HLD (hyperlipidemia) 07/07/2019   CKD (chronic kidney disease), stage IIIa 07/07/2019   Tobacco abuse 07/07/2019   Generalized weakness 07/07/2019   Hyponatremia 07/07/2019   Hyperkalemia 07/07/2019   Normocytic anemia 07/07/2019   Cellulitis and abscess of toe 03/17/2019   Morbid obesity (HCC) 10/17/2015   Acute low back pain 05/22/2015   Gastric ulcer 09/26/2014   Osteopenia 09/07/2013   H/O  adenomatous polyp of colon 03/20/2013   COPD exacerbation (HCC) 07/05/2012   Acid reflux 05/04/2012   Chronic respiratory failure (HCC) 11/25/2010   Chronic venous insufficiency 01/29/2010   Type 2 diabetes mellitus (HCC) 11/07/2008   Airway hyperreactivity 11/30/1996   Essential (primary) hypertension 11/30/1996   History reviewed. No pertinent surgical history. Current Outpatient Medications on File Prior to Visit  Medication Sig Dispense Refill   acetaminophen (TYLENOL) 500 MG tablet Take 500-1,000 mg by mouth every 6 (six) hours as needed for mild pain or fever.      amLODipine (NORVASC) 5 MG tablet Take 5 mg by mouth daily.     azithromycin (ZITHROMAX) 250 MG tablet Take 250 mg by mouth daily.     carvedilol (COREG) 12.5 MG tablet Take 12.5 mg by mouth in the morning and at bedtime.      Cholecalciferol 25 MCG (1000 UT) tablet Take by mouth.     diclofenac Sodium (VOLTAREN) 1 % GEL Apply 2 g topically 4 (four) times daily.     docusate sodium (COLACE) 100 MG capsule Take 100 mg by mouth 2 (two) times daily.  4   empagliflozin (JARDIANCE) 10 MG TABS tablet Take 1 tablet by mouth daily.     fluticasone (FLONASE) 50 MCG/ACT nasal spray Place 1 spray into both nostrils daily.      furosemide (LASIX) 40 MG tablet Take 40-80 mg by mouth as directed.      Lidocaine 4 % PTCH Place onto the skin.     PROAIR HFA 108 (90 Base) MCG/ACT inhaler Inhale 2 puffs into the lungs every 4 (four)  hours as needed for wheezing or shortness of breath.      spironolactone (ALDACTONE) 25 MG tablet Take 25 mg by mouth daily.  3   telmisartan (MICARDIS) 80 MG tablet Take 80 mg by mouth daily.     torsemide (DEMADEX) 20 MG tablet Take 40 mg by mouth daily.     TRELEGY ELLIPTA 100-62.5-25 MCG/INH AEPB Take 1 puff by mouth daily.     aspirin 81 MG EC tablet Take 81 mg by mouth daily.  (Patient not taking: Reported on 08/18/2022)     hydrALAZINE (APRESOLINE) 25 MG tablet Take 25 mg by mouth 2 (two) times daily.  (Patient not taking: Reported on 08/18/2022)     omeprazole (PRILOSEC) 20 MG capsule Take 20 mg by mouth daily.  (Patient not taking: Reported on 08/18/2022)     omeprazole (PRILOSEC) 20 MG capsule Take by mouth.     Oyster Shell 500 MG TABS Take 500 mg by mouth in the morning, at noon, and at bedtime. (Patient not taking: Reported on 08/18/2022)     simvastatin (ZOCOR) 10 MG tablet Take 10 mg by mouth at bedtime.  (Patient not taking: Reported on 08/18/2022)     No current facility-administered medications on file prior to visit.    Allergies  Allergen Reactions   Enalapril Swelling    angioedema   Oxycodone-Acetaminophen Shortness Of Breath   Ranitidine Hcl Swelling   Aspirin    Hydrochlorothiazide Rash   Penicillins Rash   Social History   Occupational History   Not on file  Tobacco Use   Smoking status: Former    Types: Cigarettes   Smokeless tobacco: Never  Substance and Sexual Activity   Alcohol use: No   Drug use: No   Sexual activity: Not on file   Family History  Problem Relation Age of Onset   Hypertension Sister    Diabetes Mellitus II Brother    Immunization History  Administered Date(s) Administered   Fluad Quad(high Dose 65+) 01/11/2021   Influenza, Seasonal, Injecte, Preservative Fre 02/25/2007, 03/05/2009, 11/27/2009, 04/22/2011, 03/03/2012, 01/05/2013   Influenza,inj,Quad PF,6+ Mos 11/24/2013, 01/25/2015, 02/05/2016, 01/30/2017, 02/02/2018, 01/18/2019, 01/02/2020   PFIZER(Purple Top)SARS-COV-2 Vaccination 06/04/2019, 06/25/2019, 01/11/2020, 09/21/2020   Pfizer Covid-19 Vaccine Bivalent Booster 44yrs & up 03/25/2021   Pneumococcal Conjugate-13 05/31/2013   Pneumococcal Polysaccharide-23 11/12/2004, 01/29/2010, 01/02/2020   Tdap 10/22/2010   Zoster, Live 02/22/2014     Review of Systems: Negative except as noted in the HPI.   Objective: There were no vitals filed for this visit.  Kelly Hogan is a pleasant 82 y.o. female, morbidly obese, in NAD. AAO X  3. Patient on supplemental O2 via nasal cannula.  Vascular Examination: CFT <3 seconds b/l. DP/PT pulses faintly palpable b/l. Skin temperature gradient warm to warm b/l. No pain with calf compression. No ischemia or gangrene. No cyanosis or clubbing noted b/l. Lymphedema present BLE.   Neurological Examination: Pt has subjective symptoms of neuropathy. Protective sensation diminished with 10g monofilament b/l. Proprioception intact bilaterally.  Dermatological Examination: Pedal skin warm and supple b/l.   No open wounds. No interdigital macerations.  Toenails 1-5 b/l thick, discolored, elongated with subungual debris and pain on dorsal palpation.    No hyperkeratotic nor porokeratotic lesions present on today's visit.  Musculoskeletal Examination: Muscle strength 5/5 to b/l LE. Pes planus deformity noted bilateral LE. Utilizes wheelchair for mobility assistance.  Radiographs: None  ADA Risk Categorization: High Risk  Patient has one or more of the following: Loss of  protective sensation Absent pedal pulses Severe Foot deformity History of foot ulcer  Assessment: 1. Pain due to onychomycosis of toenail   2. Swelling of lower extremity   3. Pes planus of both feet   4. Diabetic peripheral neuropathy associated with type 2 diabetes mellitus (HCC)   5. Encounter for diabetic foot exam [facility])     Plan: -Patient's family member present. All questions/concerns addressed on today's visit. -Consent given for treatment as described below: -Examined patient. -Diabetic foot examination performed today. -Continue diabetic foot care principles: inspect feet daily, monitor glucose as recommended by PCP and/or Endocrinologist, and follow prescribed diet per PCP, Endocrinologist and/or dietician. -Patient to continue soft, supportive shoe gear daily. -Toenails 1-5 b/l were debrided in length and girth with sterile nail nippers and dremel without iatrogenic bleeding.  -Patient/POA to call  should there be question/concern in the interim. Return in about 3 months (around 11/18/2022).  Freddie Breech, DPM

## 2022-11-28 ENCOUNTER — Ambulatory Visit (INDEPENDENT_AMBULATORY_CARE_PROVIDER_SITE_OTHER): Payer: 59 | Admitting: Podiatry

## 2022-11-28 ENCOUNTER — Encounter: Payer: Self-pay | Admitting: Podiatry

## 2022-11-28 DIAGNOSIS — M79676 Pain in unspecified toe(s): Secondary | ICD-10-CM | POA: Diagnosis not present

## 2022-11-28 DIAGNOSIS — E1142 Type 2 diabetes mellitus with diabetic polyneuropathy: Secondary | ICD-10-CM

## 2022-11-28 DIAGNOSIS — B351 Tinea unguium: Secondary | ICD-10-CM

## 2022-12-05 ENCOUNTER — Encounter: Payer: Self-pay | Admitting: Podiatry

## 2022-12-05 NOTE — Progress Notes (Signed)
Subjective:  Patient ID: Kelly Hogan, female    DOB: 12-Nov-1940,  MRN: [id number]  Kelly Hogan presents to clinic today for at risk foot care with history of diabetic neuropathy and painful elongated mycotic toenails 1-5 bilaterally which are tender when wearing enclosed shoe gear. Pain is relieved with periodic professional debridement. She is accompanied by her son on today's visit. Chief Complaint  Patient presents with   Diabetes    "Cut the toenails."   New problem(s): None.   PCP is Elvia Collum, MD.  Allergies  Allergen Reactions   Enalapril Swelling    angioedema   Oxycodone-Acetaminophen Shortness Of Breath   Ranitidine Hcl Swelling   Aspirin    Hydrochlorothiazide Rash   Penicillins Rash    Review of Systems: Negative except as noted in the HPI.  Objective: No changes noted in today's physical examination. There were no vitals filed for this visit. Kelly Hogan is a pleasant 82 y.o. female morbidly obese in NAD. AAO x 3. Patient on supplemental oxygen.  Vascular Examination: CFT <3 seconds b/l. DP/PT pulses faintly palpable b/l. Skin temperature gradient warm to warm b/l. No pain with calf compression. No ischemia or gangrene. No cyanosis or clubbing noted b/l. Lymphedema present BLE.   Neurological Examination: Pt has subjective symptoms of neuropathy. Protective sensation diminished with 10g monofilament b/l. Proprioception intact bilaterally.  Dermatological Examination: Pedal skin warm and supple b/l.   No open wounds. No interdigital macerations.  Toenails 1-5 b/l thick, discolored, elongated with subungual debris and pain on dorsal palpation.    No hyperkeratotic nor porokeratotic lesions present on today's visit.  Musculoskeletal Examination: Muscle strength 5/5 to b/l LE. Pes planus deformity noted bilateral LE. Utilizes wheelchair for mobility assistance.  Radiographs: None  Assessment/Plan: 1. Pain due to onychomycosis of  toenail   2. Diabetic peripheral neuropathy associated with type 2 diabetes mellitus (HCC)    -Consent given for treatment as described below: -Examined patient. -No new findings. No new orders. -Continue foot and shoe inspections daily. Monitor blood glucose per PCP/Endocrinologist's recommendations. -Continue supportive shoe gear daily. -Mycotic toenails 1-5 bilaterally were debrided in length and girth with sterile nail nippers and dremel without incident. -Patient/POA to call should there be question/concern in the interim.   Return in about 3 months (around 02/27/2023).  Freddie Breech, DPM

## 2022-12-17 DIAGNOSIS — R300 Dysuria: Secondary | ICD-10-CM | POA: Insufficient documentation

## 2023-02-27 ENCOUNTER — Ambulatory Visit: Payer: 59 | Admitting: Podiatry

## 2023-02-27 ENCOUNTER — Encounter: Payer: Self-pay | Admitting: Podiatry

## 2023-02-27 VITALS — Ht 64.0 in | Wt 309.0 lb

## 2023-02-27 DIAGNOSIS — M79676 Pain in unspecified toe(s): Secondary | ICD-10-CM | POA: Diagnosis not present

## 2023-02-27 DIAGNOSIS — B351 Tinea unguium: Secondary | ICD-10-CM

## 2023-02-27 DIAGNOSIS — E1142 Type 2 diabetes mellitus with diabetic polyneuropathy: Secondary | ICD-10-CM

## 2023-02-27 NOTE — Progress Notes (Signed)
  Subjective:  Patient ID: Kelly Hogan, female    DOB: October 28, 1940,  MRN: [id number]  82 y.o. female presents at risk foot care with history of diabetic neuropathy and painful thick toenails that are difficult to trim. Pain interferes with ambulation. Aggravating factors include wearing enclosed shoe gear. Pain is relieved with periodic professional debridement. Her son is present during today's visit.  New problem(s): None   PCP is Elvia Collum, MD , and last visit was December 17, 2022.  Allergies  Allergen Reactions   Enalapril Swelling    angioedema   Oxycodone-Acetaminophen Shortness Of Breath   Ranitidine Hcl Swelling   Aspirin    Hydrochlorothiazide Rash   Penicillins Rash    Review of Systems: Negative except as noted in the HPI.   Objective:  Kelly Hogan is a pleasant 82 y.o. female morbidly obese in NAD. AAO x 3. On supplemental O2 via nasal cannula.  Vascular Examination: Faintly palpable pedal pulses.CFT <3 seconds b/l LE. Pedal hair absent. No pain with calf compression b/l. Lymphedema present BLE.   Neurological Examination: Pt has subjective symptoms of neuropathy. Protective sensation diminished with 10g monofilament b/l.   Dermatological Examination: Pedal skin with normal turgor, texture and tone b/l. No open wounds nor interdigital macerations noted. Toenails 1-5 b/l thick, discolored, elongated with subungual debris and pain on dorsal palpation. No hyperkeratotic lesions noted b/l.   Musculoskeletal Examination: Muscle strength 5/5 to b/l LE.  No pain, crepitus noted b/l. Flat feet b/l. Utilizes wheelchair for mobility assistance.  Radiographs: None  Last A1c:       No data to display         Assessment:   1. Pain due to onychomycosis of toenail   2. Diabetic peripheral neuropathy associated with type 2 diabetes mellitus (HCC)    Plan:  -Patient's family member present. All questions/concerns addressed on today's visit. -Continue  foot and shoe inspections daily. Monitor blood glucose per PCP/Endocrinologist's recommendations. -Patient to continue soft, supportive shoe gear daily. -Toenails 1-5 b/l were debrided in length and girth with sterile nail nippers and dremel without iatrogenic bleeding.  -Patient/POA to call should there be question/concern in the interim.  Return in about 3 months (around 05/28/2023).  Freddie Breech, DPM      [place] LOCATION: [address], [place] [address]                   Office [phone]   [facility] LOCATION: [address] [place], [place] [address] Office [phone]

## 2023-03-07 ENCOUNTER — Encounter: Payer: Self-pay | Admitting: Podiatry

## 2023-05-28 ENCOUNTER — Ambulatory Visit: Payer: 59 | Admitting: Podiatry

## 2023-06-05 ENCOUNTER — Encounter: Payer: Self-pay | Admitting: Podiatry

## 2023-06-05 ENCOUNTER — Ambulatory Visit: Admitting: Podiatry

## 2023-06-05 VITALS — Ht 64.0 in | Wt 309.0 lb

## 2023-06-05 DIAGNOSIS — E1142 Type 2 diabetes mellitus with diabetic polyneuropathy: Secondary | ICD-10-CM | POA: Diagnosis not present

## 2023-06-05 DIAGNOSIS — B351 Tinea unguium: Secondary | ICD-10-CM

## 2023-06-05 DIAGNOSIS — M79676 Pain in unspecified toe(s): Secondary | ICD-10-CM | POA: Diagnosis not present

## 2023-06-12 ENCOUNTER — Encounter: Payer: Self-pay | Admitting: Podiatry

## 2023-06-12 NOTE — Progress Notes (Signed)
  Subjective:  Patient ID: Kelly Hogan, female    DOB: 1940/06/30,  MRN: [id number]  83 y.o. female presents at risk foot care with history of diabetic neuropathy and painful, elongated thickened toenails x 10 which are symptomatic when wearing enclosed shoe gear. This interferes with his/her daily activities. Her son is present during today's visit. Chief Complaint  Patient presents with   Nail Problem    Pt is here for [facility] last A1C was 6.2 PCP is Dr Madelon Lips and LOV was in January.   New problem(s): None   PCP is Kelly Collum, MD.  Allergies  Allergen Reactions   Enalapril Swelling    angioedema   Oxycodone-Acetaminophen Shortness Of Breath   Ranitidine Hcl Swelling   Aspirin    Hydrochlorothiazide Rash   Penicillins Rash    Review of Systems: Negative except as noted in the HPI.   Objective:  Kelly Hogan is a pleasant 83 y.o. female morbidly obese in NAD. On supplemental oygen via nasal cannula. AAO x 3.  Vascular Examination: Faintly palpable pedal pulses.CFT <3 seconds b/l LE. Pedal hair absent. No pain with calf compression b/l. Lymphedema present BLE.   Neurological Examination: Pt has subjective symptoms of neuropathy. Protective sensation diminished with 10g monofilament b/l.   Dermatological Examination: Pedal skin with normal turgor, texture and tone b/l. No open wounds nor interdigital macerations noted. Toenails 1-5 b/l thick, discolored, elongated with subungual debris and pain on dorsal palpation. No hyperkeratotic lesions noted b/l.   Musculoskeletal Examination: Muscle strength 5/5 to b/l LE.  No pain, crepitus noted b/l. Pes planus b/l. Utilizes wheelchair for mobility assistance.  Radiographs: None   Assessment:   1. Pain due to onychomycosis of toenail   2. Diabetic peripheral neuropathy associated with type 2 diabetes mellitus (HCC)    Plan:  Consent given for treatment. Patient examined. All patient's and/or POA's questions/concerns  addressed on today's visit. Mycotic toenails 1-5 debrided in length and girth without incident. Continue foot and shoe inspections daily. Monitor blood glucose per PCP/Endocrinologist's recommendations.Continue soft, supportive shoe gear daily. Report any pedal injuries to medical professional. Call office if there are any quesitons/concerns. -Patient/POA to call should there be question/concern in the interim.  Return in about 3 months (around 09/05/2023).  Freddie Breech, DPM      [place] LOCATION: [address] [place] [address]                   Office [phone]   [facility] LOCATION: [address] [place], [place] [address] Office [phone]

## 2023-08-14 DIAGNOSIS — N170 Acute kidney failure with tubular necrosis: Secondary | ICD-10-CM | POA: Insufficient documentation

## 2023-09-10 ENCOUNTER — Ambulatory Visit: Admitting: Podiatry

## 2023-09-14 ENCOUNTER — Encounter: Payer: Self-pay | Admitting: Podiatry

## 2023-09-14 ENCOUNTER — Ambulatory Visit (INDEPENDENT_AMBULATORY_CARE_PROVIDER_SITE_OTHER): Admitting: Podiatry

## 2023-09-14 DIAGNOSIS — E1142 Type 2 diabetes mellitus with diabetic polyneuropathy: Secondary | ICD-10-CM

## 2023-09-14 DIAGNOSIS — B351 Tinea unguium: Secondary | ICD-10-CM

## 2023-09-14 DIAGNOSIS — M79676 Pain in unspecified toe(s): Secondary | ICD-10-CM

## 2023-09-14 NOTE — Progress Notes (Signed)
  Subjective:  Patient ID: Kelly Hogan, female    DOB: [birth year],  MRN: [id number]  Kelly Hogan presents to clinic today for at risk foot care with history of diabetic neuropathy and painful mycotic toenails of both feet that are difficult to trim. Pain interferes with daily activities and wearing enclosed shoe gear comfortably.  Chief Complaint  Patient presents with   [facility]    Rm3 DFC/Not Diabetic/ Dr [name] Last visit August 21, 2023   New problem(s): None.   PCP is [name] [name] LABOR, MD.  Allergies  Allergen Reactions   Enalapril Swelling    angioedema   Oxycodone-Acetaminophen  Shortness Of Breath   Ranitidine Hcl Swelling   Aspirin     Hydrochlorothiazide Rash   Penicillins Rash    Review of Systems: Negative except as noted in the HPI.  Objective: No changes noted in today's physical examination. There were no vitals filed for this visit. Kelly Hogan is a pleasant 83 y.o. female in NAD. AAO x 3.  Vascular Examination: Faintly palpable pedal pulses.CFT <3 seconds b/l LE. Pedal hair absent. No pain with calf compression b/l. Lymphedema present BLE.  Neurological Examination: Pt has subjective symptoms of neuropathy. Protective sensation diminished with 10g monofilament b/l.   Dermatological Examination: Pedal skin with normal turgor, texture and tone b/l. No open wounds nor interdigital macerations noted. Toenails 1-5 b/l thick, discolored, elongated with subungual debris and pain on dorsal palpation. No hyperkeratotic lesions noted b/l.   Musculoskeletal Examination: Muscle strength 5/5 to b/l LE.  No pain, crepitus noted b/l. Pes planus b/l. Utilizes wheelchair for mobility assistance.  Radiographs: None  Assessment/Plan: 1. Pain due to onychomycosis of toenail   2. Diabetic peripheral neuropathy associated with type 2 diabetes mellitus [facility])   Consent given for treatment. Patient examined. All patient's and/or POA's questions/concerns addressed on  today's visit. Mycotic toenails 1-5 debrided in length and girth without incident. Continue foot and shoe inspections daily. Monitor blood glucose per PCP/Endocrinologist's recommendations.Continue soft, supportive shoe gear daily. Report any pedal injuries to medical professional. Call office if there are any quesitons/concerns. -Patient/POA to call should there be question/concern in the interim.   No follow-ups on file.  Delon LITTIE Merlin, DPM      [place] LOCATION: [address] [place] [address]                   Office [phone]   [facility] LOCATION: [address] [place], [place] [address] Office [phone]

## 2023-12-21 ENCOUNTER — Encounter: Payer: Self-pay | Admitting: Podiatry

## 2023-12-21 ENCOUNTER — Ambulatory Visit: Admitting: Podiatry

## 2023-12-21 DIAGNOSIS — B351 Tinea unguium: Secondary | ICD-10-CM

## 2023-12-21 DIAGNOSIS — M79676 Pain in unspecified toe(s): Secondary | ICD-10-CM

## 2023-12-21 DIAGNOSIS — E1142 Type 2 diabetes mellitus with diabetic polyneuropathy: Secondary | ICD-10-CM | POA: Diagnosis not present

## 2023-12-27 NOTE — Progress Notes (Signed)
  Subjective:  Patient ID: Kelly Hogan, female    DOB: 01/18/1941,  MRN: [id number]  Kelly Hogan presents to clinic today for at risk foot care with history of diabetic neuropathy and painful thick toenails that are difficult to trim. Pain interferes with ambulation. Aggravating factors include wearing enclosed shoe gear. Pain is relieved with periodic professional debridement. She is accompanied by her son on today's visit. Chief Complaint  Patient presents with   Toe Pain    Diabetic foot care. A1c is 5 something Dr. [name] is her PCP. Last visit was in June 2025   New problem(s): None.   PCP is [name] [name] LABOR, MD.  Allergies  Allergen Reactions   Enalapril Swelling    angioedema   Oxycodone-Acetaminophen  Shortness Of Breath   Ranitidine Hcl Swelling   Aspirin     Hydrochlorothiazide Rash   Penicillins Rash    Review of Systems: Negative except as noted in the HPI.  Objective: No changes noted in today's physical examination. There were no vitals filed for this visit. Kelly Hogan is a pleasant 83 y.o. female morbidly obese in NAD. AAO x 3. On  supplemental oxygen.  Vascular Examination: CFT <3 seconds b/l. DP/PT pulses faintly palpable b/l. Digital hair absent.  Skin temperature gradient warm to warm b/l. No pain with calf compression. No ischemia or gangrene. No cyanosis or clubbing noted b/l. Lymphedema present BLE.   Neurological Examination: Sensation grossly intact b/l with 10 gram monofilament. Vibratory sensation intact b/l. Pt has subjective symptoms of neuropathy.  Dermatological Examination: Pedal skin warm and supple b/l.   No open wounds. No interdigital macerations.  Toenails 1-5 b/l thick, discolored, elongated with subungual debris and pain on dorsal palpation.    No corns, calluses, nor porokeratotic lesions.  Musculoskeletal Examination: Muscle strength 5/5 to all lower extremity muscle groups bilaterally. Pes planus deformity noted  bilateral LE. Utilizes wheelchair for mobility assistance.  Radiographs: None  Assessment/Plan: 1. Pain due to onychomycosis of toenail   2. Diabetic peripheral neuropathy associated with type 2 diabetes mellitus (HCC)   Patient was evaluated and treated. All patient's and/or POA's questions/concerns addressed on today's visit. Toenails 1-5 debrided in length and girth without incident. Continue foot and shoe inspections daily. Monitor blood glucose per PCP/Endocrinologist's recommendations. Continue soft, supportive shoe gear daily. Report any pedal injuries to medical professional. Call office if there are any questions/concerns. -Patient/POA to call should there be question/concern in the interim.   Return in about 3 months (around 03/22/2024).  Delon LITTIE Merlin, DPM      [place] LOCATION: [address] [place] [address]                   Office [phone]   [facility] LOCATION: [address] [place], [place] [address] Office [phone]

## 2024-01-26 ENCOUNTER — Inpatient Hospital Stay
Admission: EM | Admit: 2024-01-26 | Discharge: 2024-02-08 | DRG: 871 | Disposition: A | Discharge status: Skilled Nursing Facility | Attending: Internal Medicine | Admitting: Internal Medicine

## 2024-01-26 ENCOUNTER — Emergency Department

## 2024-01-26 DIAGNOSIS — R682 Dry mouth, unspecified: Secondary | ICD-10-CM | POA: Diagnosis not present

## 2024-01-26 DIAGNOSIS — Z79899 Other long term (current) drug therapy: Secondary | ICD-10-CM

## 2024-01-26 DIAGNOSIS — Z6841 Body Mass Index (BMI) 40.0 and over, adult: Secondary | ICD-10-CM | POA: Diagnosis not present

## 2024-01-26 DIAGNOSIS — N184 Chronic kidney disease, stage 4 (severe): Secondary | ICD-10-CM | POA: Diagnosis present

## 2024-01-26 DIAGNOSIS — L03116 Cellulitis of left lower limb: Secondary | ICD-10-CM | POA: Diagnosis present

## 2024-01-26 DIAGNOSIS — G4733 Obstructive sleep apnea (adult) (pediatric): Secondary | ICD-10-CM | POA: Diagnosis present

## 2024-01-26 DIAGNOSIS — I890 Lymphedema, not elsewhere classified: Secondary | ICD-10-CM | POA: Diagnosis present

## 2024-01-26 DIAGNOSIS — N179 Acute kidney failure, unspecified: Secondary | ICD-10-CM | POA: Diagnosis present

## 2024-01-26 DIAGNOSIS — I872 Venous insufficiency (chronic) (peripheral): Secondary | ICD-10-CM | POA: Diagnosis present

## 2024-01-26 DIAGNOSIS — R7881 Bacteremia: Secondary | ICD-10-CM

## 2024-01-26 DIAGNOSIS — G9341 Metabolic encephalopathy: Secondary | ICD-10-CM | POA: Diagnosis not present

## 2024-01-26 DIAGNOSIS — I5033 Acute on chronic diastolic (congestive) heart failure: Secondary | ICD-10-CM | POA: Diagnosis not present

## 2024-01-26 DIAGNOSIS — J4489 Other specified chronic obstructive pulmonary disease: Secondary | ICD-10-CM | POA: Diagnosis present

## 2024-01-26 DIAGNOSIS — J9622 Acute and chronic respiratory failure with hypercapnia: Secondary | ICD-10-CM | POA: Diagnosis not present

## 2024-01-26 DIAGNOSIS — Z833 Family history of diabetes mellitus: Secondary | ICD-10-CM

## 2024-01-26 DIAGNOSIS — Z7901 Long term (current) use of anticoagulants: Secondary | ICD-10-CM

## 2024-01-26 DIAGNOSIS — R0689 Other abnormalities of breathing: Secondary | ICD-10-CM

## 2024-01-26 DIAGNOSIS — Z880 Allergy status to penicillin: Secondary | ICD-10-CM

## 2024-01-26 DIAGNOSIS — I959 Hypotension, unspecified: Secondary | ICD-10-CM | POA: Diagnosis present

## 2024-01-26 DIAGNOSIS — E119 Type 2 diabetes mellitus without complications: Secondary | ICD-10-CM

## 2024-01-26 DIAGNOSIS — I480 Paroxysmal atrial fibrillation: Secondary | ICD-10-CM | POA: Diagnosis not present

## 2024-01-26 DIAGNOSIS — Z885 Allergy status to narcotic agent status: Secondary | ICD-10-CM

## 2024-01-26 DIAGNOSIS — Z7951 Long term (current) use of inhaled steroids: Secondary | ICD-10-CM

## 2024-01-26 DIAGNOSIS — I471 Supraventricular tachycardia, unspecified: Secondary | ICD-10-CM | POA: Diagnosis present

## 2024-01-26 DIAGNOSIS — Z66 Do not resuscitate: Secondary | ICD-10-CM | POA: Diagnosis present

## 2024-01-26 DIAGNOSIS — E66813 Obesity, class 3: Secondary | ICD-10-CM | POA: Diagnosis present

## 2024-01-26 DIAGNOSIS — Z7984 Long term (current) use of oral hypoglycemic drugs: Secondary | ICD-10-CM

## 2024-01-26 DIAGNOSIS — I10 Essential (primary) hypertension: Secondary | ICD-10-CM | POA: Diagnosis present

## 2024-01-26 DIAGNOSIS — Z1152 Encounter for screening for COVID-19: Secondary | ICD-10-CM | POA: Diagnosis not present

## 2024-01-26 DIAGNOSIS — Z8249 Family history of ischemic heart disease and other diseases of the circulatory system: Secondary | ICD-10-CM

## 2024-01-26 DIAGNOSIS — I130 Hypertensive heart and chronic kidney disease with heart failure and stage 1 through stage 4 chronic kidney disease, or unspecified chronic kidney disease: Secondary | ICD-10-CM | POA: Diagnosis present

## 2024-01-26 DIAGNOSIS — A419 Sepsis, unspecified organism: Principal | ICD-10-CM | POA: Diagnosis present

## 2024-01-26 DIAGNOSIS — J9621 Acute and chronic respiratory failure with hypoxia: Secondary | ICD-10-CM | POA: Diagnosis not present

## 2024-01-26 DIAGNOSIS — G253 Myoclonus: Secondary | ICD-10-CM | POA: Diagnosis not present

## 2024-01-26 DIAGNOSIS — L0390 Cellulitis, unspecified: Secondary | ICD-10-CM

## 2024-01-26 DIAGNOSIS — Z23 Encounter for immunization: Secondary | ICD-10-CM

## 2024-01-26 DIAGNOSIS — E1122 Type 2 diabetes mellitus with diabetic chronic kidney disease: Secondary | ICD-10-CM | POA: Diagnosis present

## 2024-01-26 DIAGNOSIS — B965 Pseudomonas (aeruginosa) (mallei) (pseudomallei) as the cause of diseases classified elsewhere: Secondary | ICD-10-CM

## 2024-01-26 DIAGNOSIS — A4152 Sepsis due to Pseudomonas: Principal | ICD-10-CM | POA: Diagnosis present

## 2024-01-26 DIAGNOSIS — E875 Hyperkalemia: Secondary | ICD-10-CM | POA: Diagnosis not present

## 2024-01-26 DIAGNOSIS — Z8711 Personal history of peptic ulcer disease: Secondary | ICD-10-CM

## 2024-01-26 DIAGNOSIS — I5030 Unspecified diastolic (congestive) heart failure: Secondary | ICD-10-CM | POA: Diagnosis not present

## 2024-01-26 DIAGNOSIS — D631 Anemia in chronic kidney disease: Secondary | ICD-10-CM | POA: Diagnosis present

## 2024-01-26 DIAGNOSIS — E8729 Other acidosis: Secondary | ICD-10-CM | POA: Diagnosis present

## 2024-01-26 DIAGNOSIS — Z9981 Dependence on supplemental oxygen: Secondary | ICD-10-CM

## 2024-01-26 DIAGNOSIS — Z888 Allergy status to other drugs, medicaments and biological substances status: Secondary | ICD-10-CM

## 2024-01-26 DIAGNOSIS — Z7982 Long term (current) use of aspirin: Secondary | ICD-10-CM

## 2024-01-26 DIAGNOSIS — M25511 Pain in right shoulder: Secondary | ICD-10-CM | POA: Diagnosis not present

## 2024-01-26 DIAGNOSIS — E785 Hyperlipidemia, unspecified: Secondary | ICD-10-CM | POA: Diagnosis present

## 2024-01-26 DIAGNOSIS — I878 Other specified disorders of veins: Secondary | ICD-10-CM | POA: Diagnosis present

## 2024-01-26 DIAGNOSIS — D72829 Elevated white blood cell count, unspecified: Secondary | ICD-10-CM | POA: Diagnosis not present

## 2024-01-26 DIAGNOSIS — E662 Morbid (severe) obesity with alveolar hypoventilation: Secondary | ICD-10-CM | POA: Diagnosis present

## 2024-01-26 DIAGNOSIS — Z87891 Personal history of nicotine dependence: Secondary | ICD-10-CM

## 2024-01-26 DIAGNOSIS — N189 Chronic kidney disease, unspecified: Secondary | ICD-10-CM | POA: Diagnosis not present

## 2024-01-26 DIAGNOSIS — Z886 Allergy status to analgesic agent status: Secondary | ICD-10-CM

## 2024-01-26 LAB — CBC WITH DIFFERENTIAL/PLATELET
Abs Immature Granulocytes: 0.03 K/uL (ref 0.00–0.07)
Basophils Absolute: 0 K/uL (ref 0.0–0.1)
Basophils Relative: 1 %
Eosinophils Absolute: 0.1 K/uL (ref 0.0–0.5)
Eosinophils Relative: 1 %
HCT: 35 % — ABNORMAL LOW (ref 36.0–46.0)
Hemoglobin: 10.6 g/dL — ABNORMAL LOW (ref 12.0–15.0)
Immature Granulocytes: 0 %
Lymphocytes Relative: 5 %
Lymphs Abs: 0.4 K/uL — ABNORMAL LOW (ref 0.7–4.0)
MCH: 28.2 pg (ref 26.0–34.0)
MCHC: 30.3 g/dL (ref 30.0–36.0)
MCV: 93.1 fL (ref 80.0–100.0)
Monocytes Absolute: 0.1 K/uL (ref 0.1–1.0)
Monocytes Relative: 1 %
Neutro Abs: 7.9 K/uL — ABNORMAL HIGH (ref 1.7–7.7)
Neutrophils Relative %: 92 %
Platelets: 325 K/uL (ref 150–400)
RBC: 3.76 MIL/uL — ABNORMAL LOW (ref 3.87–5.11)
RDW: 13.5 % (ref 11.5–15.5)
WBC: 8.5 K/uL (ref 4.0–10.5)
nRBC: 0 % (ref 0.0–0.2)

## 2024-01-26 LAB — COMPREHENSIVE METABOLIC PANEL WITH GFR
ALT: 8 U/L (ref 0–44)
AST: 16 U/L (ref 15–41)
Albumin: 3.8 g/dL (ref 3.5–5.0)
Alkaline Phosphatase: 45 U/L (ref 38–126)
Anion gap: 13 (ref 5–15)
BUN: 54 mg/dL — ABNORMAL HIGH (ref 8–23)
CO2: 24 mmol/L (ref 22–32)
Calcium: 9.1 mg/dL (ref 8.9–10.3)
Chloride: 102 mmol/L (ref 98–111)
Creatinine, Ser: 2.03 mg/dL — ABNORMAL HIGH (ref 0.44–1.00)
GFR, Estimated: 24 mL/min — ABNORMAL LOW (ref >60)
Glucose, Bld: 133 mg/dL — ABNORMAL HIGH (ref 70–99)
Potassium: 4.7 mmol/L (ref 3.5–5.1)
Sodium: 139 mmol/L (ref 135–145)
Total Bilirubin: 0.4 mg/dL (ref 0.0–1.2)
Total Protein: 6.8 g/dL (ref 6.5–8.1)

## 2024-01-26 LAB — URINALYSIS, W/ REFLEX TO CULTURE (INFECTION SUSPECTED)
Bacteria, UA: NONE SEEN
Bilirubin Urine: NEGATIVE
Glucose, UA: 150 mg/dL — AB
Hgb urine dipstick: NEGATIVE
Ketones, ur: NEGATIVE mg/dL
Leukocytes,Ua: NEGATIVE
Nitrite: NEGATIVE
Protein, ur: 100 mg/dL — AB
Specific Gravity, Urine: 1.009 (ref 1.005–1.030)
pH: 5 (ref 5.0–8.0)

## 2024-01-26 LAB — TSH: TSH: 5.65 u[IU]/mL — ABNORMAL HIGH (ref 0.350–4.500)

## 2024-01-26 LAB — RESP PANEL BY RT-PCR (RSV, FLU A&B, COVID)  RVPGX2
Influenza A by PCR: NEGATIVE
Influenza B by PCR: NEGATIVE
Resp Syncytial Virus by PCR: NEGATIVE
SARS Coronavirus 2 by RT PCR: NEGATIVE

## 2024-01-26 LAB — PROTIME-INR
INR: 1 (ref 0.8–1.2)
Prothrombin Time: 13.8 s (ref 11.4–15.2)

## 2024-01-26 LAB — LACTIC ACID, PLASMA: Lactic Acid, Venous: 1.5 mmol/L (ref 0.5–1.9)

## 2024-01-26 LAB — MAGNESIUM: Magnesium: 2.2 mg/dL (ref 1.7–2.4)

## 2024-01-26 LAB — PRO BRAIN NATRIURETIC PEPTIDE: Pro Brain Natriuretic Peptide: 121 pg/mL (ref <300.0)

## 2024-01-26 LAB — TROPONIN T, HIGH SENSITIVITY: Troponin T High Sensitivity: 89 ng/L — ABNORMAL HIGH (ref 0–19)

## 2024-01-26 MED ORDER — ADENOSINE 6 MG/2ML IV SOLN
6.0000 mg | Freq: Once | INTRAVENOUS | Status: AC
  Administered 2024-01-26: 6 mg via INTRAVENOUS
  Filled 2024-01-26: qty 2

## 2024-01-26 MED ORDER — ALBUMIN HUMAN 25 % IV SOLN
50.0000 g | Freq: Once | INTRAVENOUS | Status: AC
  Administered 2024-01-26: 50 g via INTRAVENOUS
  Filled 2024-01-26: qty 200

## 2024-01-26 MED ORDER — ACETAMINOPHEN 325 MG PO TABS
650.0000 mg | ORAL_TABLET | Freq: Four times a day (QID) | ORAL | Status: DC | PRN
  Administered 2024-02-01 – 2024-02-06 (×4): 650 mg via ORAL
  Filled 2024-01-26 (×5): qty 2

## 2024-01-26 MED ORDER — SENNOSIDES-DOCUSATE SODIUM 8.6-50 MG PO TABS
1.0000 | ORAL_TABLET | Freq: Every evening | ORAL | Status: DC | PRN
  Administered 2024-01-29: 1 via ORAL
  Filled 2024-01-26: qty 1

## 2024-01-26 MED ORDER — ACETAMINOPHEN 650 MG RE SUPP
650.0000 mg | Freq: Four times a day (QID) | RECTAL | Status: DC | PRN
Start: 2024-01-26 — End: 2024-02-08
  Administered 2024-01-27: 650 mg via RECTAL
  Filled 2024-01-26: qty 2

## 2024-01-26 MED ORDER — METOPROLOL TARTRATE 5 MG/5ML IV SOLN
2.5000 mg | Freq: Once | INTRAVENOUS | Status: AC
  Administered 2024-01-26: 2.5 mg via INTRAVENOUS
  Filled 2024-01-26: qty 5

## 2024-01-26 MED ORDER — AMIODARONE HCL IN DEXTROSE 360-4.14 MG/200ML-% IV SOLN
60.0000 mg/h | INTRAVENOUS | Status: AC
  Administered 2024-01-26 (×2): 60 mg/h via INTRAVENOUS
  Filled 2024-01-26 (×2): qty 200

## 2024-01-26 MED ORDER — SODIUM CHLORIDE 0.9% FLUSH
3.0000 mL | Freq: Two times a day (BID) | INTRAVENOUS | Status: DC
  Administered 2024-01-26 – 2024-02-08 (×26): 3 mL via INTRAVENOUS

## 2024-01-26 MED ORDER — ENOXAPARIN SODIUM 40 MG/0.4ML IJ SOSY
40.0000 mg | PREFILLED_SYRINGE | INTRAMUSCULAR | Status: DC
  Administered 2024-01-26: 40 mg via SUBCUTANEOUS
  Filled 2024-01-26: qty 0.4

## 2024-01-26 MED ORDER — SODIUM CHLORIDE 0.9 % IV BOLUS
1000.0000 mL | Freq: Once | INTRAVENOUS | Status: AC
  Administered 2024-01-26: 1000 mL via INTRAVENOUS

## 2024-01-26 MED ORDER — AMIODARONE HCL IN DEXTROSE 360-4.14 MG/200ML-% IV SOLN
30.0000 mg/h | INTRAVENOUS | Status: AC
  Administered 2024-01-27 – 2024-01-29 (×5): 30 mg/h via INTRAVENOUS
  Filled 2024-01-26 (×5): qty 200

## 2024-01-26 MED ORDER — METOPROLOL TARTRATE 25 MG PO TABS
25.0000 mg | ORAL_TABLET | Freq: Once | ORAL | Status: AC
  Administered 2024-01-26: 25 mg via ORAL
  Filled 2024-01-26: qty 1

## 2024-01-26 MED ORDER — LACTATED RINGERS IV SOLN
150.0000 mL/h | INTRAVENOUS | Status: DC
  Administered 2024-01-27: 150 mL/h via INTRAVENOUS

## 2024-01-26 MED ORDER — SODIUM CHLORIDE 0.9 % IV SOLN
2.0000 g | Freq: Once | INTRAVENOUS | Status: AC
  Administered 2024-01-26: 2 g via INTRAVENOUS
  Filled 2024-01-26: qty 12.5

## 2024-01-26 MED ORDER — VANCOMYCIN HCL IN DEXTROSE 1-5 GM/200ML-% IV SOLN
1000.0000 mg | Freq: Once | INTRAVENOUS | Status: AC
  Administered 2024-01-26: 1000 mg via INTRAVENOUS
  Filled 2024-01-26: qty 200

## 2024-01-26 MED ORDER — AMIODARONE LOAD VIA INFUSION
150.0000 mg | Freq: Once | INTRAVENOUS | Status: AC
  Administered 2024-01-26: 150 mg via INTRAVENOUS
  Filled 2024-01-26: qty 83.34

## 2024-01-26 MED ORDER — METRONIDAZOLE 500 MG/100ML IV SOLN
500.0000 mg | Freq: Once | INTRAVENOUS | Status: AC
  Administered 2024-01-26: 500 mg via INTRAVENOUS
  Filled 2024-01-26: qty 100

## 2024-01-26 NOTE — ED Provider Notes (Signed)
-----------------------------------------   8:59 PM on 01/26/2024 -----------------------------------------  I took over care for this patient from Dr. Ernest.  The patient had no response to her heart rate from the oral metoprolol.  She had a few moments when she would go down to the 90s and the sinus rhythm but then resumed the narrow complex tachycardia.  I initially consulted Dr. Roann from the hospitalist service however based on further discussion about the persistent tachycardia, he requested that we hold off on admission until the patient was stabilized further.    After performing a repeat EKG, confirming a narrow complex tachycardia with no P waves, consistent with SVT, we then tried a dose of adenosine.  The patient returned to a sinus rhythm briefly but then went back into the narrow complex tachycardia after about 20 or 30 seconds.  I then consulted and discussed the case with Dr. Ammon from cardiology who recommended trying an amiodarone bolus and infusion.  This was initiated.  The patient had a good response and her blood pressure has now improved along with a decrease in her heart rate to the 80s.  I consulted Dr. Nelida from the hospitalist service; based on our discussion she agreed to evaluate the patient for admission.   Kelly Pae, MD 01/27/24 0006

## 2024-01-26 NOTE — ED Provider Notes (Addendum)
 [facility] Provider Note    Event Date/Time   First MD Initiated Contact with Patient 01/26/24 1259     (approximate)   History   Code Sepsis   HPI  Kelly Hogan is a 83 y.o. female with history of COPD on 4 L at baseline, heart failure, chronic venous stasis, OSA on BiPAP, CKD, diabetes who comes in with concerns for sepsis.  Patient was seen yesterday over telemedicine due to concerns for some redness on her legs more on the left.  She reports that she was prescribed antibiotics but she has not started them.  She reported calling EMS today as she felt fine this morning but then she had sudden onset of fevers and chills.  Patient was tachycardic therefore brought over here for evaluation.  They did give 1 g of Tylenol  and some Zofran .  Patient denies any falls hitting her head, chest pain abdominal pain, flank pain, urinary symptoms or other concerns.  Temperature with EMS was 103   Physical Exam   Triage Vital Signs: ED Triage Vitals  Encounter Vitals Group     BP 01/26/24 1302 (!) 169/74     Girls Systolic BP Percentile --      Girls Diastolic BP Percentile --      Boys Systolic BP Percentile --      Boys Diastolic BP Percentile --      Pulse Rate 01/26/24 1302 (!) 124     Resp 01/26/24 1302 (!) 25     Temp 01/26/24 1302 100.2 F (37.9 C)     Temp Source 01/26/24 1302 Oral     SpO2 01/26/24 1253 97 %     Weight 01/26/24 1256 (!) 301 lb (136.5 kg)     Height 01/26/24 1256 5' 4 (1.626 m)     Head Circumference --      Peak Flow --      Pain Score 01/26/24 1256 0     Pain Loc --      Pain Education --      Exclude from Growth Chart --     Most recent vital signs: Vitals:   01/26/24 1256 01/26/24 1302  BP:  (!) 169/74  Pulse:  (!) 124  Resp:  (!) 25  Temp:  100.2 F (37.9 C)  SpO2: 97% 100%     General: Awake, no distress.  CV:  Good peripheral perfusion.  Resp:  Normal effort.  On 4 L Abd:  No distention.  Soft and  nontender Other:  Lymphedema noted with well-perfused feet.  However redness and warmth noted more to the left leg.   ED Results / Procedures / Treatments   Labs (all labs ordered are listed, but only abnormal results are displayed) Labs Reviewed  CBC WITH DIFFERENTIAL/PLATELET - Abnormal; Notable for the following components:      Result Value   RBC 3.76 (*)    Hemoglobin 10.6 (*)    HCT 35.0 (*)    Neutro Abs 7.9 (*)    Lymphs Abs 0.4 (*)    All other components within normal limits  RESP PANEL BY RT-PCR (RSV, FLU A&B, COVID)  RVPGX2  CULTURE, BLOOD (ROUTINE X 2)  CULTURE, BLOOD (ROUTINE X 2)  LACTIC ACID, PLASMA  LACTIC ACID, PLASMA  COMPREHENSIVE METABOLIC PANEL WITH GFR  PROTIME-INR  URINALYSIS, W/ REFLEX TO CULTURE (INFECTION SUSPECTED)  PRO BRAIN NATRIURETIC PEPTIDE     EKG  My interpretation of EKG:  Sinus tachycardia rate of  127 without any ST elevation or T wave inversions, normal intervals  SVT with a rate of 160 without any ST elevation or T wave inversions, normal intervals  RADIOLOGY I have reviewed the xray personally and interpreted no pneumonia   PROCEDURES:  Critical Care performed: Yes, see critical care procedure note(s)  .1-3 Lead EKG Interpretation  Performed by: Ernest Ronal BRAVO, MD Authorized by: Ernest Ronal BRAVO, MD     Interpretation: abnormal     ECG rate:  120   ECG rate assessment: tachycardic     Rhythm: sinus tachycardia     Ectopy: none     Conduction: normal   Comments:     Episodes of SVT  .Critical Care  Performed by: Ernest Ronal BRAVO, MD Authorized by: Ernest Ronal BRAVO, MD   Critical care provider statement:    Critical care time (minutes):  30   Critical care was necessary to treat or prevent imminent or life-threatening deterioration of the following conditions:  Sepsis   Critical care was time spent personally by me on the following activities:  Development of treatment plan with patient or surrogate, discussions with  consultants, evaluation of patient's response to treatment, examination of patient, ordering and review of laboratory studies, ordering and review of radiographic studies, ordering and performing treatments and interventions, pulse oximetry, re-evaluation of patient's condition and review of old charts    MEDICATIONS ORDERED IN ED: Medications  metroNIDAZOLE  (FLAGYL ) IVPB 500 mg (has no administration in time range)  vancomycin  (VANCOCIN ) IVPB 1000 mg/200 mL premix (has no administration in time range)  ceFEPIme  (MAXIPIME ) 2 g in sodium chloride  0.9 % 100 mL IVPB (2 g Intravenous New Bag/Given 01/26/24 1310)  sodium chloride  0.9 % bolus 1,000 mL (1,000 mLs Intravenous New Bag/Given 01/26/24 1310)     IMPRESSION / MDM / ASSESSMENT AND PLAN / ED COURSE  I reviewed the triage vital signs and the nursing notes.   Patient's presentation is most consistent with acute presentation with potential threat to life or bodily function.   Patient comes in with sepsis.  Elevated heart rate elevated temperature already given Tylenol  she has a history of CHF so started off with 1 L of fluids given tachycardia.  Due to the patient's history of heart failure, will defer fluid administration at 30 cc/kg. Will start broad-spectrum antibiotics but I suspect this is more likely related to left leg infection.  She denies any urinary symptoms or flank pain to suggest kidney stone.  Abdomen soft and nontender.  Patient did go into the episode of SVT but broke on her own after about 5 minutes therefore I will hold off on giving her any beta-blockers at this time.  Will try to fluid resuscitate her and treat fever first.  INR is reassuring  Patient did have another episode of the SVT which I did try 2.5 of metoprolol  given her soft blood pressures.    Patient continues to go in and out of SVT.  Do not feel like adenosine  is warranted given she would just go right back in to SVT.  In as she continues to break out of  it on her own and her blood pressures are stable while in it.  She is continue being treated for sepsis with fluid resuscitation and IV antibiotics.  Patient CMP is still pending once this is back patient could be admitted to the hospitalist for further workup   Patient's heart rate now continues to be in the SVT for a little more  prolonged than the other episodes and have been more intermittent in nature her blood pressures are stable stable and I reevaluated her and she has no symptoms from it.  Will try sending a little bit longer acting and give her some oral metoprolol  to hopefully help her and her heart rates down.    The patient is on the cardiac monitor to evaluate for evidence of arrhythmia and/or significant heart rate changes.      FINAL CLINICAL IMPRESSION(S) / ED DIAGNOSES   Final diagnoses:  Sepsis, due to unspecified organism, unspecified whether acute organ dysfunction present (HCC)  SVT (supraventricular tachycardia)  Cellulitis of left lower extremity     Rx / DC Orders   ED Discharge Orders     None        Note:  This document was prepared using Dragon voice recognition software and may include unintentional dictation errors.   Ernest Ronal BRAVO, MD 01/26/24 1515    Ernest Ronal BRAVO, MD 01/26/24 1538    Ernest Ronal BRAVO, MD 02/22/24 [phone]

## 2024-01-26 NOTE — ED Triage Notes (Signed)
 Pt BIB EMS for fever and chills. Per EMS pt has a known infection in her legs bilaterally dx yesterday by tele-visit. Pt was prescribed abx however not started. PT Febrile 103.6 and EMS gave 1G IV Tylenol . Pt alert and oriented at triage. Pt wears O2 4L [place] at home. Hx of COPD.

## 2024-01-26 NOTE — ED Notes (Signed)
 CCMD notified of cardiac monitoring order.

## 2024-01-26 NOTE — Consult Note (Signed)
 CODE SEPSIS - PHARMACY COMMUNICATION  **Broad Spectrum Antibiotics should be administered within 1 hour of Sepsis diagnosis**  Time Code Sepsis Called/Page Received: 1256  Antibiotics Ordered: cefepime 2G x1, vancomycin 1G x1, metronidazole 500mg  x1   Time of 1st antibiotic administration: 1310  Additional action taken by pharmacy: none  If necessary, Name of Provider/Nurse Contacted: n/a    Annabella LOISE Banks ,PharmD Clinical Pharmacist  01/26/2024  12:59 PM

## 2024-01-26 NOTE — H&P (Incomplete)
 History and Physical    Kelly Hogan FMW:[id number] DOB: Jun 25, 1940 DOA: 01/26/2024  DOS: the patient was seen and examined on 01/26/2024  PCP: [name] [name] LABOR, MD   Patient coming from: Home  I have personally briefly reviewed patient's old medical records in [facility] Link and CareEverywhere  HPI:   Kelly Hogan is a 83 y.o. year old female with medical history of hypertension, hyperlipidemia, type 2 diabetes, class III obesity, OHS, COPD and history of venous insufficiency presented to the ED as she was seen by telehealth and they were concerned about her left leg having an infection.   Pt states she wears oxygen 4 L day and night. She developed chills yesterdays. She has pain in her left leg but no coughing, sore throat, no dysuria or other infectious symptoms.    On arrival to the ED patient was noted to be HDS stable.  Lab work and imaging obtained.  CBC without leukocytosis.  BMP with elevated creatinine at 2 but otherwise unremarkable with no electrolyte abnormalities.  BNP normal.  TSH elevated.  Urinalysis without signs of infection.  Chest x-ray normal.  Given hypotension and cellulitis, TRH contacted for admission.  Review of Systems: As mentioned in the history of present illness. All other systems reviewed and are negative.   Past Medical History:  Diagnosis Date   Asthma    COPD (chronic obstructive pulmonary disease) (HCC)    Diabetes mellitus without complication (HCC)    Hypertension    Morbid obesity with BMI of 50.0-59.9, adult (HCC) 07/13/2019   Stomach ulcer     No past surgical history on file.   Allergies  Allergen Reactions   Enalapril Swelling    angioedema   Oxycodone Shortness Of Breath   Oxycodone-Acetaminophen  Shortness Of Breath   Ranitidine Hcl Swelling   Aspirin     Hydrochlorothiazide Rash   Penicillins Rash    Family History  Problem Relation Age of Onset   Hypertension Sister    Diabetes Mellitus II Brother      Prior to Admission medications   Medication Sig Start Date End Date Taking? Authorizing Provider  acetaminophen  (TYLENOL ) 500 MG tablet Take 500-1,000 mg by mouth every 6 (six) hours as needed for mild pain or fever.    Yes [provider]  albuterol  (PROVENTIL ) (2.5 MG/3ML) 0.083% nebulizer solution Take 3 mLs by nebulization every 6 (six) hours as needed for wheezing.   Yes [provider]  aspirin  81 MG EC tablet Take 81 mg by mouth daily.   Yes [provider]  carvedilol  (COREG ) 25 MG tablet Take 12.5 mg by mouth in the morning and at bedtime. 10/22/18  Yes [provider]  Cholecalciferol  25 MCG (1000 UT) tablet Take 1,000 Units by mouth daily. 04/22/11  Yes [provider]  ciclopirox (LOPROX) 0.77 % cream Apply 0.77 % topically 2 (two) times daily.   Yes [provider]  diclofenac Sodium (VOLTAREN) 1 % GEL Apply 2 g topically 4 (four) times daily. 10/13/20  Yes [provider]  docusate sodium  (COLACE) 100 MG capsule Take 100 mg by mouth 2 (two) times daily. 04/06/16  Yes [provider]  empagliflozin (JARDIANCE) 10 MG TABS tablet Take 1 tablet by mouth daily. 04/26/20  Yes [provider]  fluticasone  (FLONASE ) 50 MCG/ACT nasal spray Place 1 spray into both nostrils daily.    Yes [provider]  gatifloxacin (ZYMAXID) 0.5 % SOLN Place 1 drop into the left eye 4 (four)  times daily. 11/28/23  Yes [provider]  ketorolac (ACULAR) 0.5 % ophthalmic solution Place 1 drop into the left eye 2 (two) times daily. 10/28/23  Yes [provider]  Lidocaine 4 % PTCH Place onto the skin. 10/22/20  Yes [provider]  olmesartan (BENICAR) 5 MG tablet Take 5 mg by mouth daily.   Yes [provider]  omeprazole (PRILOSEC) 20 MG capsule Take 20 mg by mouth daily.   Yes [provider]  Oyster Shell 500 MG TABS Take 500 mg by mouth in the morning, at noon, and at bedtime.    Yes [provider]  pravastatin (PRAVACHOL) 20 MG tablet Take 20 mg by mouth daily.   Yes [provider]  prednisoLONE acetate (PRED FORTE) 1 % ophthalmic suspension Place 1 drop into the left eye 4 (four) times daily. 10/28/23  Yes [provider]  PROAIR  HFA 108 (90 Base) MCG/ACT inhaler Inhale 2 puffs into the lungs every 4 (four) hours as needed for wheezing or shortness of breath.    Yes [provider]  senna (SENOKOT) 8.6 MG tablet Take 1 tablet by mouth daily.   Yes [provider]  Torsemide 40 MG TABS Take 40 mg by mouth 2 (two) times daily. 10/20/20  Yes [provider]  TRELEGY ELLIPTA 100-62.5-25 MCG/INH AEPB Take 1 puff by mouth daily. 06/09/19  Yes [provider]  amLODipine (NORVASC) 5 MG tablet Take 5 mg by mouth daily. Patient not taking: Reported on 01/26/2024 09/24/20   [provider]  azithromycin (ZITHROMAX) 250 MG tablet Take 250 mg by mouth daily. Patient not taking: Reported on 01/26/2024 03/19/18   [provider]  ferrous sulfate 324 MG TBEC Take 324 mg by mouth.    [provider]  furosemide  (LASIX ) 40 MG tablet Take 40-80 mg by mouth as directed.  Patient not taking: Reported on 01/26/2024    [provider]  hydrALAZINE  (APRESOLINE ) 25 MG tablet Take 25 mg by mouth 2 (two) times daily. Patient not taking: Reported on 01/26/2024 06/28/19   [provider]  ipratropium-albuterol  (DUONEB) 0.5-2.5 (3) MG/3ML SOLN Take 3 mLs by nebulization every 6 (six) hours as needed (wheezing). Patient not taking: Reported on 01/26/2024    [provider]  omeprazole (PRILOSEC) 20 MG capsule Take by mouth. 09/25/20 09/14/23  [provider]  simvastatin  (ZOCOR ) 10 MG tablet Take 10 mg by mouth at bedtime. Patient not taking: Reported on 01/26/2024    [provider]  spironolactone  (ALDACTONE ) 25 MG tablet Take 25 mg by mouth daily. Patient not taking:  Reported on 01/26/2024 10/17/17   [provider]  telmisartan (MICARDIS) 80 MG tablet Take 80 mg by mouth daily. Patient not taking: Reported on 01/26/2024 05/21/18   [provider]    Social History:  reports that she has quit smoking. Her smoking use included cigarettes. She has never used smokeless tobacco. She reports that she does not drink alcohol and does not use drugs. Lives by herself Tobacco- Denies current use. EtOH- Denies use.  Illicit drug use- denies use.  IADLs/ADLs- can perform independently at baseline    Physical Exam: Vitals:   01/26/24 1900 01/26/24 2000 01/26/24 2021 01/26/24 2301  BP: 94/82 (!) 130/44    Pulse: 83 87    Resp: 16 17    Temp:   98.9 F (37.2 C) (!) 100.5 F (38.1 C)  TempSrc:   Oral Oral  SpO2: 100% 100%  Weight:      Height:         Gen: chronically ill appearing female in NAD HENT: Dry MM CV: sinus rhythm, good radial pulse Resp: CTAB Abd: No TTP, normal bowel sounds MSK: left extremity >right extremity, erythema of left extremity  Neuro: alert and oriented x4 Psych: pleasant mood   Labs on Admission: I have personally reviewed following labs and imaging studies  CBC: Recent Labs  Lab 01/26/24 1256  WBC 8.5  NEUTROABS 7.9*  HGB 10.6*  HCT 35.0*  MCV 93.1  PLT 325   Basic Metabolic Panel: Recent Labs  Lab 01/26/24 1400  NA 139  K 4.7  CL 102  CO2 24  GLUCOSE 133*  BUN 54*  CREATININE 2.03*  CALCIUM  9.1  MG 2.2   GFR: Estimated Creatinine Clearance: 29.5 mL/min (A) (by C-G formula based on SCr of 2.03 mg/dL (H)). Liver Function Tests: Recent Labs  Lab 01/26/24 1400  AST 16  ALT 8  ALKPHOS 45  BILITOT 0.4  PROT 6.8  ALBUMIN 3.8   No results for input(s): LIPASE, AMYLASE in the last 168 hours. No results for input(s): AMMONIA in the last 168 hours. Coagulation Profile: Recent Labs  Lab 01/26/24 1256  INR 1.0   Cardiac Enzymes: No results for input(s): CKTOTAL, CKMB,  CKMBINDEX, TROPONINI, TROPONINIHS in the last 168 hours. BNP (last 3 results) No results for input(s): BNP in the last 8760 hours. HbA1C: No results for input(s): HGBA1C in the last 72 hours. CBG: No results for input(s): GLUCAP in the last 168 hours. Lipid Profile: No results for input(s): CHOL, HDL, LDLCALC, TRIG, CHOLHDL, LDLDIRECT in the last 72 hours. Thyroid Function Tests: Recent Labs    01/26/24 1256  TSH 5.650*   Anemia Panel: No results for input(s): VITAMINB12, FOLATE, FERRITIN, TIBC, IRON, RETICCTPCT in the last 72 hours. Urine analysis:    Component Value Date/Time   COLORURINE STRAW (A) 01/26/2024 1430   APPEARANCEUR CLEAR (A) 01/26/2024 1430   APPEARANCEUR Cloudy 10/14/2012 2100   LABSPEC 1.009 01/26/2024 1430   LABSPEC 1.016 10/14/2012 2100   PHURINE 5.0 01/26/2024 1430   GLUCOSEU 150 (A) 01/26/2024 1430   GLUCOSEU Negative 10/14/2012 2100   HGBUR NEGATIVE 01/26/2024 1430   BILIRUBINUR NEGATIVE 01/26/2024 1430   BILIRUBINUR Negative 10/14/2012 2100   KETONESUR NEGATIVE 01/26/2024 1430   PROTEINUR 100 (A) 01/26/2024 1430   NITRITE NEGATIVE 01/26/2024 1430   LEUKOCYTESUR NEGATIVE 01/26/2024 1430   LEUKOCYTESUR Negative 10/14/2012 2100    Radiological Exams on Admission: I have personally reviewed images DG Chest Port 1 View Result Date: 01/26/2024 CLINICAL DATA:  Questionable sepsis EXAM: PORTABLE CHEST 1 VIEW COMPARISON:  Chest radiograph dated 07/07/2019. FINDINGS: No focal consolidation, pleural effusion or pneumothorax. Top-normal cardiac size. No acute osseous pathology. IMPRESSION: No active disease. Electronically Signed   By: Vanetta Chou M.D.   On: 01/26/2024 13:58    EKG: My personal interpretation of EKG shows: Pending    Assessment/Plan Principal Problem:   Sepsis due to cellulitis [facility]) Active Problems:   Essential (primary) hypertension   Type 2 diabetes mellitus (HCC)   Chronic venous  insufficiency   HLD (hyperlipidemia)   Morbid obesity with BMI of 50.0-59.9, adult (HCC)   OSA (obstructive sleep apnea)   Obesity hypoventilation syndrome (HCC)   (HFpEF) heart failure with preserved ejection fraction (HCC)   Patient with left extremity pain and erythema with complaint of chills found to have cellulitis of the left lower extremity.  She  received metronidazole and cefepime in the ED.  Blood cultures obtained.  Will start IV vancomycin given cellulitis.  Monitor cultures, trend leukocyte count and fever curve.  There is some tenderness to palpation of the left lower extremity and it is bigger in size than the right lower extremity will get Doppler as well.  Patient with semirecent admission for similar complaint.  She received cefepime and Doxy there but will continue IV cefepime and vancomycin.  Doppler ordered given asymmetry and concern for DVT.  SVT: Patient noted to be in SVT with rates of 140s.  EDP discussed with cardiology who recommended metoprolol and if did not respond then start amiodarone.  Amiodarone started by EDP and patient with significantly improved rates.  I checked her TSH and it was elevated at 5.6 which does not explain her episode and magnesium was normal.  Likely mediated by sepsis.  This should be able to wean off tomorrow but if unable to then would consult cardiology.  I have not consulted cardiology.   AKI on CKD 3B: Likely secondary to above.  Getting IVF.  Trend.  Avoid nephrotoxic agents.  Renally dose medications.  Hypertension: Hypotensive secondary to infection above.  Responding to IV fluids so we will hold antihypertensives.  Type 2 diabetes: Monitor CGM as she may have low intake from infection.  Hyperlipidemia: Continue home statin  Chronic venous stasis: Patient has a follow-up to lymphedema clinic.  Advised to follow-up with them.  Chronic hypoxic respiratory failure secondary to COPD and OHS: Currently on home 4 L nasal cannula.  VTE  prophylaxis:  Lovenox   Diet: Heart healthy Code Status:  Full Code Telemetry:  Admission status: Inpatient, Progressive Patient is from: Home Anticipated d/c is to: Home Anticipated d/c is in: 3-4 days   Family Communication: Updated at bedside  Consults called: None   Severity of Illness: The appropriate patient status for this patient is INPATIENT. Inpatient status is judged to be reasonable and necessary in order to provide the required intensity of service to ensure the patient's safety. The patient's presenting symptoms, physical exam findings, and initial radiographic and laboratory data in the context of their chronic comorbidities is felt to place them at high risk for further clinical deterioration. Furthermore, it is not anticipated that the patient will be medically stable for discharge from the hospital within 2 midnights of admission.   * I certify that at the point of admission it is my clinical judgment that the patient will require inpatient hospital care spanning beyond 2 midnights from the point of admission due to high intensity of service, high risk for further deterioration and high frequency of surveillance required.DEWAINE Morene Bathe, MD Jolynn DEL. [facility]

## 2024-01-26 NOTE — ED Notes (Signed)
 Pt does not pass swallow screen

## 2024-01-26 NOTE — Sepsis Progress Note (Signed)
 Sepsis protocol monitored by eLink

## 2024-01-26 NOTE — ED Notes (Signed)
 HR spiking to 140-150's sustained for a few minutes and back to SR-ST post lopressor iv. MD Ernest notified of the same.

## 2024-01-27 ENCOUNTER — Inpatient Hospital Stay

## 2024-01-27 ENCOUNTER — Other Ambulatory Visit: Payer: Self-pay

## 2024-01-27 DIAGNOSIS — L0390 Cellulitis, unspecified: Secondary | ICD-10-CM | POA: Diagnosis not present

## 2024-01-27 DIAGNOSIS — A419 Sepsis, unspecified organism: Secondary | ICD-10-CM | POA: Diagnosis not present

## 2024-01-27 LAB — CBC
HCT: 29.4 % — ABNORMAL LOW (ref 36.0–46.0)
Hemoglobin: 8.7 g/dL — ABNORMAL LOW (ref 12.0–15.0)
MCH: 28.5 pg (ref 26.0–34.0)
MCHC: 29.6 g/dL — ABNORMAL LOW (ref 30.0–36.0)
MCV: 96.4 fL (ref 80.0–100.0)
Platelets: 249 K/uL (ref 150–400)
RBC: 3.05 MIL/uL — ABNORMAL LOW (ref 3.87–5.11)
RDW: 13.8 % (ref 11.5–15.5)
WBC: 24 K/uL — ABNORMAL HIGH (ref 4.0–10.5)
nRBC: 0 % (ref 0.0–0.2)

## 2024-01-27 LAB — BASIC METABOLIC PANEL WITH GFR
Anion gap: 11 (ref 5–15)
Anion gap: 12 (ref 5–15)
BUN: 58 mg/dL — ABNORMAL HIGH (ref 8–23)
BUN: 60 mg/dL — ABNORMAL HIGH (ref 8–23)
CO2: 27 mmol/L (ref 22–32)
CO2: 26 mmol/L (ref 22–32)
Calcium: 9.2 mg/dL (ref 8.9–10.3)
Calcium: 9.3 mg/dL (ref 8.9–10.3)
Chloride: 100 mmol/L (ref 98–111)
Chloride: 99 mmol/L (ref 98–111)
Creatinine, Ser: 2.59 mg/dL — ABNORMAL HIGH (ref 0.44–1.00)
Creatinine, Ser: 2.7 mg/dL — ABNORMAL HIGH (ref 0.44–1.00)
GFR, Estimated: 18 mL/min — ABNORMAL LOW (ref >60)
GFR, Estimated: 17 mL/min — ABNORMAL LOW (ref >60)
Glucose, Bld: 102 mg/dL — ABNORMAL HIGH (ref 70–99)
Glucose, Bld: 80 mg/dL (ref 70–99)
Potassium: 5.6 mmol/L — ABNORMAL HIGH (ref 3.5–5.1)
Potassium: 5.4 mmol/L — ABNORMAL HIGH (ref 3.5–5.1)
Sodium: 138 mmol/L (ref 135–145)
Sodium: 137 mmol/L (ref 135–145)

## 2024-01-27 LAB — BLOOD CULTURE ID PANEL (REFLEXED) - BCID2

## 2024-01-27 LAB — CBG MONITORING, ED
Glucose-Capillary: 93 mg/dL (ref 70–99)
Glucose-Capillary: 77 mg/dL (ref 70–99)
Glucose-Capillary: 81 mg/dL (ref 70–99)
Glucose-Capillary: 80 mg/dL (ref 70–99)

## 2024-01-27 LAB — BLOOD GAS, VENOUS
Acid-base deficit: 3.2 mmol/L — ABNORMAL HIGH (ref 0.0–2.0)
Acid-base deficit: 0.6 mmol/L (ref 0.0–2.0)
Bicarbonate: 27.4 mmol/L (ref 20.0–28.0)
Bicarbonate: 27.6 mmol/L (ref 20.0–28.0)
O2 Saturation: 67.9 %
O2 Saturation: 99.1 %
Patient temperature: 37
Patient temperature: 37
pCO2, Ven: 77 mmHg (ref 44–60)
pCO2, Ven: 60 mmHg (ref 44–60)
pH, Ven: 7.16 — CL (ref 7.25–7.43)
pH, Ven: 7.27 (ref 7.25–7.43)
pO2, Ven: 39 mmHg (ref 32–45)
pO2, Ven: 134 mmHg — ABNORMAL HIGH (ref 32–45)

## 2024-01-27 LAB — GLUCOSE, CAPILLARY: Glucose-Capillary: 100 mg/dL — ABNORMAL HIGH (ref 70–99)

## 2024-01-27 MED ORDER — SODIUM ZIRCONIUM CYCLOSILICATE 10 G PO PACK
10.0000 g | PACK | Freq: Once | ORAL | Status: DC
  Filled 2024-01-27: qty 1

## 2024-01-27 MED ORDER — MIDODRINE HCL 5 MG PO TABS
5.0000 mg | ORAL_TABLET | Freq: Three times a day (TID) | ORAL | Status: DC
  Administered 2024-01-27 – 2024-01-29 (×6): 5 mg via ORAL
  Filled 2024-01-27 (×6): qty 1

## 2024-01-27 MED ORDER — DEXTROSE 50 % IV SOLN
1.0000 | Freq: Once | INTRAVENOUS | Status: AC
  Administered 2024-01-27: 50 mL via INTRAVENOUS
  Filled 2024-01-27: qty 50

## 2024-01-27 MED ORDER — VANCOMYCIN HCL 2000 MG/400ML IV SOLN
2000.0000 mg | Freq: Once | INTRAVENOUS | Status: AC
  Administered 2024-01-27: 2000 mg via INTRAVENOUS
  Filled 2024-01-27: qty 400

## 2024-01-27 MED ORDER — ASPIRIN 81 MG PO TBEC
81.0000 mg | DELAYED_RELEASE_TABLET | Freq: Every day | ORAL | Status: DC
  Administered 2024-01-27: 81 mg via ORAL
  Filled 2024-01-27: qty 1

## 2024-01-27 MED ORDER — CEFAZOLIN SODIUM-DEXTROSE 2-4 GM/100ML-% IV SOLN: 2.0000 g | Freq: Three times a day (TID) | INTRAVENOUS | Status: DC

## 2024-01-27 MED ORDER — ENOXAPARIN SODIUM 30 MG/0.3ML IJ SOSY
30.0000 mg | PREFILLED_SYRINGE | INTRAMUSCULAR | Status: DC
  Administered 2024-01-27: 30 mg via SUBCUTANEOUS
  Filled 2024-01-27: qty 0.3

## 2024-01-27 MED ORDER — INFLUENZA VAC SPLIT HIGH-DOSE 0.5 ML IM SUSY
0.5000 mL | PREFILLED_SYRINGE | INTRAMUSCULAR | Status: AC
  Administered 2024-01-29: 0.5 mL via INTRAMUSCULAR
  Filled 2024-01-27: qty 0.5

## 2024-01-27 MED ORDER — SODIUM CHLORIDE 0.9 % IV SOLN
2.0000 g | INTRAVENOUS | Status: DC
  Administered 2024-01-27 – 2024-01-28 (×2): 2 g via INTRAVENOUS
  Filled 2024-01-27 (×2): qty 12.5

## 2024-01-27 MED ORDER — PRAVASTATIN SODIUM 20 MG PO TABS
20.0000 mg | ORAL_TABLET | Freq: Every evening | ORAL | Status: DC
  Administered 2024-01-28 – 2024-02-07 (×11): 20 mg via ORAL
  Filled 2024-01-27 (×11): qty 1

## 2024-01-27 MED ORDER — PANTOPRAZOLE SODIUM 40 MG PO TBEC
40.0000 mg | DELAYED_RELEASE_TABLET | Freq: Every day | ORAL | Status: DC
  Administered 2024-01-27 – 2024-02-08 (×12): 40 mg via ORAL
  Filled 2024-01-27 (×12): qty 1

## 2024-01-27 MED ORDER — INSULIN ASPART 100 UNIT/ML IJ SOLN
10.0000 [IU] | Freq: Once | INTRAMUSCULAR | Status: AC
  Administered 2024-01-27: 10 [IU] via SUBCUTANEOUS
  Filled 2024-01-27: qty 10

## 2024-01-27 MED ORDER — VANCOMYCIN HCL IN DEXTROSE 1-5 GM/200ML-% IV SOLN: 1000.0000 mg | INTRAVENOUS | Status: DC

## 2024-01-27 MED ORDER — BUDESON-GLYCOPYRROL-FORMOTEROL 160-9-4.8 MCG/ACT IN AERO
2.0000 | INHALATION_SPRAY | Freq: Two times a day (BID) | RESPIRATORY_TRACT | Status: DC
  Administered 2024-01-27 – 2024-02-08 (×18): 2 via RESPIRATORY_TRACT
  Filled 2024-01-27 (×3): qty 5.9

## 2024-01-27 MED ORDER — LACTATED RINGERS IV SOLN: INTRAVENOUS | Status: DC

## 2024-01-27 MED ORDER — SODIUM BICARBONATE 8.4 % IV SOLN
INTRAVENOUS | Status: AC
  Filled 2024-01-27: qty 150

## 2024-01-27 MED ORDER — LINEZOLID 600 MG/300ML IV SOLN
600.0000 mg | Freq: Two times a day (BID) | INTRAVENOUS | Status: DC
  Administered 2024-01-27 – 2024-01-28 (×2): 600 mg via INTRAVENOUS
  Filled 2024-01-27 (×4): qty 300

## 2024-01-27 NOTE — Progress Notes (Addendum)
 Pharmacy Antibiotic Note  Kelly Hogan is a 83 y.o. female admitted on 01/26/2024 with cellulitis.  Pharmacy has been consulted for Cefepime and Vancomycin dosing for 7 days.  Plan: Cefepime 2 gm q24hr per indication & renal fxn.  Pt given Vancomycin 1000 mg once on 11/11 @ 1353. Give additional 2000 mg once. Vancomycin 1000 mg IV Q 36 hrs. Goal AUC 400-550. Expected AUC: 495.5 SCr used: 2.03, Vd used: 0.5, BMI: 51.4  Follow up culture results to assess for antibiotic optimization. Monitor renal function to assess for any necessary antibiotic dosing changes. Pharmacy will continue to follow and will adjust abx dosing whenever warranted.  Temp (24hrs), Avg:99.6 F (37.6 C), Min:98.9 F (37.2 C), Max:100.5 F (38.1 C)   Recent Labs  Lab 01/26/24 1256 01/26/24 1400  WBC 8.5  --   CREATININE  --  2.03*  LATICACIDVEN 1.5  --     Estimated Creatinine Clearance: 29.5 mL/min (A) (by C-G formula based on SCr of 2.03 mg/dL (H)).    Allergies  Allergen Reactions   Enalapril Swelling    angioedema   Oxycodone Shortness Of Breath   Oxycodone-Acetaminophen  Shortness Of Breath   Ranitidine Hcl Swelling   Aspirin     Hydrochlorothiazide Rash   Penicillins Rash    Antimicrobials this admission: 11/11 Cefepime >> x 7 days 11/11 Vancomycin >> x 7 days 11/11 Flagyl >> x 1 dose  Microbiology results: 11/11 BCx: Pending  Thank you for allowing pharmacy to be a part of this patient's care.  Rankin CANDIE Dills, PharmD, [facility] 01/27/2024 2:28 AM

## 2024-01-27 NOTE — Hospital Course (Addendum)
 Hospital course / significant events:   HPI: Kelly Hogan is a 83 y.o. 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    11/11: admitted to hospitalist w/ hypotension, cellulitis. Metronidazole and cefepime in the ED. SVT with rates of 140s, pt requiring amio gtt.   11/12: (+)Pseudomonas bacteremia - linezolid and vancomycin. Remains on amio gtt for now, cardiology will see in AM. Was off amio briefly this evening d/t limited IV access and needed other Rx, HR was WNL but then up again and back on the drip. Renal fxn worse, resp acidosis and hypercarbia, pt placed on BiPAP. Fluids changed to bicarb infusion 1L. Hyperkalemia, mild, gave K shifting Rx w/ insulin/D50, also admin Lokelma.  11/13: renal fxn continuing to worsen - Nephrology consulted. Pt remains on BiPAP this morning. Was off briefly per her request but O2 down and SOB so placed back on the BiPAP. BP soft, gave another 1L fluids. Cardiology - transition to po amio, starting eliquis. ID recs change ceftazidime d/t myoclonus effects and confusion. Still worsening into the afternoon, CXR and BNP indicative of HFpEF w/ pulmonary edema despite fairly clear lungs (but exam difficult). Son is ok for trial of diuretics despite renal fxn.  11/14: good UOP and respiratory is improved onto [place] O2, renal fxn about same. Continuing on ceftazidime. Changed morphine to dilaudid w/ renal fxn. Renal US  no concerns. Cr 2.86. BCx repeated. Requiring BiPAP at night  11/15: somnolent but alerts to voice. Has not wanted to sit up in bed/chair. Cr 7.9. UOP yesterday looks like under documented. Continue diuresis. Repeat BCx NG thus far.      Consultants:  Cardiology  Infectious disease    Procedures/Surgeries: none      ASSESSMENT & PLAN:   Cellulitis of the left lower extremity. Present on admission and ruled out DVT (though US  exam somewhat limited) (+)Pseudomonas bacteremia Dc cefepime d/t myoclonus / neuro effects. Continue on ceftazidime.  ID following  Monitor cultures --> initial cultures 11/11 sensitive to ceftazidime, repeat cultures 11/14 are negative thus far  trend leukocyte count and fever curve.   ID consult for bacteremia - likely cellulitis but eval lungs as well may need CT    HFpEF Exacerbation d/t SVT + fluid tx per sepsis protocol  Echo noted preserved EF but unable to assess diastolic fxn, likely is dysfunctional. No significant valve dysfunction.  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   Midodrine dc today hold antihypertensives. Cardiology following    Type 2 diabetes Monitor glc Relatively low  glc, will hold on SSI for now   Hyperlipidemia:  statin   Chronic venous stasis Rec follow w/ lymphedema clinic   Skin nodularity bilateral lower extremities Question hyperkeratotic lesions? Keloid type lesions?  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  Underweight - under 18  overweight - 25 to 29 obese - 30 or more Class 1 obesity: BMI of 30.0 to 34 Class 2 obesity: BMI of 35.0 to 39 Class 3 obesity: BMI of 40.0 to 49 Super Morbid Obesity: BMI 50-59 Super-super Morbid Obesity: BMI 60+ Healthy nutrition and physical activity advised as adjunct to other disease management and risk reduction treatments    DVT prophylaxis: lovenox  IV fluids:dc Nutrition: dysphagia diet for now  Unumprovident / other devices: none  Code Status: DNR per MOST form which was completed by the patient  ACP documentation reviewed: none on file in [facility]  [facility]  Medical barriers to dispo: IV abx and bacteremia. Expected medical readiness for discharge several days at least.

## 2024-01-27 NOTE — Consult Note (Addendum)
 WOC Nurse Consult Note: patient not familiar to WOC team; no notes found in EMR regarding follow-up with vascular or lymphedema clinic; notes from [facility] mention venous insufficiency per MD note patient has upcoming appointment at lymphedema clinic   Reason for Consult: lower legs  Wound type: patient with what appears to be papillomatosis nodules r/t lymphedema;  scattered areas of dark hemorrhagic tissue noted to L lower anterior leg  Pressure Injury POA: NA not pressure  Measurement: widespread to B lower legs  Wound bed: as above  Drainage (amount, consistency, odor) appear dry; see nursing flowsheet  Periwound: changes consistent with chronic venous insufficiency and likely lymphedema  Dressing procedure/placement/frequency: Cleanse B lower legs with soap and water, dry and apply Xeroform gauze to dorsal feet, anterior and posterior legs.  Cover with ABD pads and secure with Kerlix roll gauze starting just above toes and ending right below knees.  Apply Ace bandage in same fashion as Kerlix for light compression.   POC discussed with primary nurse.  Will include resources for lymphedema if patient is not followed by a specialist. Lymphedema is out of the scope of practice for the [facility] nurse.   WOC team will not follow. Re-consult if further needs arise.   Thank you,    Powell Bar MSN, RN-BC, CWOCN  Lymphedema  Resources (updated 01/2021) Each site requires a referral from your primary care MD [facility] [address] [place], [place]  [phone] (Upper extremities)  [address] [place], [place] [phone] (Lower extremities, PATIENT CAN NOT HAVE A WOUND)  [facility] [address] [place], [place] [address] [phone]  [facility] [address], [address] Medical Office [address]  [place], [place] [phone]  [facility] [address] [place], [place] [address] [phone]  [facility]  (only treatment for lymphedema related to cancer diagnosis) [address]  [place], [place] [address] [phone]    [facility] [address] [place], [place] [address] [phone] [facility] [phone] [address] [place], [place] [address] [phone]

## 2024-01-27 NOTE — ED Notes (Signed)
 Pt transferred to hospital bed, peri care performed, and gown changed. RT in room and placed pt on CPAP.

## 2024-01-27 NOTE — Progress Notes (Signed)
-----------------------------------------------------------  [facility]--------------------------------------------------- --------------------------------------------------------D(Data) A(Action) R(response) Note------------------------------------------------  Patient Name: Kelly Hogan Patient DOB: 08/22/1940 Date: @TODAY @      Data: Looking at down grade, pt with amio drip.    Action: non taken at this time.      Response:       Sharolyn Batman, RN [facility]

## 2024-01-27 NOTE — ED Notes (Signed)
 RT called for bipap

## 2024-01-27 NOTE — Progress Notes (Addendum)
 Patient arrived from ER. Placed back on continuous bipap. Amiodarone gtt running at this time. Pt placed on Monitor [id number]. Pt arrived from ER with Pink MOST form. Notified Provider of same. See new orders. Pt's legs wrapped per orders.

## 2024-01-27 NOTE — ED Notes (Signed)
 Dr Marsa notified of pH 7.16 CO2 77, orders for bipap

## 2024-01-27 NOTE — Progress Notes (Signed)
 VBG was 7.16 and a C02 of 77.  Pt was transferred to the floor and placed on a servo PS on the documented settings.

## 2024-01-27 NOTE — Progress Notes (Addendum)
 PROGRESS NOTE    Kelly Hogan   FMW:[id number] DOB: 23-Apr-1940  DOA: 01/26/2024 Date of Service: 01/27/24 which is hospital day 1  PCP: [name] [name] LABOR, MD    Hospital course / significant events:   HPI: Kelly Hogan is a 83 y.o. 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    11/11: admitted to hospitalist w/ hypotension, cellulitis. metronidazole and cefepime in the ED. SVT with rates of 140s, EDP discussed with cardiology Dr Ammon who recommended metoprolol and if did not respond then start amiodarone, pt requiring amio gtt.   11/12: (+)Pseudomonas bacteremia - linezolid and vancomycin. Improved rate but remains on amio gtt for now, d/w cardiology they will see in AM.      Consultants:  Cardiology  Infectious disease   Procedures/Surgeries: none      ASSESSMENT & PLAN:   Cellulitis of the left lower extremity. Present on admission and ruled out DVT (though US  exam somewhat limited) (+)Pseudomonas bacteremia Linezolid and vancomycin Monitor cultures, trend leukocyte count and fever curve.   ID consult for bacteremia    SVT:  Amiodarone started by EDP and patient with significantly improved rates --> remains on amio now Cardiology to follow - will see in AM Echo pending      AKI on CKD 3B Hyperkalemia  Caution w/ IVF given edema Monitor BMP again this afternoon - if K remains high / Cr worse may need nephrology    Hx Essential Hypertension Hypotensive secondary to infection above.   Responding to IV fluids and amio controlling HR hold antihypertensives.   Type 2 diabetes Monitor glc Relatively low glc, will hold on SSI for now   Hyperlipidemia:  statin   Chronic venous stasis Patient has a  follow-up to lymphedema clinic.  Advised to follow-up with them.   Chronic hypoxic respiratory failure secondary to COPD and OHS:  on home 4 L nasal cannula which is her baseline  Skin nodularity bilateral lower extremities Question hyperkeratotic lesions? Keloid type lesions?  See photos Appear benign but will complicate hygiene - rec clean w/ lukewarm water and mild soap and rinse thoroughly for now, wound care consult    Super Morbid Obesity based on BMI: Body mass index is 51.67 kg/m.SABRA Significantly low or high BMI is associated with higher medical risk.  Underweight - under 18  overweight - 25 to 29 obese - 30 or more Class 1 obesity: BMI of 30.0 to 34 Class 2 obesity: BMI of 35.0 to 39 Class 3 obesity: BMI of 40.0 to 49 Super Morbid Obesity: BMI 50-59 Super-super Morbid Obesity: BMI 60+ Healthy nutrition and physical activity advised as adjunct to other disease management and risk reduction treatments    DVT prophylaxis: lovenox  IV fluids: reduced rate continuous IV fluids  Nutrition: carb  Central lines / other devices: none  Code Status: FULL CODE ACP documentation reviewed: none on file in [facility]  TOC needs: TBD Medical barriers to dispo: IV abx and bacteremia. Expected medical readiness for discharge several days at least.              Subjective / Brief ROS:  Patient reports feeling okay this morning Denies CP/SOB.  Pain controlled.  Denies new weakness.  Tolerating diet.  Reports no  concerns w/ urination/defecation.   Family Communication: call to son Darold to give update - 01/27/24 4:50 PM     Objective Findings:  Vitals:   01/27/24 1437 01/27/24 1530 01/27/24 1600 01/27/24 1630  BP:  (!) 107/57 105/65 116/68  Pulse:  77 78 79  Resp:  17 18 20   Temp: 98.1 F (36.7 C)     TempSrc:      SpO2:  98% 98% 99%  Weight:      Height:        Intake/Output Summary (Last 24 hours) at 01/27/2024 1650 Last data filed at 01/27/2024 0309 Gross  per 24 hour  Intake 82.74 ml  Output 275 ml  Net -192.26 ml   Filed Weights   01/26/24 1256  Weight: (!) 136.5 kg    Examination:  Physical Exam Constitutional:      General: She is not in acute distress. Cardiovascular:     Rate and Rhythm: Normal rate and regular rhythm.  Pulmonary:     Effort: Pulmonary effort is normal.     Breath sounds: Normal breath sounds.  Musculoskeletal:     Right lower leg: Edema present.     Left lower leg: Edema present.  Skin:    Comments: See photos  Neurological:     Mental Status: She is alert and oriented to person, place, and time. Mental status is at baseline.  Psychiatric:        Mood and Affect: Mood normal.        Behavior: Behavior normal.       01/27/24 - erythema improved     01/26/24     Scheduled Medications:   aspirin  EC  81 mg Oral Daily   budesonide-glycopyrrolate-formoterol  2 puff Inhalation BID   enoxaparin  (LOVENOX ) injection  30 mg Subcutaneous Q24H   midodrine  5 mg Oral TID WC   pantoprazole   40 mg Oral Daily   pravastatin  20 mg Oral QPM   sodium chloride  flush  3 mL Intravenous Q12H    Continuous Infusions:  amiodarone 30 mg/hr (01/27/24 0818)   ceFEPime (MAXIPIME) IV Stopped (01/27/24 1232)   lactated ringers Stopped (01/27/24 1116)   linezolid (ZYVOX) IV      PRN Medications:  acetaminophen  **OR** acetaminophen , senna-docusate  Antimicrobials from admission:  Anti-infectives (From admission, onward)    Start     Dose/Rate Route Frequency Ordered Stop   01/28/24 1600  vancomycin (VANCOCIN) IVPB 1000 mg/200 mL premix  Status:  Discontinued       Placed in Followed by Linked Group   1,000 mg 200 mL/hr over 60 Minutes Intravenous Every 36 hours 01/27/24 0241 01/27/24 1045   01/27/24 2200  linezolid (ZYVOX) IVPB 600 mg        600 mg 300 mL/hr over 60 Minutes Intravenous Every 12 hours 01/27/24 1046     01/27/24 1200  ceFEPIme (MAXIPIME) 2 g in sodium chloride  0.9 % 100 mL IVPB        2  g 200 mL/hr over 30 Minutes Intravenous Every 24 hours 01/27/24 0228 02/02/24 1159   01/27/24 0400  vancomycin (VANCOREADY) IVPB 2000 mg/400 mL       Placed in Followed by Linked Group   2,000 mg 200 mL/hr over 120 Minutes Intravenous  Once 01/27/24 0241 01/27/24 0713   01/27/24 0100  ceFAZolin (ANCEF) IVPB 2g/100 mL premix  Status:  Discontinued        2 g 200 mL/hr over 30 Minutes Intravenous Every 8 hours  01/27/24 0037 01/27/24 0042   01/26/24 1300  ceFEPIme (MAXIPIME) 2 g in sodium chloride  0.9 % 100 mL IVPB        2 g 200 mL/hr over 30 Minutes Intravenous  Once 01/26/24 1256 01/26/24 1350   01/26/24 1300  metroNIDAZOLE (FLAGYL) IVPB 500 mg        500 mg 100 mL/hr over 60 Minutes Intravenous  Once 01/26/24 1256 01/26/24 1559   01/26/24 1300  vancomycin (VANCOCIN) IVPB 1000 mg/200 mL premix        1,000 mg 200 mL/hr over 60 Minutes Intravenous  Once 01/26/24 1256 01/26/24 1458           Data Reviewed:  I have personally reviewed the following...  CBC: Recent Labs  Lab 01/26/24 1256 01/27/24 0456  WBC 8.5 24.0*  NEUTROABS 7.9*  --   HGB 10.6* 8.7*  HCT 35.0* 29.4*  MCV 93.1 96.4  PLT 325 249   Basic Metabolic Panel: Recent Labs  Lab 01/26/24 1400 01/27/24 0456  NA 139 138  K 4.7 5.6*  CL 102 100  CO2 24 27  GLUCOSE 133* 102*  BUN 54* 58*  CREATININE 2.03* 2.59*  CALCIUM  9.1 9.2  MG 2.2  --    GFR: Estimated Creatinine Clearance: 23.1 mL/min (A) (by C-G formula based on SCr of 2.59 mg/dL (H)). Liver Function Tests: Recent Labs  Lab 01/26/24 1400  AST 16  ALT 8  ALKPHOS 45  BILITOT 0.4  PROT 6.8  ALBUMIN 3.8   No results for input(s): LIPASE, AMYLASE in the last 168 hours. No results for input(s): AMMONIA in the last 168 hours. Coagulation Profile: Recent Labs  Lab 01/26/24 1256  INR 1.0   Cardiac Enzymes: No results for input(s): CKTOTAL, CKMB, CKMBINDEX, TROPONINI in the last 168 hours. BNP (last 3 results) Recent Labs     01/26/24 1256  PROBNP 121.0   HbA1C: No results for input(s): HGBA1C in the last 72 hours. CBG: Recent Labs  Lab 01/27/24 0802 01/27/24 1129 01/27/24 1433 01/27/24 1610  GLUCAP 93 77 81 80   Lipid Profile: No results for input(s): CHOL, HDL, LDLCALC, TRIG, CHOLHDL, LDLDIRECT in the last 72 hours. Thyroid Function Tests: Recent Labs    01/26/24 1256  TSH 5.650*   Anemia Panel: No results for input(s): VITAMINB12, FOLATE, FERRITIN, TIBC, IRON, RETICCTPCT in the last 72 hours. Most Recent Urinalysis On File:     Component Value Date/Time   COLORURINE STRAW (A) 01/26/2024 1430   APPEARANCEUR CLEAR (A) 01/26/2024 1430   APPEARANCEUR Cloudy 10/14/2012 2100   LABSPEC 1.009 01/26/2024 1430   LABSPEC 1.016 10/14/2012 2100   PHURINE 5.0 01/26/2024 1430   GLUCOSEU 150 (A) 01/26/2024 1430   GLUCOSEU Negative 10/14/2012 2100   HGBUR NEGATIVE 01/26/2024 1430   BILIRUBINUR NEGATIVE 01/26/2024 1430   BILIRUBINUR Negative 10/14/2012 2100   KETONESUR NEGATIVE 01/26/2024 1430   PROTEINUR 100 (A) 01/26/2024 1430   NITRITE NEGATIVE 01/26/2024 1430   LEUKOCYTESUR NEGATIVE 01/26/2024 1430   LEUKOCYTESUR Negative 10/14/2012 2100   Sepsis Labs: @LABRCNTIP (procalcitonin:4,lacticidven:4) Microbiology: Recent Results (from the past 240 hours)  Resp panel by RT-PCR (RSV, Flu A&B, Covid) Anterior Nasal Swab     Status: None   Collection Time: 01/26/24 12:56 PM   Specimen: Anterior Nasal Swab  Result Value Ref Range Status   SARS Coronavirus 2 by RT PCR NEGATIVE NEGATIVE Final    Comment: (NOTE) SARS-CoV-2 target nucleic acids are NOT DETECTED.  The SARS-CoV-2 RNA is generally detectable in  upper respiratory specimens during the acute phase of infection. The lowest concentration of SARS-CoV-2 viral copies this assay can detect is 138 copies/mL. A negative result does not preclude SARS-Cov-2 infection and should not be used as the sole basis for treatment  or other patient management decisions. A negative result may occur with  improper specimen collection/handling, submission of specimen other than nasopharyngeal swab, presence of viral mutation(s) within the areas targeted by this assay, and inadequate number of viral copies(<138 copies/mL). A negative result must be combined with clinical observations, patient history, and epidemiological information. The expected result is Negative.  Fact Sheet for Patients:  bloggercourse.com  Fact Sheet for Healthcare Providers:  seriousbroker.it  This test is no t yet approved or cleared by the [place]  FDA and  has been authorized for detection and/or diagnosis of SARS-CoV-2 by FDA under an Emergency Use Authorization (EUA). This EUA will remain  in effect (meaning this test can be used) for the duration of the COVID-19 declaration under Section 564(b)(1) of the Act, 21 U.S.C.section 360bbb-3(b)(1), unless the authorization is terminated  or revoked sooner.       Influenza A by PCR NEGATIVE NEGATIVE Final   Influenza B by PCR NEGATIVE NEGATIVE Final    Comment: (NOTE) The Xpert Xpress SARS-CoV-2/FLU/RSV plus assay is intended as an aid in the diagnosis of influenza from Nasopharyngeal swab specimens and should not be used as a sole basis for treatment. Nasal washings and aspirates are unacceptable for Xpert Xpress SARS-CoV-2/FLU/RSV testing.  Fact Sheet for Patients: bloggercourse.com  Fact Sheet for Healthcare Providers: seriousbroker.it  This test is not yet approved or cleared by the [place]  FDA and has been authorized for detection and/or diagnosis of SARS-CoV-2 by FDA under an Emergency Use Authorization (EUA). This EUA will remain in effect (meaning this test can be used) for the duration of the COVID-19 declaration under Section 564(b)(1) of the Act, 21 U.S.C. section  360bbb-3(b)(1), unless the authorization is terminated or revoked.     Resp Syncytial Virus by PCR NEGATIVE NEGATIVE Final    Comment: (NOTE) Fact Sheet for Patients: bloggercourse.com  Fact Sheet for Healthcare Providers: seriousbroker.it  This test is not yet approved or cleared by the [place]  FDA and has been authorized for detection and/or diagnosis of SARS-CoV-2 by FDA under an Emergency Use Authorization (EUA). This EUA will remain in effect (meaning this test can be used) for the duration of the COVID-19 declaration under Section 564(b)(1) of the Act, 21 U.S.C. section 360bbb-3(b)(1), unless the authorization is terminated or revoked.  Performed at [facility], [address], [place], [place] [address]   Blood Culture (routine x 2)     Status: None (Preliminary result)   Collection Time: 01/26/24 12:56 PM   Specimen: BLOOD  Result Value Ref Range Status   Specimen Description   Final    BLOOD BLOOD RIGHT FOREARM Performed at [facility], [address] [place], [place] [address]    Special Requests   Final    BOTTLES DRAWN AEROBIC AND ANAEROBIC Blood Culture results may not be optimal due to an inadequate volume of blood received in culture bottles Performed at [facility], [address], [place], [place] [address]    Culture  Setup Time   Final    GRAM NEGATIVE RODS AEROBIC BOTTLE ONLY CRITICAL VALUE NOTED.  VALUE IS CONSISTENT WITH PREVIOUSLY REPORTED AND CALLED VALUE. Performed at [facility], [address], [place], [place] [address]  Culture   Final    CULTURE REINCUBATED FOR BETTER GROWTH Performed at [facility], [address], [place], [place] [address]    Report Status PENDING  Incomplete  Blood Culture (routine x 2)     Status: None (Preliminary result)   Collection Time: 01/26/24  1:01 PM   Specimen: BLOOD  Result Value Ref Range Status    Specimen Description   Final    BLOOD RIGHT ARM Performed at [facility], [address], [place], [place] [address]    Special Requests   Final    BOTTLES DRAWN AEROBIC AND ANAEROBIC Blood Culture adequate volume Performed at [facility], [address] [place], [place] [address]    Culture  Setup Time   Final    GRAM NEGATIVE RODS AEROBIC BOTTLE ONLY CRITICAL RESULT CALLED TO, READ BACK BY AND VERIFIED WITH:  NATHAN BELUE AT 9458 01/27/24 JG    Culture   Final    CULTURE REINCUBATED FOR BETTER GROWTH Performed at [facility], [address] [place], [place] [address]    Report Status PENDING  Incomplete  Blood Culture ID Panel (Reflexed)     Status: Abnormal   Collection Time: 01/26/24  1:01 PM  Result Value Ref Range Status   Enterococcus faecalis NOT DETECTED NOT DETECTED Final   Enterococcus Faecium NOT DETECTED NOT DETECTED Final   Listeria monocytogenes NOT DETECTED NOT DETECTED Final   Staphylococcus species NOT DETECTED NOT DETECTED Final   Staphylococcus aureus (BCID) NOT DETECTED NOT DETECTED Final   Staphylococcus epidermidis NOT DETECTED NOT DETECTED Final   Staphylococcus lugdunensis NOT DETECTED NOT DETECTED Final   Streptococcus species NOT DETECTED NOT DETECTED Final   Streptococcus agalactiae NOT DETECTED NOT DETECTED Final   Streptococcus pneumoniae NOT DETECTED NOT DETECTED Final   Streptococcus pyogenes NOT DETECTED NOT DETECTED Final   A.calcoaceticus-baumannii NOT DETECTED NOT DETECTED Final   Bacteroides fragilis NOT DETECTED NOT DETECTED Final   Enterobacterales NOT DETECTED NOT DETECTED Final   Enterobacter cloacae complex NOT DETECTED NOT DETECTED Final   Escherichia coli NOT DETECTED NOT DETECTED Final   Klebsiella aerogenes NOT DETECTED NOT DETECTED Final   Klebsiella oxytoca NOT DETECTED NOT DETECTED Final   Klebsiella pneumoniae NOT DETECTED NOT DETECTED Final   Proteus species NOT DETECTED NOT DETECTED Final    Salmonella species NOT DETECTED NOT DETECTED Final   Serratia marcescens NOT DETECTED NOT DETECTED Final   Haemophilus influenzae NOT DETECTED NOT DETECTED Final   Neisseria meningitidis NOT DETECTED NOT DETECTED Final   Pseudomonas aeruginosa DETECTED (A) NOT DETECTED Final    Comment: CRITICAL RESULT CALLED TO, READ BACK BY AND VERIFIED WITH:  NATHAN BELUE AT 0541 01/27/24 JG    Stenotrophomonas maltophilia NOT DETECTED NOT DETECTED Final   Candida albicans NOT DETECTED NOT DETECTED Final   Candida auris NOT DETECTED NOT DETECTED Final   Candida glabrata NOT DETECTED NOT DETECTED Final   Candida krusei NOT DETECTED NOT DETECTED Final   Candida parapsilosis NOT DETECTED NOT DETECTED Final   Candida tropicalis NOT DETECTED NOT DETECTED Final   Cryptococcus neoformans/gattii NOT DETECTED NOT DETECTED Final   CTX-M ESBL NOT DETECTED NOT DETECTED Final   Carbapenem resistance IMP NOT DETECTED NOT DETECTED Final   Carbapenem resistance KPC NOT DETECTED NOT DETECTED Final   Carbapenem resistance NDM NOT DETECTED NOT DETECTED Final   Carbapenem resistance VIM NOT DETECTED NOT DETECTED Final    Comment: Performed at [facility], [address] [place], [place] [address]      Radiology Studies last 3 days: US  Venous Img Lower Unilateral Left (DVT) Result Date: 01/27/2024 EXAM: ULTRASOUND DUPLEX OF THE LEFT LOWER EXTREMITY VEINS TECHNIQUE: Duplex ultrasound using B-mode/gray scaled imaging and Doppler spectral analysis and color flow was obtained of the deep venous structures of the left lower extremity. COMPARISON: None available. CLINICAL HISTORY: Swelling. FINDINGS: There is no evidence of deep venous thrombosis within the left lower extremity. The common femoral vein, femoral vein, and popliteal vein of the left lower extremity demonstrate normal compressibility with normal color flow and spectral analysis. There is limited visualization of the left posterior tibial veins and the  left peroneal veins are not identified. IMPRESSION: 1. No evidence of DVT in the left lower extremity. 2. Limited visualization of the left posterior tibial veins and the left peroneal veins are not identified. Electronically signed by: Evalene Coho MD 01/27/2024 05:43 AM EST RP Workstation: [id number]   DG Chest Port 1 View Result Date: 01/26/2024 CLINICAL DATA:  Questionable sepsis EXAM: PORTABLE CHEST 1 VIEW COMPARISON:  Chest radiograph dated 07/07/2019. FINDINGS: No focal consolidation, pleural effusion or pneumothorax. Top-normal cardiac size. No acute osseous pathology. IMPRESSION: No active disease. Electronically Signed   By: Vanetta Chou M.D.   On: 01/26/2024 13:58        Keelia Graybill, DO [facility] 01/27/2024, 4:50 PM    Dictation software may have been used to generate the above note. Typos may occur and escape review in typed/dictated notes. Please contact Dr Marsa directly for clarity if needed.  Staff may message me via secure chat in Epic  but this may not receive an immediate response,  please page me for urgent matters!  If 7PM-7AM, please contact night coverage [email or web address]

## 2024-01-27 NOTE — Progress Notes (Addendum)
 RN messaged me about pt a bit more somnolent  Labs ordered: VBG, BMP  Pt's only complaint is feeling cold  BP (!) 125/97   Pulse 85   Temp 98.1 F (36.7 C)   Resp (!) 21   Ht 5' 4 (1.626 m)   Wt (!) 136.5 kg   LMP  (LMP Unknown)   SpO2 94%   BMI 51.67 kg/m  Pt is alert, oriented to person/place, not to situation. Increased WOB but not in distress. On BiPAP. Legs are wrapped.   MOST form has been produced, was in paper chart but not scanned to [facility]. Dated 2022 and signed by patient - DNR and limited interventions - no mechanical intubation/ventilation. Updated orders to DNR  VBG reviewed Acidotic, hypercarbic --> BiPAP ordered  BMP reviewed, hyperkalemia a bit better, renal function a bit worse --> insulin/D50, Lokelma, changed fluids to bicarb infusion 1L. If not improving into tomorrow will ask nephrology to assess. OK to hold amio for now - if stays in sinus rhythm and rate <110 ok to leave off, if sustaining >130 please alert on-call hospitalist.           CRITICAL CARE Performed by: Laneta Blunt   Total critical care time: 35 minutes  Critical care time was exclusive of separately billable procedures and treating other patients.  Critical care was necessary to treat or prevent imminent or life-threatening deterioration.  Critical care was time spent personally by me on the following activities: development of treatment plan with patient and/or surrogate as well as nursing, discussions with consultants, evaluation of patient's response to treatment, examination of patient, obtaining history from patient or surrogate, ordering and performing treatments and interventions, ordering and review of laboratory studies, ordering and review of radiographic studies, pulse oximetry and re-evaluation of patient's condition.

## 2024-01-27 NOTE — ED Notes (Signed)
 Pt given juice

## 2024-01-27 NOTE — ED Notes (Signed)
 Pt declining to eat dinner, did not eat lunch. Pt has been sleeping most of the day, responsive to verbal stimuli and painful stimuli. Pt disoriented to time currently. Dr Marsa raker to assess pt.

## 2024-01-27 NOTE — Progress Notes (Signed)
 PHARMACY - PHYSICIAN COMMUNICATION CRITICAL VALUE ALERT - BLOOD CULTURE IDENTIFICATION (BCID)  Results for orders placed or performed during the hospital encounter of 01/26/24  Resp panel by RT-PCR (RSV, Flu A&B, Covid) Anterior Nasal Swab     Status: None   Collection Time: 01/26/24 12:56 PM   Specimen: Anterior Nasal Swab  Result Value Ref Range Status   SARS Coronavirus 2 by RT PCR NEGATIVE NEGATIVE Final    Comment: (NOTE) SARS-CoV-2 target nucleic acids are NOT DETECTED.  The SARS-CoV-2 RNA is generally detectable in upper respiratory specimens during the acute phase of infection. The lowest concentration of SARS-CoV-2 viral copies this assay can detect is 138 copies/mL. A negative result does not preclude SARS-Cov-2 infection and should not be used as the sole basis for treatment or other patient management decisions. A negative result may occur with  improper specimen collection/handling, submission of specimen other than nasopharyngeal swab, presence of viral mutation(s) within the areas targeted by this assay, and inadequate number of viral copies(<138 copies/mL). A negative result must be combined with clinical observations, patient history, and epidemiological information. The expected result is Negative.  Fact Sheet for Patients:  bloggercourse.com  Fact Sheet for Healthcare Providers:  seriousbroker.it  This test is no t yet approved or cleared by the [place]  FDA and  has been authorized for detection and/or diagnosis of SARS-CoV-2 by FDA under an Emergency Use Authorization (EUA). This EUA will remain  in effect (meaning this test can be used) for the duration of the COVID-19 declaration under Section 564(b)(1) of the Act, 21 U.S.C.section 360bbb-3(b)(1), unless the authorization is terminated  or revoked sooner.       Influenza A by PCR NEGATIVE NEGATIVE Final   Influenza B by PCR NEGATIVE NEGATIVE Final     Comment: (NOTE) The Xpert Xpress SARS-CoV-2/FLU/RSV plus assay is intended as an aid in the diagnosis of influenza from Nasopharyngeal swab specimens and should not be used as a sole basis for treatment. Nasal washings and aspirates are unacceptable for Xpert Xpress SARS-CoV-2/FLU/RSV testing.  Fact Sheet for Patients: bloggercourse.com  Fact Sheet for Healthcare Providers: seriousbroker.it  This test is not yet approved or cleared by the [place]  FDA and has been authorized for detection and/or diagnosis of SARS-CoV-2 by FDA under an Emergency Use Authorization (EUA). This EUA will remain in effect (meaning this test can be used) for the duration of the COVID-19 declaration under Section 564(b)(1) of the Act, 21 U.S.C. section 360bbb-3(b)(1), unless the authorization is terminated or revoked.     Resp Syncytial Virus by PCR NEGATIVE NEGATIVE Final    Comment: (NOTE) Fact Sheet for Patients: bloggercourse.com  Fact Sheet for Healthcare Providers: seriousbroker.it  This test is not yet approved or cleared by the [place]  FDA and has been authorized for detection and/or diagnosis of SARS-CoV-2 by FDA under an Emergency Use Authorization (EUA). This EUA will remain in effect (meaning this test can be used) for the duration of the COVID-19 declaration under Section 564(b)(1) of the Act, 21 U.S.C. section 360bbb-3(b)(1), unless the authorization is terminated or revoked.  Performed at [facility], [address] [place], [place] [address]   Blood Culture (routine x 2)     Status: None (Preliminary result)   Collection Time: 01/26/24 12:56 PM   Specimen: BLOOD  Result Value Ref Range Status   Specimen Description BLOOD BLOOD RIGHT FOREARM  Final   Special Requests   Final    BOTTLES DRAWN AEROBIC AND ANAEROBIC Blood  Culture results may not be optimal due  to an inadequate volume of blood received in culture bottles   Culture  Setup Time   Final    GRAM NEGATIVE RODS AEROBIC BOTTLE ONLY CRITICAL VALUE NOTED.  VALUE IS CONSISTENT WITH PREVIOUSLY REPORTED AND CALLED VALUE. Performed at [facility], [address] [place], [place] [address]    Culture PENDING  Incomplete   Report Status PENDING  Incomplete  Blood Culture (routine x 2)     Status: None (Preliminary result)   Collection Time: 01/26/24  1:01 PM   Specimen: BLOOD  Result Value Ref Range Status   Specimen Description BLOOD RIGHT ARM  Final   Special Requests   Final    BOTTLES DRAWN AEROBIC AND ANAEROBIC Blood Culture adequate volume   Culture  Setup Time   Final    GRAM NEGATIVE RODS AEROBIC BOTTLE ONLY Organism ID to follow CRITICAL RESULT CALLED TO, READ BACK BY AND VERIFIED WITHBETHA RANKIN DILLS AT 0541 01/27/24 JG Performed at [facility], [address] [place], [place] [address]    Culture PENDING  Incomplete   Report Status PENDING  Incomplete  Blood Culture ID Panel (Reflexed)     Status: Abnormal   Collection Time: 01/26/24  1:01 PM  Result Value Ref Range Status   Enterococcus faecalis NOT DETECTED NOT DETECTED Final   Enterococcus Faecium NOT DETECTED NOT DETECTED Final   Listeria monocytogenes NOT DETECTED NOT DETECTED Final   Staphylococcus species NOT DETECTED NOT DETECTED Final   Staphylococcus aureus (BCID) NOT DETECTED NOT DETECTED Final   Staphylococcus epidermidis NOT DETECTED NOT DETECTED Final   Staphylococcus lugdunensis NOT DETECTED NOT DETECTED Final   Streptococcus species NOT DETECTED NOT DETECTED Final   Streptococcus agalactiae NOT DETECTED NOT DETECTED Final   Streptococcus pneumoniae NOT DETECTED NOT DETECTED Final   Streptococcus pyogenes NOT DETECTED NOT DETECTED Final   A.calcoaceticus-baumannii NOT DETECTED NOT DETECTED Final   Bacteroides fragilis NOT DETECTED NOT DETECTED Final   Enterobacterales NOT DETECTED  NOT DETECTED Final   Enterobacter cloacae complex NOT DETECTED NOT DETECTED Final   Escherichia coli NOT DETECTED NOT DETECTED Final   Klebsiella aerogenes NOT DETECTED NOT DETECTED Final   Klebsiella oxytoca NOT DETECTED NOT DETECTED Final   Klebsiella pneumoniae NOT DETECTED NOT DETECTED Final   Proteus species NOT DETECTED NOT DETECTED Final   Salmonella species NOT DETECTED NOT DETECTED Final   Serratia marcescens NOT DETECTED NOT DETECTED Final   Haemophilus influenzae NOT DETECTED NOT DETECTED Final   Neisseria meningitidis NOT DETECTED NOT DETECTED Final   Pseudomonas aeruginosa DETECTED (A) NOT DETECTED Final    Comment: CRITICAL RESULT CALLED TO, READ BACK BY AND VERIFIED WITH:  Lam Mccubbins AT 0541 01/27/24 JG    Stenotrophomonas maltophilia NOT DETECTED NOT DETECTED Final   Candida albicans NOT DETECTED NOT DETECTED Final   Candida auris NOT DETECTED NOT DETECTED Final   Candida glabrata NOT DETECTED NOT DETECTED Final   Candida krusei NOT DETECTED NOT DETECTED Final   Candida parapsilosis NOT DETECTED NOT DETECTED Final   Candida tropicalis NOT DETECTED NOT DETECTED Final   Cryptococcus neoformans/gattii NOT DETECTED NOT DETECTED Final   CTX-M ESBL NOT DETECTED NOT DETECTED Final   Carbapenem resistance IMP NOT DETECTED NOT DETECTED Final   Carbapenem resistance KPC NOT DETECTED NOT DETECTED Final   Carbapenem resistance NDM NOT DETECTED NOT DETECTED Final   Carbapenem resistance VIM NOT DETECTED NOT DETECTED Final    Comment: Performed  at [facility], [address] [place], [place] [address]    BCID Results: 2 (both aerobic) of 4 bottles with Pseudomonas.  Pt currently on Cefepime and Vancomycin.  Name of provider contacted: HILARIO Solian, MD   Changes to prescribed antibiotics required: No changes at this time.  Rankin CANDIE Dills, PharmD, [facility] 01/27/2024 5:54 AM

## 2024-01-27 NOTE — ED Notes (Signed)
 Fall risk bundle in place.

## 2024-01-28 ENCOUNTER — Inpatient Hospital Stay
Admit: 2024-01-28 | Discharge: 2024-01-28 | Disposition: A | Discharge status: Home / Self Care | Attending: Internal Medicine | Admitting: Internal Medicine

## 2024-01-28 ENCOUNTER — Other Ambulatory Visit (HOSPITAL_COMMUNITY): Payer: Self-pay

## 2024-01-28 ENCOUNTER — Inpatient Hospital Stay

## 2024-01-28 ENCOUNTER — Telehealth (HOSPITAL_COMMUNITY): Payer: Self-pay | Admitting: Pharmacy Technician

## 2024-01-28 DIAGNOSIS — J9621 Acute and chronic respiratory failure with hypoxia: Secondary | ICD-10-CM

## 2024-01-28 DIAGNOSIS — D72829 Elevated white blood cell count, unspecified: Secondary | ICD-10-CM | POA: Diagnosis not present

## 2024-01-28 DIAGNOSIS — Z87891 Personal history of nicotine dependence: Secondary | ICD-10-CM

## 2024-01-28 DIAGNOSIS — B965 Pseudomonas (aeruginosa) (mallei) (pseudomallei) as the cause of diseases classified elsewhere: Secondary | ICD-10-CM | POA: Diagnosis not present

## 2024-01-28 DIAGNOSIS — L0390 Cellulitis, unspecified: Secondary | ICD-10-CM | POA: Diagnosis not present

## 2024-01-28 DIAGNOSIS — G253 Myoclonus: Secondary | ICD-10-CM

## 2024-01-28 DIAGNOSIS — R7881 Bacteremia: Secondary | ICD-10-CM | POA: Diagnosis not present

## 2024-01-28 DIAGNOSIS — A419 Sepsis, unspecified organism: Secondary | ICD-10-CM | POA: Diagnosis not present

## 2024-01-28 DIAGNOSIS — I890 Lymphedema, not elsewhere classified: Secondary | ICD-10-CM | POA: Diagnosis not present

## 2024-01-28 LAB — BASIC METABOLIC PANEL WITH GFR
Anion gap: 10 (ref 5–15)
BUN: 63 mg/dL — ABNORMAL HIGH (ref 8–23)
CO2: 27 mmol/L (ref 22–32)
Calcium: 8.9 mg/dL (ref 8.9–10.3)
Chloride: 99 mmol/L (ref 98–111)
Creatinine, Ser: 2.78 mg/dL — ABNORMAL HIGH (ref 0.44–1.00)
GFR, Estimated: 16 mL/min — ABNORMAL LOW (ref >60)
Glucose, Bld: 92 mg/dL (ref 70–99)
Potassium: 4.9 mmol/L (ref 3.5–5.1)
Sodium: 136 mmol/L (ref 135–145)

## 2024-01-28 LAB — CBC
HCT: 27.3 % — ABNORMAL LOW (ref 36.0–46.0)
Hemoglobin: 8.1 g/dL — ABNORMAL LOW (ref 12.0–15.0)
MCH: 28.6 pg (ref 26.0–34.0)
MCHC: 29.7 g/dL — ABNORMAL LOW (ref 30.0–36.0)
MCV: 96.5 fL (ref 80.0–100.0)
Platelets: 232 K/uL (ref 150–400)
RBC: 2.83 MIL/uL — ABNORMAL LOW (ref 3.87–5.11)
RDW: 13.9 % (ref 11.5–15.5)
WBC: 21.6 K/uL — ABNORMAL HIGH (ref 4.0–10.5)
nRBC: 0 % (ref 0.0–0.2)

## 2024-01-28 LAB — ECHOCARDIOGRAM COMPLETE
Height: 64 in
S' Lateral: 2.8 cm
Weight: 4938.3 [oz_av]

## 2024-01-28 LAB — PRO BRAIN NATRIURETIC PEPTIDE: Pro Brain Natriuretic Peptide: 9777 pg/mL — ABNORMAL HIGH (ref <300.0)

## 2024-01-28 LAB — SODIUM, URINE, RANDOM: Sodium, Ur: <30 mmol/L

## 2024-01-28 LAB — GLUCOSE, CAPILLARY
Glucose-Capillary: 70 mg/dL (ref 70–99)
Glucose-Capillary: 101 mg/dL — ABNORMAL HIGH (ref 70–99)
Glucose-Capillary: 80 mg/dL (ref 70–99)
Glucose-Capillary: 85 mg/dL (ref 70–99)

## 2024-01-28 LAB — CREATININE, URINE, RANDOM: Creatinine, Urine: 61 mg/dL

## 2024-01-28 MED ORDER — FUROSEMIDE 10 MG/ML IJ SOLN
60.0000 mg | Freq: Once | INTRAMUSCULAR | Status: AC
  Administered 2024-01-29: 60 mg via INTRAVENOUS
  Filled 2024-01-28: qty 6

## 2024-01-28 MED ORDER — FUROSEMIDE 10 MG/ML IJ SOLN
INTRAMUSCULAR | Status: AC
  Filled 2024-01-28: qty 8

## 2024-01-28 MED ORDER — APIXABAN 2.5 MG PO TABS
2.5000 mg | ORAL_TABLET | Freq: Two times a day (BID) | ORAL | Status: DC
  Administered 2024-01-29 – 2024-02-08 (×21): 2.5 mg via ORAL
  Filled 2024-01-28 (×21): qty 1

## 2024-01-28 MED ORDER — FUROSEMIDE 10 MG/ML IJ SOLN
60.0000 mg | Freq: Once | INTRAMUSCULAR | Status: AC
  Administered 2024-01-28: 60 mg via INTRAVENOUS
  Filled 2024-01-28: qty 6

## 2024-01-28 MED ORDER — AMIODARONE HCL 200 MG PO TABS: 200.0000 mg | ORAL_TABLET | Freq: Every day | ORAL | Status: DC

## 2024-01-28 MED ORDER — LORAZEPAM 2 MG/ML IJ SOLN
1.0000 mg | Freq: Once | INTRAMUSCULAR | Status: AC
  Administered 2024-01-28: 1 mg via INTRAVENOUS
  Filled 2024-01-28: qty 1

## 2024-01-28 MED ORDER — SODIUM CHLORIDE 0.9 % IV SOLN
2.0000 g | INTRAVENOUS | Status: DC
  Administered 2024-01-29 – 2024-02-02 (×5): 2 g via INTRAVENOUS
  Filled 2024-01-28 (×5): qty 2

## 2024-01-28 MED ORDER — DEXTROSE 50 % IV SOLN
25.0000 mL | Freq: Once | INTRAVENOUS | Status: AC
  Administered 2024-01-28: 25 mL via INTRAVENOUS
  Filled 2024-01-28: qty 50

## 2024-01-28 MED ORDER — LACTATED RINGERS IV BOLUS
1000.0000 mL | Freq: Once | INTRAVENOUS | Status: AC
  Administered 2024-01-28: 1000 mL via INTRAVENOUS

## 2024-01-28 MED ORDER — AMIODARONE HCL 200 MG PO TABS: 400.0000 mg | ORAL_TABLET | Freq: Two times a day (BID) | ORAL | Status: DC

## 2024-01-28 NOTE — Progress Notes (Addendum)
 PT Cancellation Note  Patient Details Name: Kelly Hogan MRN: [id number] DOB: [birth year]   Cancelled Treatment:    Reason Eval/Treat Not Completed: Patient not medically ready;Medical issues which prohibited therapy. Chart reviewed. Pt still on Bi-pap and not medically stable to attempt PT eval. Per discussion with nursing, will hold for now. Will re-attempt this afternoon.   Addendum: Per nursing note pt trialed off BiPAP and unable to tolerate O2 via [place]. Pt placed back on BiPAP. Will re-attempt tomorrow when medically stable.  Delane Stalling, SPT   Day Greb 01/28/2024, 9:42 AM

## 2024-01-28 NOTE — Progress Notes (Signed)
 HR increased to 130s-140. Pt back on the amiodarone drip

## 2024-01-28 NOTE — Telephone Encounter (Addendum)
 Patient Product/process Development Scientist completed.    The patient is insured through [facility]. Patient has Medicare and is not eligible for a copay card, but may be able to apply for patient assistance or Medicare RX Payment Plan (Patient Must reach out to their plan, if eligible for payment plan), if available.    Ran test claim for Eliquis 5 mg and the current 30 day co-pay is $0.00.  Ran test claim for Breztri and the current 30 day co-pay is $0.00.  This test claim was processed through [facility]- copay amounts may vary at other pharmacies due to pharmacy/plan contracts, or as the patient moves through the different stages of their insurance plan.     Reyes Sharps, CPHT Pharmacy Technician Patient Advocate Specialist [facility] Patient Advocate Team Direct Number: [phone]  Fax: [phone]

## 2024-01-28 NOTE — Progress Notes (Signed)
 Still not improving w/ BiPAP  S: pt reports breathing OK on BiPAP but she's frustrated w/ the presence of the mask. Son at bedside is very sure she has BiPAP at bedtime at home though records note CPAP   O: lung exam difficult d/t habitus, but sounds clear. CV RRR, no increased EL edema. Pt alert and oriented Echo reviewed, preserved EF but diast unable to determine, no LVH  A/P: Suspicion this resp failure may be CHF, though appears euvolemic on exam she does have Hx HFpEF and has had fluids for sepsis and low BP, no other good reason for renal worsening so may be cardiorenal  Results: CXR and added BNP --> congestion and significant increase in BNP  Plan adjustments: Lasix  60 mg IV x1. If BP holds ok, would repeat this overnight - I placed order w/ holding parameters          CRITICAL CARE Performed by: Laneta Blunt   Total critical care time: 30 minutes  Critical care time was exclusive of separately billable procedures and treating other patients.  Critical care was necessary to treat or prevent imminent or life-threatening deterioration.  Critical care was time spent personally by me on the following activities: development of treatment plan with patient and/or surrogate as well as nursing, discussions with consultants, evaluation of patient's response to treatment, examination of patient, obtaining history from patient or surrogate, ordering and performing treatments and interventions, ordering and review of laboratory studies, ordering and review of radiographic studies, pulse oximetry and re-evaluation of patient's condition.

## 2024-01-28 NOTE — Consult Note (Signed)
 [facility] CLINIC CARDIOLOGY CONSULT NOTE       Patient ID: Kelly Hogan MRN: [id number] DOB/AGE: 03/23/1940 83 y.o.  Admit date: 01/26/2024 Referring Physician Dr. Laneta Blunt Primary Physician [name], [name] LABOR, MD  Primary Cardiologist Damien Lee, NP Reason for Consultation AF, bacteremia  HPI: Kelly Hogan is a 83 y.o. female  with a past medical history of COPD, chronic respiratory failure on 4 L, hypertension, hyperlipidemia, type 2 diabetes, obesity, obesity hypoventilation syndrome, history of venous insufficiency who presented to the ED on 01/26/2024 for concern for leg infection.  Noted to be in atrial fibrillation RVR initially, placed on IV amiodarone and ultimately converted to normal sinus rhythm.  Blood cultures were positive for Pseudomonas.  Cardiology was consulted for further evaluation.   Patient advised to come to the ED by telehealth provider due to concern for lower extremity cellulitis.  On admission there was concern for atrial fibrillation and thus she was started on IV amiodarone.  Ultimately converted to normal sinus rhythm.  Blood cultures were found to be positive for Pseudomonas.  Notable lab work today with creatinine 2.78, potassium 4.9, hemoglobin 8.1, WBC 21.6. Troponin 89. EKG in the ED Sinus rhythm rate 81 bpm.  Today she remains on IV amiodarone, IV antibiotics.  At the time my evaluation this morning patient is resting in bed on BiPAP.  This was started last night due to worsening respiratory status and abnormal ABG.  History is limited due to this.  She states that she is hurting all over.  Review of systems complete and found to be negative unless listed above    Past Medical History:  Diagnosis Date   Asthma    COPD (chronic obstructive pulmonary disease) (HCC)    Diabetes mellitus without complication (HCC)    Hypertension    Morbid obesity with BMI of 50.0-59.9, adult (HCC) 07/13/2019   Stomach ulcer     No past surgical  history on file.  Medications Prior to Admission  Medication Sig Dispense Refill Last Dose/Taking   acetaminophen  (TYLENOL ) 500 MG tablet Take 500-1,000 mg by mouth every 6 (six) hours as needed for mild pain or fever.    01/26/2024   albuterol  (PROVENTIL ) (2.5 MG/3ML) 0.083% nebulizer solution Take 3 mLs by nebulization every 6 (six) hours as needed for wheezing.   01/26/2024   aspirin  81 MG EC tablet Take 81 mg by mouth daily.   01/25/2024   carvedilol  (COREG ) 25 MG tablet Take 12.5 mg by mouth in the morning and at bedtime.   01/25/2024   Cholecalciferol  25 MCG (1000 UT) tablet Take 1,000 Units by mouth daily.   01/25/2024   ciclopirox (LOPROX) 0.77 % cream Apply 0.77 % topically 2 (two) times daily.   01/25/2024   diclofenac Sodium (VOLTAREN) 1 % GEL Apply 2 g topically 4 (four) times daily.   01/26/2024   docusate sodium  (COLACE) 100 MG capsule Take 100 mg by mouth 2 (two) times daily.  4 01/26/2024   empagliflozin (JARDIANCE) 10 MG TABS tablet Take 1 tablet by mouth daily.   01/25/2024   fluticasone  (FLONASE ) 50 MCG/ACT nasal spray Place 1 spray into both nostrils daily.    01/25/2024   gatifloxacin (ZYMAXID) 0.5 % SOLN Place 1 drop into the left eye 4 (four) times daily.   Past Month   ketorolac (ACULAR) 0.5 % ophthalmic solution Place 1 drop into the left eye 2 (two) times daily.   Unknown   Lidocaine 4 % PTCH Place onto the  skin.   Past Month   olmesartan (BENICAR) 5 MG tablet Take 5 mg by mouth daily.   01/25/2024   omeprazole (PRILOSEC) 20 MG capsule Take 20 mg by mouth daily.   01/25/2024   Oyster Shell 500 MG TABS Take 500 mg by mouth in the morning, at noon, and at bedtime.   01/25/2024   pravastatin (PRAVACHOL) 20 MG tablet Take 20 mg by mouth daily.   01/25/2024   prednisoLONE acetate (PRED FORTE) 1 % ophthalmic suspension Place 1 drop into the left eye 4 (four) times daily.   Past Month   PROAIR  HFA 108 (90 Base) MCG/ACT inhaler Inhale 2 puffs into the lungs every 4 (four) hours  as needed for wheezing or shortness of breath.    01/26/2024   senna (SENOKOT) 8.6 MG tablet Take 1 tablet by mouth daily.   01/25/2024   Torsemide 40 MG TABS Take 40 mg by mouth 2 (two) times daily.   01/25/2024   TRELEGY ELLIPTA 100-62.5-25 MCG/INH AEPB Take 1 puff by mouth daily.   01/25/2024   amLODipine (NORVASC) 5 MG tablet Take 5 mg by mouth daily. (Patient not taking: Reported on 01/26/2024)   Not Taking   ferrous sulfate 324 MG TBEC Take 324 mg by mouth.   Unknown   furosemide  (LASIX ) 40 MG tablet Take 40-80 mg by mouth as directed.  (Patient not taking: Reported on 01/26/2024)   Not Taking   hydrALAZINE  (APRESOLINE ) 25 MG tablet Take 25 mg by mouth 2 (two) times daily. (Patient not taking: Reported on 01/26/2024)   Not Taking   ipratropium-albuterol  (DUONEB) 0.5-2.5 (3) MG/3ML SOLN Take 3 mLs by nebulization every 6 (six) hours as needed (wheezing). (Patient not taking: Reported on 01/26/2024)   Not Taking   omeprazole (PRILOSEC) 20 MG capsule Take by mouth.      spironolactone  (ALDACTONE ) 25 MG tablet Take 25 mg by mouth daily. (Patient not taking: Reported on 01/26/2024)  3 Not Taking   telmisartan (MICARDIS) 80 MG tablet Take 80 mg by mouth daily. (Patient not taking: Reported on 01/26/2024)   Not Taking   Social History   Socioeconomic History   Marital status: Single    Spouse name: Not on file   Number of children: Not on file   Years of education: Not on file   Highest education level: Not on file  Occupational History   Not on file  Tobacco Use   Smoking status: Former    Types: Cigarettes   Smokeless tobacco: Never  Substance and Sexual Activity   Alcohol use: No   Drug use: No   Sexual activity: Not on file  Other Topics Concern   Not on file  Social History Narrative   Not on file   Social Drivers of Health   Financial Resource Strain: Low Risk (08/12/2023)   Received from [facility]   Overall Financial Resource Strain (CARDIA)    Difficulty of Paying  Living Expenses: Not very hard  Food Insecurity: Patient Declined (01/27/2024)   Hunger Vital Sign    Worried About Running Out of Food in the Last Year: Patient declined    Ran Out of Food in the Last Year: Patient declined  Transportation Needs: Patient Declined (01/27/2024)   PRAPARE - Administrator, Civil Service (Medical): Patient declined    Lack of Transportation (Non-Medical): Patient declined  Physical Activity: Not on file  Stress: No Stress Concern Present (11/17/2023)   Received from [facility]  [employer] of Occupational Health - Occupational Stress Questionnaire    Do you feel stress - tense, restless, nervous, or anxious, or unable to sleep at night because your mind is troubled all the time - these days?: Only a little  Social Connections: Patient Declined (01/27/2024)   Social Connection and Isolation Panel    Frequency of Communication with Friends and Family: Patient declined    Frequency of Social Gatherings with Friends and Family: Patient declined    Attends Religious Services: Patient declined    Active Member of Clubs or Organizations: Patient declined    Attends Banker Meetings: Patient declined    Marital Status: Patient declined  Intimate Partner Violence: Patient Declined (01/27/2024)   Humiliation, Afraid, Rape, and Kick questionnaire    Fear of Current or Ex-Partner: Patient declined    Emotionally Abused: Patient declined    Physically Abused: Patient declined    Sexually Abused: Patient declined    Family History  Problem Relation Age of Onset   Hypertension Sister    Diabetes Mellitus II Brother      Vitals:   01/28/24 0436 01/28/24 0500 01/28/24 0700 01/28/24 0813  BP: (!) 117/59     Pulse:   89   Resp: (!) 21   (!) 22  Temp:   99.6 F (37.6 C)   TempSrc:   Axillary   SpO2:   99% 95%  Weight:  (!) 140 kg    Height:        PHYSICAL EXAM General: Chronically ill appearing female, well nourished, in no  acute distress. HEENT: Normocephalic and atraumatic. Neck: No JVD.  Lungs: Normal respiratory effort on BiPAP. Coarse breath sounds bilaterally.  Heart: HRRR. Normal S1 and S2 without gallops or murmurs.  Abdomen: Non-distended appearing.  Msk: Normal strength and tone for age. Extremities: Warm and well perfused. No clubbing, cyanosis. Bilateral LE with edema and erythema.  Neuro: Alert and oriented X 3. Psych: Answers questions appropriately.   Labs: Basic Metabolic Panel: Recent Labs    01/26/24 1400 01/27/24 0456 01/27/24 1706 01/28/24 0409  NA 139   < > 137 136  K 4.7   < > 5.4* 4.9  CL 102   < > 99 99  CO2 24   < > 26 27  GLUCOSE 133*   < > 80 92  BUN 54*   < > 60* 63*  CREATININE 2.03*   < > 2.70* 2.78*  CALCIUM  9.1   < > 9.3 8.9  MG 2.2  --   --   --    < > = values in this interval not displayed.   Liver Function Tests: Recent Labs    01/26/24 1400  AST 16  ALT 8  ALKPHOS 45  BILITOT 0.4  PROT 6.8  ALBUMIN 3.8   No results for input(s): LIPASE, AMYLASE in the last 72 hours. CBC: Recent Labs    01/26/24 1256 01/27/24 0456 01/28/24 0409  WBC 8.5 24.0* 21.6*  NEUTROABS 7.9*  --   --   HGB 10.6* 8.7* 8.1*  HCT 35.0* 29.4* 27.3*  MCV 93.1 96.4 96.5  PLT 325 249 232   Cardiac Enzymes: No results for input(s): CKTOTAL, CKMB, CKMBINDEX, TROPONINIHS in the last 72 hours. BNP: No results for input(s): BNP in the last 72 hours. D-Dimer: No results for input(s): DDIMER in the last 72 hours. Hemoglobin A1C: No results for input(s): HGBA1C in the last 72 hours. Fasting Lipid Panel: No results for input(s):  CHOL, HDL, LDLCALC, TRIG, CHOLHDL, LDLDIRECT in the last 72 hours. Thyroid Function Tests: Recent Labs    01/26/24 1256  TSH 5.650*   Anemia Panel: No results for input(s): VITAMINB12, FOLATE, FERRITIN, TIBC, IRON, RETICCTPCT in the last 72 hours.   Radiology: US  Venous Img Lower Unilateral Left  (DVT) Result Date: 01/27/2024 EXAM: ULTRASOUND DUPLEX OF THE LEFT LOWER EXTREMITY VEINS TECHNIQUE: Duplex ultrasound using B-mode/gray scaled imaging and Doppler spectral analysis and color flow was obtained of the deep venous structures of the left lower extremity. COMPARISON: None available. CLINICAL HISTORY: Swelling. FINDINGS: There is no evidence of deep venous thrombosis within the left lower extremity. The common femoral vein, femoral vein, and popliteal vein of the left lower extremity demonstrate normal compressibility with normal color flow and spectral analysis. There is limited visualization of the left posterior tibial veins and the left peroneal veins are not identified. IMPRESSION: 1. No evidence of DVT in the left lower extremity. 2. Limited visualization of the left posterior tibial veins and the left peroneal veins are not identified. Electronically signed by: Evalene Coho MD 01/27/2024 05:43 AM EST RP Workstation: [id number]   DG Chest Port 1 View Result Date: 01/26/2024 CLINICAL DATA:  Questionable sepsis EXAM: PORTABLE CHEST 1 VIEW COMPARISON:  Chest radiograph dated 07/07/2019. FINDINGS: No focal consolidation, pleural effusion or pneumothorax. Top-normal cardiac size. No acute osseous pathology. IMPRESSION: No active disease. Electronically Signed   By: Vanetta Chou M.D.   On: 01/26/2024 13:58    ECHO pending  TELEMETRY (personally reviewed): Sinus rhythm rate 90s  EKG (personally reviewed): Sinus rhythm rate 81 bpm  Data reviewed by me 01/28/2024: last 24h vitals tele labs imaging I/O ED provider note, admission H&P  Principal Problem:   Sepsis due to cellulitis [facility] Active Problems:   Essential (primary) hypertension   Type 2 diabetes mellitus (HCC)   Chronic venous insufficiency   HLD (hyperlipidemia)   Morbid obesity with BMI of 50.0-59.9, adult (HCC)   OSA (obstructive sleep apnea)   Obesity hypoventilation syndrome (HCC)   (HFpEF) heart failure with  preserved ejection fraction (HCC)    ASSESSMENT AND PLAN:  Kelly Hogan is a 83 y.o. female  with a past medical history of COPD, chronic respiratory failure on 4 L, hypertension, hyperlipidemia, type 2 diabetes, obesity, obesity hypoventilation syndrome, history of venous insufficiency who presented to the ED on 01/26/2024 for concern for leg infection.  Noted to be in atrial fibrillation RVR initially, placed on IV amiodarone and ultimately converted to normal sinus rhythm.  Blood cultures were positive for Pseudomonas.  Cardiology was consulted for further evaluation.   # Acute on chronic hypoxic respiratory failure # Atrial fibrillation RVR # New onset atrial fibrillation # Cellulitis # Pseudomonas bacteremia # Hypertension Patient sent to the ED for evaluation of cellulitis, ultimately developed atrial fibrillation RVR which was treated with IV amiodarone and she converted to normal sinus rhythm yesterday.  Found to have positive blood cultures growing Pseudomonas.  Yesterday evening developed worsening hypoxia and was placed on BiPAP. - Will transition to p.o. amiodarone load with 400 mg twice daily for 10 days followed by 200 mg daily. - Will start Eliquis 2.5 mg twice daily (dose appropriate given age, renal function). - Continue pravastatin 20 mg daily. - TEE will likely be needed given bacteremia however patient is not a candidate at this time given her respiratory status.  Will likely consider next week. - Further management of cellulitis, bacteremia as per primary team.  This  patient's plan of care was discussed and created with Dr. Ammon and he is in agreement.  Signed: Danita Bloch, PA-C  01/28/2024, 9:16 AM [facility] Cardiology

## 2024-01-28 NOTE — Progress Notes (Signed)
 OT Cancellation Note  Patient Details Name: Kelly Hogan MRN: [id number] DOB: Aug 03, 1940   Cancelled Treatment:    Reason Eval/Treat Not Completed: Patient not medically ready. Pt still on BiPAP per RN. Will follow acutely and re-attempt OT evaluation as medically appropriate.   Awab Abebe R., MPH, MS, OTR/L ascom [phone] 01/28/24, 9:43 AM

## 2024-01-28 NOTE — Consult Note (Signed)
 [facility]  CONSULT NOTE    Date: 01/28/2024                  Patient Name:  Kelly Hogan  MRN: [id number]  DOB: 06-10-1940  Age / Sex: 83 y.o., female         PCP: [name] [name] LABOR, MD                 Service Requesting Consult: TRH                 Reason for Consult: Acute kidney injury            History of Present Illness: Kelly Hogan is a 83 y.o.  female with past medical history including hyperlipidemia, type 2 diabetes, hypertension, obesity, and COPD, who was admitted to [facility] on 01/26/2024 for SVT (supraventricular tachycardia) [I47.10] Cellulitis of left lower extremity [L03.116] Sepsis due to cellulitis (HCC) [L03.90, A41.9] Sepsis, due to unspecified organism, unspecified whether acute organ dysfunction present [facility]) [A41.9]  Patient presents to the emergency department for further evaluation of leg wound.  Patient appears critically ill at bedside.  Currently on BiPAP, somnolent.  Amiodarone drip in place.  Chart review used to obtain history.  Appears patient was recently seen via telehealth for a leg wound.  Directed to come to the emergency department for further evaluation, possible infection.  Patient stated she began experiencing chills today before presentation, no known fever.  Denies shortness of breath or cough.  Renal function on ED arrival 2.03 with GFR 24 and BUN 54.  UA appears clear with mild proteinuria.  Blood culture positive for Pseudomonas aeruginosa.  Hemoglobin decreased, 8.7.  White count elevated 24.0.  Creatinine has increased since admission, 2.7 with slightly increased potassium as well, 5.4.  Chest x-ray negative for acute findings.  Left lower extremity ultrasound negative for DVT.  We have been consulted to assist in management of acute kidney injury and hyperkalemia.   Medications: Outpatient medications: Medications Prior to Admission  Medication Sig Dispense Refill Last Dose/Taking   acetaminophen   (TYLENOL ) 500 MG tablet Take 500-1,000 mg by mouth every 6 (six) hours as needed for mild pain or fever.    01/26/2024   albuterol  (PROVENTIL ) (2.5 MG/3ML) 0.083% nebulizer solution Take 3 mLs by nebulization every 6 (six) hours as needed for wheezing.   01/26/2024   aspirin  81 MG EC tablet Take 81 mg by mouth daily.   01/25/2024   carvedilol  (COREG ) 25 MG tablet Take 12.5 mg by mouth in the morning and at bedtime.   01/25/2024   Cholecalciferol  25 MCG (1000 UT) tablet Take 1,000 Units by mouth daily.   01/25/2024   ciclopirox (LOPROX) 0.77 % cream Apply 0.77 % topically 2 (two) times daily.   01/25/2024   diclofenac Sodium (VOLTAREN) 1 % GEL Apply 2 g topically 4 (four) times daily.   01/26/2024   docusate sodium  (COLACE) 100 MG capsule Take 100 mg by mouth 2 (two) times daily.  4 01/26/2024   empagliflozin (JARDIANCE) 10 MG TABS tablet Take 1 tablet by mouth daily.   01/25/2024   fluticasone  (FLONASE ) 50 MCG/ACT nasal spray Place 1 spray into both nostrils daily.    01/25/2024   gatifloxacin (ZYMAXID) 0.5 % SOLN Place 1 drop into the left eye 4 (four) times daily.   Past Month   ketorolac (ACULAR) 0.5 % ophthalmic solution Place 1 drop into the left eye 2 (two) times daily.  Unknown   Lidocaine 4 % PTCH Place onto the skin.   Past Month   olmesartan (BENICAR) 5 MG tablet Take 5 mg by mouth daily.   01/25/2024   omeprazole (PRILOSEC) 20 MG capsule Take 20 mg by mouth daily.   01/25/2024   Oyster Shell 500 MG TABS Take 500 mg by mouth in the morning, at noon, and at bedtime.   01/25/2024   pravastatin (PRAVACHOL) 20 MG tablet Take 20 mg by mouth daily.   01/25/2024   prednisoLONE acetate (PRED FORTE) 1 % ophthalmic suspension Place 1 drop into the left eye 4 (four) times daily.   Past Month   PROAIR  HFA 108 (90 Base) MCG/ACT inhaler Inhale 2 puffs into the lungs every 4 (four) hours as needed for wheezing or shortness of breath.    01/26/2024   senna (SENOKOT) 8.6 MG tablet Take 1 tablet by mouth  daily.   01/25/2024   Torsemide 40 MG TABS Take 40 mg by mouth 2 (two) times daily.   01/25/2024   TRELEGY ELLIPTA 100-62.5-25 MCG/INH AEPB Take 1 puff by mouth daily.   01/25/2024   amLODipine (NORVASC) 5 MG tablet Take 5 mg by mouth daily. (Patient not taking: Reported on 01/26/2024)   Not Taking   ferrous sulfate 324 MG TBEC Take 324 mg by mouth.   Unknown   furosemide  (LASIX ) 40 MG tablet Take 40-80 mg by mouth as directed.  (Patient not taking: Reported on 01/26/2024)   Not Taking   hydrALAZINE  (APRESOLINE ) 25 MG tablet Take 25 mg by mouth 2 (two) times daily. (Patient not taking: Reported on 01/26/2024)   Not Taking   ipratropium-albuterol  (DUONEB) 0.5-2.5 (3) MG/3ML SOLN Take 3 mLs by nebulization every 6 (six) hours as needed (wheezing). (Patient not taking: Reported on 01/26/2024)   Not Taking   omeprazole (PRILOSEC) 20 MG capsule Take by mouth.      spironolactone  (ALDACTONE ) 25 MG tablet Take 25 mg by mouth daily. (Patient not taking: Reported on 01/26/2024)  3 Not Taking   telmisartan (MICARDIS) 80 MG tablet Take 80 mg by mouth daily. (Patient not taking: Reported on 01/26/2024)   Not Taking    Current medications: Current Facility-Administered Medications  Medication Dose Route Frequency Provider Last Rate Last Admin   acetaminophen  (TYLENOL ) tablet 650 mg  650 mg Oral Q6H PRN Fernand Prost, MD       Or   acetaminophen  (TYLENOL ) suppository 650 mg  650 mg Rectal Q6H PRN Khan, Ghalib, MD   650 mg at 01/27/24 0335   amiodarone (NEXTERONE PREMIX) 360-4.14 MG/200ML-% (1.8 mg/mL) IV infusion  30 mg/hr Intravenous Continuous Hudson, Caralyn, PA-C 16.67 mL/hr at 01/28/24 1551 30 mg/hr at 01/28/24 1551   apixaban (ELIQUIS) tablet 2.5 mg  2.5 mg Oral BID Hudson, Caralyn, PA-C       budesonide-glycopyrrolate-formoterol (BREZTRI) 160-9-4.8 MCG/ACT inhaler 2 puff  2 puff Inhalation BID Khan, Ghalib, MD   2 puff at 01/27/24 0920   [START ON 01/29/2024] cefTAZidime (FORTAZ) 2 g in sodium chloride   0.9 % 100 mL IVPB  2 g Intravenous Q24H Fayette Bodily, MD       furosemide  (LASIX ) injection 60 mg  60 mg Intravenous Once Alexander, Natalie, DO       [START ON 01/29/2024] furosemide  (LASIX ) injection 60 mg  60 mg Intravenous Once Alexander, Natalie, DO       Influenza vac split trivalent PF (FLUZONE HIGH-DOSE) injection 0.5 mL  0.5 mL Intramuscular Tomorrow-1000 Marsa Edelman, DO  midodrine (PROAMATINE) tablet 5 mg  5 mg Oral TID WC Alexander, Natalie, DO   5 mg at 01/28/24 1725   pantoprazole  (PROTONIX ) EC tablet 40 mg  40 mg Oral Daily Khan, Ghalib, MD   40 mg at 01/27/24 0919   pravastatin (PRAVACHOL) tablet 20 mg  20 mg Oral QPM Khan, Ghalib, MD   20 mg at 01/28/24 1725   senna-docusate (Senokot-S) tablet 1 tablet  1 tablet Oral QHS PRN Fernand Prost, MD       sodium chloride  flush (NS) 0.9 % injection 3 mL  3 mL Intravenous Q12H Khan, Ghalib, MD   3 mL at 01/28/24 1726      Allergies: Allergies  Allergen Reactions   Enalapril Swelling    angioedema   Oxycodone Shortness Of Breath   Oxycodone-Acetaminophen  Shortness Of Breath   Ranitidine Hcl Swelling   Aspirin     Hydrochlorothiazide Rash   Penicillins Rash      Past Medical History: Past Medical History:  Diagnosis Date   Asthma    COPD (chronic obstructive pulmonary disease) (HCC)    Diabetes mellitus without complication (HCC)    Hypertension    Morbid obesity with BMI of 50.0-59.9, adult (HCC) 07/13/2019   Stomach ulcer      Past Surgical History: No past surgical history on file.   Family History: Family History  Problem Relation Age of Onset   Hypertension Sister    Diabetes Mellitus II Brother      Social History: Social History   Socioeconomic History   Marital status: Single    Spouse name: Not on file   Number of children: Not on file   Years of education: Not on file   Highest education level: Not on file  Occupational History   Not on file  Tobacco Use   Smoking status:  Former    Types: Cigarettes   Smokeless tobacco: Never  Substance and Sexual Activity   Alcohol use: No   Drug use: No   Sexual activity: Not on file  Other Topics Concern   Not on file  Social History Narrative   Not on file   Social Drivers of Health   Financial Resource Strain: Low Risk (08/12/2023)   Received from [facility]   Overall Financial Resource Strain (CARDIA)    Difficulty of Paying Living Expenses: Not very hard  Food Insecurity: Patient Declined (01/27/2024)   Hunger Vital Sign    Worried About Running Out of Food in the Last Year: Patient declined    Ran Out of Food in the Last Year: Patient declined  Transportation Needs: Patient Declined (01/27/2024)   PRAPARE - Administrator, Civil Service (Medical): Patient declined    Lack of Transportation (Non-Medical): Patient declined  Physical Activity: Not on file  Stress: No Stress Concern Present (11/17/2023)   Received from [facility] - Occupational Stress Questionnaire    Do you feel stress - tense, restless, nervous, or anxious, or unable to sleep at night because your mind is troubled all the time - these days?: Only a little  Social Connections: Patient Declined (01/27/2024)   Social Connection and Isolation Panel    Frequency of Communication with Friends and Family: Patient declined    Frequency of Social Gatherings with Friends and Family: Patient declined    Attends Religious Services: Patient declined    Active Member of Clubs or Organizations: Patient declined    Attends Club or  Organization Meetings: Patient declined    Marital Status: Patient declined  Intimate Partner Violence: Patient Declined (01/27/2024)   Humiliation, Afraid, Rape, and Kick questionnaire    Fear of Current or Ex-Partner: Patient declined    Emotionally Abused: Patient declined    Physically Abused: Patient declined    Sexually Abused: Patient declined     Review  of Systems: Review of Systems  Unable to perform ROS: Critical illness    Vital Signs: Blood pressure 120/71, pulse 84, temperature 98.9 F (37.2 C), temperature source Axillary, resp. rate (!) 22, height 5' 4 (1.626 m), weight (!) 140 kg, SpO2 97%.  Weight trends: Filed Weights   01/26/24 1256 01/28/24 0500  Weight: (!) 136.5 kg (!) 140 kg    Physical Exam: General: Ill-appearing  Head: Normocephalic, atraumatic.   Eyes: Anicteric  Lungs:  Clear to auscultation, BiPAP in place  Heart: Regular rate and rhythm  Abdomen:  Soft, nontender  Extremities: No peripheral edema.  Neurologic: Somnolent  Skin: No lesions  Access: None     Lab results: Basic Metabolic Panel: Recent Labs  Lab 01/26/24 1400 01/27/24 0456 01/27/24 1706 01/28/24 0409  NA 139 138 137 136  K 4.7 5.6* 5.4* 4.9  CL 102 100 99 99  CO2 24 27 26 27   GLUCOSE 133* 102* 80 92  BUN 54* 58* 60* 63*  CREATININE 2.03* 2.59* 2.70* 2.78*  CALCIUM  9.1 9.2 9.3 8.9  MG 2.2  --   --   --     Liver Function Tests: Recent Labs  Lab 01/26/24 1400  AST 16  ALT 8  ALKPHOS 45  BILITOT 0.4  PROT 6.8  ALBUMIN 3.8   No results for input(s): LIPASE, AMYLASE in the last 168 hours. No results for input(s): AMMONIA in the last 168 hours.  CBC: Recent Labs  Lab 01/26/24 1256 01/27/24 0456 01/28/24 0409  WBC 8.5 24.0* 21.6*  NEUTROABS 7.9*  --   --   HGB 10.6* 8.7* 8.1*  HCT 35.0* 29.4* 27.3*  MCV 93.1 96.4 96.5  PLT 325 249 232    Cardiac Enzymes: No results for input(s): CKTOTAL, CKMB, CKMBINDEX, TROPONINI in the last 168 hours.  BNP: Invalid input(s): POCBNP  CBG: Recent Labs  Lab 01/27/24 1610 01/27/24 2051 01/28/24 0804 01/28/24 1151 01/28/24 1655  GLUCAP 80 100* 70 101* 80    Microbiology: Results for orders placed or performed during the hospital encounter of 01/26/24  Resp panel by RT-PCR (RSV, Flu A&B, Covid) Anterior Nasal Swab     Status: None   Collection  Time: 01/26/24 12:56 PM   Specimen: Anterior Nasal Swab  Result Value Ref Range Status   SARS Coronavirus 2 by RT PCR NEGATIVE NEGATIVE Final    Comment: (NOTE) SARS-CoV-2 target nucleic acids are NOT DETECTED.  The SARS-CoV-2 RNA is generally detectable in upper respiratory specimens during the acute phase of infection. The lowest concentration of SARS-CoV-2 viral copies this assay can detect is 138 copies/mL. A negative result does not preclude SARS-Cov-2 infection and should not be used as the sole basis for treatment or other patient management decisions. A negative result may occur with  improper specimen collection/handling, submission of specimen other than nasopharyngeal swab, presence of viral mutation(s) within the areas targeted by this assay, and inadequate number of viral copies(<138 copies/mL). A negative result must be combined with clinical observations, patient history, and epidemiological information. The expected result is Negative.  Fact Sheet for Patients:  bloggercourse.com  Fact Sheet for  Healthcare Providers:  seriousbroker.it  This test is no t yet approved or cleared by the [place]  FDA and  has been authorized for detection and/or diagnosis of SARS-CoV-2 by FDA under an Emergency Use Authorization (EUA). This EUA will remain  in effect (meaning this test can be used) for the duration of the COVID-19 declaration under Section 564(b)(1) of the Act, 21 U.S.C.section 360bbb-3(b)(1), unless the authorization is terminated  or revoked sooner.       Influenza A by PCR NEGATIVE NEGATIVE Final   Influenza B by PCR NEGATIVE NEGATIVE Final    Comment: (NOTE) The Xpert Xpress SARS-CoV-2/FLU/RSV plus assay is intended as an aid in the diagnosis of influenza from Nasopharyngeal swab specimens and should not be used as a sole basis for treatment. Nasal washings and aspirates are unacceptable for Xpert Xpress  SARS-CoV-2/FLU/RSV testing.  Fact Sheet for Patients: bloggercourse.com  Fact Sheet for Healthcare Providers: seriousbroker.it  This test is not yet approved or cleared by the [place]  FDA and has been authorized for detection and/or diagnosis of SARS-CoV-2 by FDA under an Emergency Use Authorization (EUA). This EUA will remain in effect (meaning this test can be used) for the duration of the COVID-19 declaration under Section 564(b)(1) of the Act, 21 U.S.C. section 360bbb-3(b)(1), unless the authorization is terminated or revoked.     Resp Syncytial Virus by PCR NEGATIVE NEGATIVE Final    Comment: (NOTE) Fact Sheet for Patients: bloggercourse.com  Fact Sheet for Healthcare Providers: seriousbroker.it  This test is not yet approved or cleared by the [place]  FDA and has been authorized for detection and/or diagnosis of SARS-CoV-2 by FDA under an Emergency Use Authorization (EUA). This EUA will remain in effect (meaning this test can be used) for the duration of the COVID-19 declaration under Section 564(b)(1) of the Act, 21 U.S.C. section 360bbb-3(b)(1), unless the authorization is terminated or revoked.  Performed at [facility], [address], [place], [place] [address]   Blood Culture (routine x 2)     Status: Abnormal (Preliminary result)   Collection Time: 01/26/24 12:56 PM   Specimen: BLOOD  Result Value Ref Range Status   Specimen Description   Final    BLOOD BLOOD RIGHT FOREARM Performed at [facility], [address], [place], [place] [address]    Special Requests   Final    BOTTLES DRAWN AEROBIC AND ANAEROBIC Blood Culture results may not be optimal due to an inadequate volume of blood received in culture bottles Performed at [facility], [address] [place], [place] [address]    Culture  Setup Time   Final     GRAM NEGATIVE RODS AEROBIC BOTTLE ONLY CRITICAL VALUE NOTED.  VALUE IS CONSISTENT WITH PREVIOUSLY REPORTED AND CALLED VALUE. Performed at [facility], [address] [place], [place] [address]    Culture PSEUDOMONAS AERUGINOSA (A)  Final   Report Status PENDING  Incomplete  Blood Culture (routine x 2)     Status: Abnormal (Preliminary result)   Collection Time: 01/26/24  1:01 PM   Specimen: BLOOD  Result Value Ref Range Status   Specimen Description   Final    BLOOD RIGHT ARM Performed at [facility], [address], [place], [place] [address]    Special Requests   Final    BOTTLES DRAWN AEROBIC AND ANAEROBIC Blood Culture adequate volume Performed at [facility], [address], [place], [place] [address]    Culture  Setup Time  Final    GRAM NEGATIVE RODS AEROBIC BOTTLE ONLY CRITICAL RESULT CALLED TO, READ BACK BY AND VERIFIED WITH:  NATHAN BELUE AT 9458 01/27/24 JG    Culture (A)  Final    PSEUDOMONAS AERUGINOSA SUSCEPTIBILITIES TO FOLLOW Performed at [facility], [address] [place], [place] [address]    Report Status PENDING  Incomplete  Blood Culture ID Panel (Reflexed)     Status: Abnormal   Collection Time: 01/26/24  1:01 PM  Result Value Ref Range Status   Enterococcus faecalis NOT DETECTED NOT DETECTED Final   Enterococcus Faecium NOT DETECTED NOT DETECTED Final   Listeria monocytogenes NOT DETECTED NOT DETECTED Final   Staphylococcus species NOT DETECTED NOT DETECTED Final   Staphylococcus aureus (BCID) NOT DETECTED NOT DETECTED Final   Staphylococcus epidermidis NOT DETECTED NOT DETECTED Final   Staphylococcus lugdunensis NOT DETECTED NOT DETECTED Final   Streptococcus species NOT DETECTED NOT DETECTED Final   Streptococcus agalactiae NOT DETECTED NOT DETECTED Final   Streptococcus pneumoniae NOT DETECTED NOT DETECTED Final   Streptococcus pyogenes NOT DETECTED NOT DETECTED Final   A.calcoaceticus-baumannii NOT  DETECTED NOT DETECTED Final   Bacteroides fragilis NOT DETECTED NOT DETECTED Final   Enterobacterales NOT DETECTED NOT DETECTED Final   Enterobacter cloacae complex NOT DETECTED NOT DETECTED Final   Escherichia coli NOT DETECTED NOT DETECTED Final   Klebsiella aerogenes NOT DETECTED NOT DETECTED Final   Klebsiella oxytoca NOT DETECTED NOT DETECTED Final   Klebsiella pneumoniae NOT DETECTED NOT DETECTED Final   Proteus species NOT DETECTED NOT DETECTED Final   Salmonella species NOT DETECTED NOT DETECTED Final   Serratia marcescens NOT DETECTED NOT DETECTED Final   Haemophilus influenzae NOT DETECTED NOT DETECTED Final   Neisseria meningitidis NOT DETECTED NOT DETECTED Final   Pseudomonas aeruginosa DETECTED (A) NOT DETECTED Final    Comment: CRITICAL RESULT CALLED TO, READ BACK BY AND VERIFIED WITH:  NATHAN BELUE AT 0541 01/27/24 JG    Stenotrophomonas maltophilia NOT DETECTED NOT DETECTED Final   Candida albicans NOT DETECTED NOT DETECTED Final   Candida auris NOT DETECTED NOT DETECTED Final   Candida glabrata NOT DETECTED NOT DETECTED Final   Candida krusei NOT DETECTED NOT DETECTED Final   Candida parapsilosis NOT DETECTED NOT DETECTED Final   Candida tropicalis NOT DETECTED NOT DETECTED Final   Cryptococcus neoformans/gattii NOT DETECTED NOT DETECTED Final   CTX-M ESBL NOT DETECTED NOT DETECTED Final   Carbapenem resistance IMP NOT DETECTED NOT DETECTED Final   Carbapenem resistance KPC NOT DETECTED NOT DETECTED Final   Carbapenem resistance NDM NOT DETECTED NOT DETECTED Final   Carbapenem resistance VIM NOT DETECTED NOT DETECTED Final    Comment: Performed at [facility], [address] [place], [place] [address]    Coagulation Studies: Recent Labs    01/26/24 1256  LABPROT 13.8  INR 1.0    Urinalysis: Recent Labs    01/26/24 1430  COLORURINE STRAW*  LABSPEC 1.009  PHURINE 5.0  GLUCOSEU 150*  HGBUR NEGATIVE  BILIRUBINUR NEGATIVE  KETONESUR NEGATIVE   PROTEINUR 100*  NITRITE NEGATIVE  LEUKOCYTESUR NEGATIVE      Imaging: DG Chest Port 1 View Result Date: 01/28/2024 CLINICAL DATA:  Shortness of breath EXAM: PORTABLE CHEST 1 VIEW COMPARISON:  Chest x-ray 01/26/2024 FINDINGS: Heart is enlarged. There central pulmonary vascular congestion. There some minimal patchy opacities in the right mid lung. The costophrenic angles are clear. No pneumothorax or acute fracture. There are degenerative changes of the left  shoulder. IMPRESSION: 1. Cardiomegaly with central pulmonary vascular congestion. 2. Minimal patchy opacities in the right mid lung may represent atelectasis or infection. Electronically Signed   By: Greig Pique M.D.   On: 01/28/2024 17:49   ECHOCARDIOGRAM COMPLETE Result Date: 01/28/2024    ECHOCARDIOGRAM REPORT   Patient Name:   Kelly Hogan Date of Exam: 01/28/2024 Medical Rec #:  [id number]         Height:       64.0 in Accession #:    [id number]        Weight:       308.6 lb Date of Birth:  January 20, 1941         BSA:          2.353 m Patient Age:    83 years          BP:           117/59 mmHg Patient Gender: F                 HR:           89 bpm. Exam Location:  [facility] Procedure: 2D Echo, Cardiac Doppler and Color Doppler (Both Spectral and Color            Flow Doppler were utilized during procedure). Indications:     Abnormal ECG R94.31  History:         Patient has no prior history of Echocardiogram examinations.                  COPD; Risk Factors:Hypertension and Diabetes.  Sonographer:     Christopher Furnace Referring Phys:  [id number] [facility] Diagnosing Phys: Marsa Dooms MD  Sonographer Comments: Technically challenging study due to limited acoustic windows, patient is obese and no apical window. IMPRESSIONS  1. Left ventricular ejection fraction, by estimation, is 60 to 65%. The left ventricle has normal function. The left ventricle has no regional wall motion abnormalities. Left ventricular diastolic parameters are indeterminate.   2. Right ventricular systolic function is normal. The right ventricular size is normal.  3. The mitral valve is normal in structure. Mild mitral valve regurgitation. No evidence of mitral stenosis.  4. The aortic valve is normal in structure. Aortic valve regurgitation is not visualized. No aortic stenosis is present.  5. The inferior vena cava is normal in size with greater than 50% respiratory variability, suggesting right atrial pressure of 3 mmHg. FINDINGS  Left Ventricle: Left ventricular ejection fraction, by estimation, is 60 to 65%. The left ventricle has normal function. The left ventricle has no regional wall motion abnormalities. Strain was performed and the global longitudinal strain is indeterminate. The left ventricular internal cavity size was normal in size. There is no left ventricular hypertrophy. Left ventricular diastolic parameters are indeterminate. Right Ventricle: The right ventricular size is normal. No increase in right ventricular wall thickness. Right ventricular systolic function is normal. Left Atrium: Left atrial size was normal in size. Right Atrium: Right atrial size was normal in size. Pericardium: There is no evidence of pericardial effusion. Mitral Valve: The mitral valve is normal in structure. Mild mitral valve regurgitation. No evidence of mitral valve stenosis. Tricuspid Valve: The tricuspid valve is normal in structure. Tricuspid valve regurgitation is mild . No evidence of tricuspid stenosis. Aortic Valve: The aortic valve is normal in structure. Aortic valve regurgitation is not visualized. No aortic stenosis is present. Pulmonic Valve: The pulmonic valve was normal in structure. Pulmonic valve regurgitation is  not visualized. No evidence of pulmonic stenosis. Aorta: The aortic root is normal in size and structure. Venous: The inferior vena cava is normal in size with greater than 50% respiratory variability, suggesting right atrial pressure of 3 mmHg. IAS/Shunts: No atrial  level shunt detected by color flow Doppler. Additional Comments: 3D was performed not requiring image post processing on an independent workstation and was indeterminate.  LEFT VENTRICLE PLAX 2D LVIDd:         4.80 cm LVIDs:         2.80 cm LV PW:         1.00 cm LV IVS:        1.10 cm LVOT diam:     2.00 cm LVOT Area:     3.14 cm  LEFT ATRIUM         Index LA diam:    2.90 cm 1.23 cm/m   AORTA Ao Root diam: 2.70 cm  SHUNTS Systemic Diam: 2.00 cm Marsa Dooms MD Electronically signed by Marsa Dooms MD Signature Date/Time: 01/28/2024/1:33:28 PM    Final    US  Venous Img Lower Unilateral Left (DVT) Result Date: 01/27/2024 EXAM: ULTRASOUND DUPLEX OF THE LEFT LOWER EXTREMITY VEINS TECHNIQUE: Duplex ultrasound using B-mode/gray scaled imaging and Doppler spectral analysis and color flow was obtained of the deep venous structures of the left lower extremity. COMPARISON: None available. CLINICAL HISTORY: Swelling. FINDINGS: There is no evidence of deep venous thrombosis within the left lower extremity. The common femoral vein, femoral vein, and popliteal vein of the left lower extremity demonstrate normal compressibility with normal color flow and spectral analysis. There is limited visualization of the left posterior tibial veins and the left peroneal veins are not identified. IMPRESSION: 1. No evidence of DVT in the left lower extremity. 2. Limited visualization of the left posterior tibial veins and the left peroneal veins are not identified. Electronically signed by: Evalene Coho MD 01/27/2024 05:43 AM EST RP Workstation: [id number]     Assessment & Plan: Kelly Hogan is a 83 y.o.  female with  past medical history including hyperlipidemia, type 2 diabetes, hypertension, obesity, and COPD, who was admitted to [facility] on 01/26/2024 for SVT (supraventricular tachycardia) [I47.10] Cellulitis of left lower extremity [L03.116] Sepsis due to cellulitis (HCC) [L03.90, A41.9] Sepsis, due  to unspecified organism, unspecified whether acute organ dysfunction present (HCC) [A41.9]  Acute kidney injury with mild proteinuria on chronic kidney disease stage IV.  Baseline creatinine appears to be 1.83 with GFR 27 on December 08, 2023.  Acute kidney workup in progress.  Suspect there may be a cardiorenal component.  BNP elevated.  Will order renal ultrasound to evaluate obstruction.  1 dose IV Lasix  60 mg ordered by primary team.  IV amiodarone in place for rate control.  No IV contrast exposure.  No immediate indication for dialysis at this time.  Will continue to monitor for now and continue supportive measures.  2. Anemia of chronic kidney disease Lab Results  Component Value Date   HGB 8.1 (L) 01/28/2024    Hemoglobin decreased, 8.1.  Will continue to monitor for now and consider ESA as needed.  3.  Mild hyperkalemia, 5.6 yesterday.  5.4 today.  Can continue management with Lokelma and shifting measures.  4.  Hypotension with history of hypertension.  Likely secondary to infectious process.  All antihypertensives held at this time.  Midodrine ordered per primary team.  LOS: 2 Elisa Sorlie 11/13/20256:30 PM

## 2024-01-28 NOTE — Plan of Care (Addendum)
 Respiratory given ok to trial off Bipap.  Patient was 98% on 4LNC upon removal.  Yet, less than 1 hr, patient was complaining that she couldn't breath and SPO2 was in low 80 and dropping.  Dr. Was rounding on floor and aware - Bipap placed back on for further treatments. SPO2 97% on Bipap 41%.FIO2  1315 - BP 93/42.  Dr. Informed  - LR bolus ordered and hung

## 2024-01-28 NOTE — Progress Notes (Signed)
*  PRELIMINARY RESULTS* Echocardiogram 2D Echocardiogram has been performed.  Floydene Harder 01/28/2024, 8:50 AM

## 2024-01-28 NOTE — Progress Notes (Signed)
 PROGRESS NOTE    Kelly Hogan   FMW:[id number] DOB: 1940/06/05  DOA: 01/26/2024 Date of Service: 01/28/24 which is hospital day 2  PCP: [name] [name] LABOR, MD    Hospital course / significant events:   HPI: Kelly Hogan is a 83 y.o. 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    11/11: admitted to hospitalist w/ hypotension, cellulitis. metronidazole and cefepime in the ED. SVT with rates of 140s, EDP discussed with cardiology Dr Ammon who recommended metoprolol and if did not respond then start amiodarone, pt requiring amio gtt.   11/12: (+)Pseudomonas bacteremia - linezolid and vancomycin. Improved rate but remains on amio gtt for now, d/w cardiology they will see in AM. Was off amio briefly this evening d/t limited IV access and needed other Rx, HR was WNL but then HR back up again and back on the drip. Renal fxn worse, resp acidosis and hypercarbia, pt placed on BiPAP. Fluids changed to bicarb infusion 1L. Hyperkalemia, mild, gave K shifting Rx w/ insulin/D50, also admin Lokelma.  11/13: renal fxn continuing to worsen - Nephrology consulted. Pt remains on BiPAP this morning. Was off briefly per her request but O2 down and SOB so placed back on the BiPAP. BP soft, gave another 1L fluids. Cardiology - transition to po amio, starting eliquis. ID recs pending     Consultants:  Cardiology  Infectious disease   Procedures/Surgeries: none      ASSESSMENT & PLAN:   Cellulitis of the left lower extremity. Present on admission and ruled out DVT (though US  exam somewhat limited) (+)Pseudomonas bacteremia Linezolid and vancomycin Monitor cultures --> pending susceptibilities  trend leukocyte count and fever curve.   ID consult  for bacteremia    SVT:  Amio drip --> po today Cardiology to follow  Echo pending      Acte on Chronic hypoxic/hypercarbic respiratory failure secondary to COPD and OHS:  Respiratory acidosia / mized metabolic acidosis  Resp support escalated to BIPAP, has had to be on this continuously   AKI on CKD 3B Hyperkalemia - resolved Caution w/ IVF given edema Nephrology to follow   Hx Essential Hypertension Hypotensive secondary to infection above.   Responding some to IV fluids and amio controlling HR Midodrine added hold antihypertensives.   Type 2 diabetes Monitor glc Relatively low glc, will hold on SSI for now   Hyperlipidemia:  statin   Chronic venous stasis Rec follow w/ lymphedema clinic   Skin nodularity bilateral lower extremities Question hyperkeratotic lesions? Keloid type lesions?  See photos Appear benign but will complicate hygiene - rec clean w/ lukewarm water and mild soap and rinse thoroughly for now, wound care consult    Super Morbid Obesity based on BMI: Body mass index is 51.67 kg/m.SABRA Significantly low or high BMI is associated with higher medical risk.  Underweight - under 18  overweight - 25 to 29 obese - 30 or more Class 1 obesity: BMI of 30.0 to 34 Class 2 obesity: BMI of 35.0 to 39 Class 3 obesity: BMI of 40.0 to 49 Super Morbid Obesity: BMI 50-59 Super-super Morbid Obesity: BMI 60+ Healthy nutrition and physical activity advised as adjunct to other disease management and risk reduction treatments    DVT prophylaxis: lovenox  IV  fluids: bolus today w/ soft BP Nutrition: carb  Central lines / other devices: none  Code Status: FULL CODE ACP documentation reviewed: none on file in [facility]  TOC needs: TBD Medical barriers to dispo: IV abx and bacteremia. Expected medical readiness for discharge several days at least.              Subjective / Brief ROS:  Patient reports feeling SOB without the BiPAP, she had requested it removed  about 30-40 mins prior to me seeing her on rounds.  Denies CP   Family Communication: call to son Darold to give update - 01/28/24 2:06 PM - update given, he will also be there this afternoon to visit         Objective Findings:  Vitals:   01/28/24 1145 01/28/24 1221 01/28/24 1226 01/28/24 1315  BP:    (!) 93/42  Pulse:      Resp:      Temp:      TempSrc:      SpO2: 98% (!) 82% 97%   Weight:      Height:        Intake/Output Summary (Last 24 hours) at 01/28/2024 1406 Last data filed at 01/27/2024 1751 Gross per 24 hour  Intake 387.94 ml  Output --  Net 387.94 ml   Filed Weights   01/26/24 1256 01/28/24 0500  Weight: (!) 136.5 kg (!) 140 kg    Examination:  Physical Exam Constitutional:      General: She is not in acute distress.    Appearance: She is ill-appearing.  Cardiovascular:     Rate and Rhythm: Normal rate and regular rhythm.  Pulmonary:     Effort: Tachypnea present. No respiratory distress.     Breath sounds: Decreased air movement present.  Musculoskeletal:     Right lower leg: Edema present.     Left lower leg: Edema present.  Skin:    Comments: See photos  Neurological:     Mental Status: She is alert and oriented to person, place, and time. Mental status is at baseline.  Psychiatric:        Mood and Affect: Mood normal.        Behavior: Behavior normal.       01/27/24 - erythema improved     01/26/24     Scheduled Medications:   apixaban  2.5 mg Oral BID   budesonide-glycopyrrolate-formoterol  2 puff Inhalation BID   Influenza vac split trivalent PF  0.5 mL Intramuscular Tomorrow-1000   midodrine  5 mg Oral TID WC   pantoprazole   40 mg Oral Daily   pravastatin  20 mg Oral QPM   sodium chloride  flush  3 mL Intravenous Q12H    Continuous Infusions:  amiodarone 30 mg/hr (01/28/24 1241)   ceFEPime (MAXIPIME) IV 2 g (01/28/24 1131)   linezolid (ZYVOX) IV 600 mg (01/28/24 1131)    PRN Medications:  acetaminophen  **OR**  acetaminophen , senna-docusate  Antimicrobials from admission:  Anti-infectives (From admission, onward)    Start     Dose/Rate Route Frequency Ordered Stop   01/28/24 1600  vancomycin (VANCOCIN) IVPB 1000 mg/200 mL premix  Status:  Discontinued       Placed in Followed by Linked Group   1,000 mg 200 mL/hr over 60 Minutes Intravenous Every 36 hours 01/27/24 0241 01/27/24 1045   01/27/24 2200  linezolid (ZYVOX) IVPB 600 mg        600 mg 300 mL/hr over 60 Minutes Intravenous Every 12 hours 01/27/24  1046     01/27/24 1200  ceFEPIme (MAXIPIME) 2 g in sodium chloride  0.9 % 100 mL IVPB        2 g 200 mL/hr over 30 Minutes Intravenous Every 24 hours 01/27/24 0228 02/02/24 1159   01/27/24 0400  vancomycin (VANCOREADY) IVPB 2000 mg/400 mL       Placed in Followed by Linked Group   2,000 mg 200 mL/hr over 120 Minutes Intravenous  Once 01/27/24 0241 01/27/24 0713   01/27/24 0100  ceFAZolin (ANCEF) IVPB 2g/100 mL premix  Status:  Discontinued        2 g 200 mL/hr over 30 Minutes Intravenous Every 8 hours 01/27/24 0037 01/27/24 0042   01/26/24 1300  ceFEPIme (MAXIPIME) 2 g in sodium chloride  0.9 % 100 mL IVPB        2 g 200 mL/hr over 30 Minutes Intravenous  Once 01/26/24 1256 01/26/24 1350   01/26/24 1300  metroNIDAZOLE (FLAGYL) IVPB 500 mg        500 mg 100 mL/hr over 60 Minutes Intravenous  Once 01/26/24 1256 01/26/24 1559   01/26/24 1300  vancomycin (VANCOCIN) IVPB 1000 mg/200 mL premix        1,000 mg 200 mL/hr over 60 Minutes Intravenous  Once 01/26/24 1256 01/26/24 1458           Data Reviewed:  I have personally reviewed the following...  CBC: Recent Labs  Lab 01/26/24 1256 01/27/24 0456 01/28/24 0409  WBC 8.5 24.0* 21.6*  NEUTROABS 7.9*  --   --   HGB 10.6* 8.7* 8.1*  HCT 35.0* 29.4* 27.3*  MCV 93.1 96.4 96.5  PLT 325 249 232   Basic Metabolic Panel: Recent Labs  Lab 01/26/24 1400 01/27/24 0456 01/27/24 1706 01/28/24 0409  NA 139 138 137 136  K 4.7 5.6*  5.4* 4.9  CL 102 100 99 99  CO2 24 27 26 27   GLUCOSE 133* 102* 80 92  BUN 54* 58* 60* 63*  CREATININE 2.03* 2.59* 2.70* 2.78*  CALCIUM  9.1 9.2 9.3 8.9  MG 2.2  --   --   --    GFR: Estimated Creatinine Clearance: 21.9 mL/min (A) (by C-G formula based on SCr of 2.78 mg/dL (H)). Liver Function Tests: Recent Labs  Lab 01/26/24 1400  AST 16  ALT 8  ALKPHOS 45  BILITOT 0.4  PROT 6.8  ALBUMIN 3.8   No results for input(s): LIPASE, AMYLASE in the last 168 hours. No results for input(s): AMMONIA in the last 168 hours. Coagulation Profile: Recent Labs  Lab 01/26/24 1256  INR 1.0   Cardiac Enzymes: No results for input(s): CKTOTAL, CKMB, CKMBINDEX, TROPONINI in the last 168 hours. BNP (last 3 results) Recent Labs    01/26/24 1256  PROBNP 121.0   HbA1C: No results for input(s): HGBA1C in the last 72 hours. CBG: Recent Labs  Lab 01/27/24 1433 01/27/24 1610 01/27/24 2051 01/28/24 0804 01/28/24 1151  GLUCAP 81 80 100* 70 101*   Lipid Profile: No results for input(s): CHOL, HDL, LDLCALC, TRIG, CHOLHDL, LDLDIRECT in the last 72 hours. Thyroid Function Tests: Recent Labs    01/26/24 1256  TSH 5.650*   Anemia Panel: No results for input(s): VITAMINB12, FOLATE, FERRITIN, TIBC, IRON, RETICCTPCT in the last 72 hours. Most Recent Urinalysis On File:     Component Value Date/Time   COLORURINE STRAW (A) 01/26/2024 1430   APPEARANCEUR CLEAR (A) 01/26/2024 1430   APPEARANCEUR Cloudy 10/14/2012 2100   LABSPEC 1.009 01/26/2024 1430  LABSPEC 1.016 10/14/2012 2100   PHURINE 5.0 01/26/2024 1430   GLUCOSEU 150 (A) 01/26/2024 1430   GLUCOSEU Negative 10/14/2012 2100   HGBUR NEGATIVE 01/26/2024 1430   BILIRUBINUR NEGATIVE 01/26/2024 1430   BILIRUBINUR Negative 10/14/2012 2100   KETONESUR NEGATIVE 01/26/2024 1430   PROTEINUR 100 (A) 01/26/2024 1430   NITRITE NEGATIVE 01/26/2024 1430   LEUKOCYTESUR NEGATIVE 01/26/2024 1430    LEUKOCYTESUR Negative 10/14/2012 2100   Sepsis Labs: @LABRCNTIP (procalcitonin:4,lacticidven:4) Microbiology: Recent Results (from the past 240 hours)  Resp panel by RT-PCR (RSV, Flu A&B, Covid) Anterior Nasal Swab     Status: None   Collection Time: 01/26/24 12:56 PM   Specimen: Anterior Nasal Swab  Result Value Ref Range Status   SARS Coronavirus 2 by RT PCR NEGATIVE NEGATIVE Final    Comment: (NOTE) SARS-CoV-2 target nucleic acids are NOT DETECTED.  The SARS-CoV-2 RNA is generally detectable in upper respiratory specimens during the acute phase of infection. The lowest concentration of SARS-CoV-2 viral copies this assay can detect is 138 copies/mL. A negative result does not preclude SARS-Cov-2 infection and should not be used as the sole basis for treatment or other patient management decisions. A negative result may occur with  improper specimen collection/handling, submission of specimen other than nasopharyngeal swab, presence of viral mutation(s) within the areas targeted by this assay, and inadequate number of viral copies(<138 copies/mL). A negative result must be combined with clinical observations, patient history, and epidemiological information. The expected result is Negative.  Fact Sheet for Patients:  bloggercourse.com  Fact Sheet for Healthcare Providers:  seriousbroker.it  This test is no t yet approved or cleared by the [place]  FDA and  has been authorized for detection and/or diagnosis of SARS-CoV-2 by FDA under an Emergency Use Authorization (EUA). This EUA will remain  in effect (meaning this test can be used) for the duration of the COVID-19 declaration under Section 564(b)(1) of the Act, 21 U.S.C.section 360bbb-3(b)(1), unless the authorization is terminated  or revoked sooner.       Influenza A by PCR NEGATIVE NEGATIVE Final   Influenza B by PCR NEGATIVE NEGATIVE Final    Comment: (NOTE) The  Xpert Xpress SARS-CoV-2/FLU/RSV plus assay is intended as an aid in the diagnosis of influenza from Nasopharyngeal swab specimens and should not be used as a sole basis for treatment. Nasal washings and aspirates are unacceptable for Xpert Xpress SARS-CoV-2/FLU/RSV testing.  Fact Sheet for Patients: bloggercourse.com  Fact Sheet for Healthcare Providers: seriousbroker.it  This test is not yet approved or cleared by the [place]  FDA and has been authorized for detection and/or diagnosis of SARS-CoV-2 by FDA under an Emergency Use Authorization (EUA). This EUA will remain in effect (meaning this test can be used) for the duration of the COVID-19 declaration under Section 564(b)(1) of the Act, 21 U.S.C. section 360bbb-3(b)(1), unless the authorization is terminated or revoked.     Resp Syncytial Virus by PCR NEGATIVE NEGATIVE Final    Comment: (NOTE) Fact Sheet for Patients: bloggercourse.com  Fact Sheet for Healthcare Providers: seriousbroker.it  This test is not yet approved or cleared by the [place]  FDA and has been authorized for detection and/or diagnosis of SARS-CoV-2 by FDA under an Emergency Use Authorization (EUA). This EUA will remain in effect (meaning this test can be used) for the duration of the COVID-19 declaration under Section 564(b)(1) of the Act, 21 U.S.C. section 360bbb-3(b)(1), unless the authorization is terminated or revoked.  Performed at [facility], [address]  [place], [place] [address]   Blood Culture (routine x 2)     Status: Abnormal (Preliminary result)   Collection Time: 01/26/24 12:56 PM   Specimen: BLOOD  Result Value Ref Range Status   Specimen Description   Final    BLOOD BLOOD RIGHT FOREARM Performed at [facility], [address] [place], [place] [address]    Special Requests   Final    BOTTLES DRAWN  AEROBIC AND ANAEROBIC Blood Culture results may not be optimal due to an inadequate volume of blood received in culture bottles Performed at [facility], [address], [place], [place] [address]    Culture  Setup Time   Final    GRAM NEGATIVE RODS AEROBIC BOTTLE ONLY CRITICAL VALUE NOTED.  VALUE IS CONSISTENT WITH PREVIOUSLY REPORTED AND CALLED VALUE. Performed at [facility], [address] [place], [place] [address]    Culture PSEUDOMONAS AERUGINOSA (A)  Final   Report Status PENDING  Incomplete  Blood Culture (routine x 2)     Status: Abnormal (Preliminary result)   Collection Time: 01/26/24  1:01 PM   Specimen: BLOOD  Result Value Ref Range Status   Specimen Description   Final    BLOOD RIGHT ARM Performed at [facility], [address], [place], [place] [address]    Special Requests   Final    BOTTLES DRAWN AEROBIC AND ANAEROBIC Blood Culture adequate volume Performed at [facility], [address] [place], [place] [address]    Culture  Setup Time   Final    GRAM NEGATIVE RODS AEROBIC BOTTLE ONLY CRITICAL RESULT CALLED TO, READ BACK BY AND VERIFIED WITH:  NATHAN BELUE AT 9458 01/27/24 JG    Culture (A)  Final    PSEUDOMONAS AERUGINOSA SUSCEPTIBILITIES TO FOLLOW Performed at [facility], [address] [place], [place] [address]    Report Status PENDING  Incomplete  Blood Culture ID Panel (Reflexed)     Status: Abnormal   Collection Time: 01/26/24  1:01 PM  Result Value Ref Range Status   Enterococcus faecalis NOT DETECTED NOT DETECTED Final   Enterococcus Faecium NOT DETECTED NOT DETECTED Final   Listeria monocytogenes NOT DETECTED NOT DETECTED Final   Staphylococcus species NOT DETECTED NOT DETECTED Final   Staphylococcus aureus (BCID) NOT DETECTED NOT DETECTED Final   Staphylococcus epidermidis NOT DETECTED NOT DETECTED Final   Staphylococcus lugdunensis NOT DETECTED NOT DETECTED Final   Streptococcus species  NOT DETECTED NOT DETECTED Final   Streptococcus agalactiae NOT DETECTED NOT DETECTED Final   Streptococcus pneumoniae NOT DETECTED NOT DETECTED Final   Streptococcus pyogenes NOT DETECTED NOT DETECTED Final   A.calcoaceticus-baumannii NOT DETECTED NOT DETECTED Final   Bacteroides fragilis NOT DETECTED NOT DETECTED Final   Enterobacterales NOT DETECTED NOT DETECTED Final   Enterobacter cloacae complex NOT DETECTED NOT DETECTED Final   Escherichia coli NOT DETECTED NOT DETECTED Final   Klebsiella aerogenes NOT DETECTED NOT DETECTED Final   Klebsiella oxytoca NOT DETECTED NOT DETECTED Final   Klebsiella pneumoniae NOT DETECTED NOT DETECTED Final   Proteus species NOT DETECTED NOT DETECTED Final   Salmonella species NOT DETECTED NOT DETECTED Final   Serratia marcescens NOT DETECTED NOT DETECTED Final   Haemophilus influenzae NOT DETECTED NOT DETECTED Final   Neisseria meningitidis NOT DETECTED NOT DETECTED Final   Pseudomonas aeruginosa DETECTED (A) NOT DETECTED Final    Comment: CRITICAL RESULT CALLED TO, READ BACK BY AND VERIFIED WITH:  NATHAN BELUE AT 0541  01/27/24 JG    Stenotrophomonas maltophilia NOT DETECTED NOT DETECTED Final   Candida albicans NOT DETECTED NOT DETECTED Final   Candida auris NOT DETECTED NOT DETECTED Final   Candida glabrata NOT DETECTED NOT DETECTED Final   Candida krusei NOT DETECTED NOT DETECTED Final   Candida parapsilosis NOT DETECTED NOT DETECTED Final   Candida tropicalis NOT DETECTED NOT DETECTED Final   Cryptococcus neoformans/gattii NOT DETECTED NOT DETECTED Final   CTX-M ESBL NOT DETECTED NOT DETECTED Final   Carbapenem resistance IMP NOT DETECTED NOT DETECTED Final   Carbapenem resistance KPC NOT DETECTED NOT DETECTED Final   Carbapenem resistance NDM NOT DETECTED NOT DETECTED Final   Carbapenem resistance VIM NOT DETECTED NOT DETECTED Final    Comment: Performed at [facility], [address], [place], [place] [address]      Radiology  Studies last 3 days: ECHOCARDIOGRAM COMPLETE Result Date: 01/28/2024    ECHOCARDIOGRAM REPORT   Patient Name:   LARETA BRUNEAU Date of Exam: 01/28/2024 Medical Rec #:  [id number]         Height:       64.0 in Accession #:    [id number]        Weight:       308.6 lb Date of Birth:  12/17/1940         BSA:          2.353 m Patient Age:    83 years          BP:           117/59 mmHg Patient Gender: F                 HR:           89 bpm. Exam Location:  [facility] Procedure: 2D Echo, Cardiac Doppler and Color Doppler (Both Spectral and Color            Flow Doppler were utilized during procedure). Indications:     Abnormal ECG R94.31  History:         Patient has no prior history of Echocardiogram examinations.                  COPD; Risk Factors:Hypertension and Diabetes.  Sonographer:     Christopher Furnace Referring Phys:  [id number] [facility] Diagnosing Phys: Marsa Dooms MD  Sonographer Comments: Technically challenging study due to limited acoustic windows, patient is obese and no apical window. IMPRESSIONS  1. Left ventricular ejection fraction, by estimation, is 60 to 65%. The left ventricle has normal function. The left ventricle has no regional wall motion abnormalities. Left ventricular diastolic parameters are indeterminate.  2. Right ventricular systolic function is normal. The right ventricular size is normal.  3. The mitral valve is normal in structure. Mild mitral valve regurgitation. No evidence of mitral stenosis.  4. The aortic valve is normal in structure. Aortic valve regurgitation is not visualized. No aortic stenosis is present.  5. The inferior vena cava is normal in size with greater than 50% respiratory variability, suggesting right atrial pressure of 3 mmHg. FINDINGS  Left Ventricle: Left ventricular ejection fraction, by estimation, is 60 to 65%. The left ventricle has normal function. The left ventricle has no regional wall motion abnormalities. Strain was performed and the global longitudinal  strain is indeterminate. The left ventricular internal cavity size was normal in size. There is no left ventricular hypertrophy. Left ventricular diastolic parameters are indeterminate. Right Ventricle: The right ventricular size is  normal. No increase in right ventricular wall thickness. Right ventricular systolic function is normal. Left Atrium: Left atrial size was normal in size. Right Atrium: Right atrial size was normal in size. Pericardium: There is no evidence of pericardial effusion. Mitral Valve: The mitral valve is normal in structure. Mild mitral valve regurgitation. No evidence of mitral valve stenosis. Tricuspid Valve: The tricuspid valve is normal in structure. Tricuspid valve regurgitation is mild . No evidence of tricuspid stenosis. Aortic Valve: The aortic valve is normal in structure. Aortic valve regurgitation is not visualized. No aortic stenosis is present. Pulmonic Valve: The pulmonic valve was normal in structure. Pulmonic valve regurgitation is not visualized. No evidence of pulmonic stenosis. Aorta: The aortic root is normal in size and structure. Venous: The inferior vena cava is normal in size with greater than 50% respiratory variability, suggesting right atrial pressure of 3 mmHg. IAS/Shunts: No atrial level shunt detected by color flow Doppler. Additional Comments: 3D was performed not requiring image post processing on an independent workstation and was indeterminate.  LEFT VENTRICLE PLAX 2D LVIDd:         4.80 cm LVIDs:         2.80 cm LV PW:         1.00 cm LV IVS:        1.10 cm LVOT diam:     2.00 cm LVOT Area:     3.14 cm  LEFT ATRIUM         Index LA diam:    2.90 cm 1.23 cm/m   AORTA Ao Root diam: 2.70 cm  SHUNTS Systemic Diam: 2.00 cm Marsa Dooms MD Electronically signed by Marsa Dooms MD Signature Date/Time: 01/28/2024/1:33:28 PM    Final    US  Venous Img Lower Unilateral Left (DVT) Result Date: 01/27/2024 EXAM: ULTRASOUND DUPLEX OF THE LEFT LOWER  EXTREMITY VEINS TECHNIQUE: Duplex ultrasound using B-mode/gray scaled imaging and Doppler spectral analysis and color flow was obtained of the deep venous structures of the left lower extremity. COMPARISON: None available. CLINICAL HISTORY: Swelling. FINDINGS: There is no evidence of deep venous thrombosis within the left lower extremity. The common femoral vein, femoral vein, and popliteal vein of the left lower extremity demonstrate normal compressibility with normal color flow and spectral analysis. There is limited visualization of the left posterior tibial veins and the left peroneal veins are not identified. IMPRESSION: 1. No evidence of DVT in the left lower extremity. 2. Limited visualization of the left posterior tibial veins and the left peroneal veins are not identified. Electronically signed by: Evalene Coho MD 01/27/2024 05:43 AM EST RP Workstation: [id number]   DG Chest Port 1 View Result Date: 01/26/2024 CLINICAL DATA:  Questionable sepsis EXAM: PORTABLE CHEST 1 VIEW COMPARISON:  Chest radiograph dated 07/07/2019. FINDINGS: No focal consolidation, pleural effusion or pneumothorax. Top-normal cardiac size. No acute osseous pathology. IMPRESSION: No active disease. Electronically Signed   By: Vanetta Chou M.D.   On: 01/26/2024 13:58        Charell Faulk, DO [facility] 01/28/2024, 2:06 PM    Dictation software may have been used to generate the above note. Typos may occur and escape review in typed/dictated notes. Please contact Dr Marsa directly for clarity if needed.  Staff may message me via secure chat in Epic  but this may not receive an immediate response,  please page me for urgent matters!  If 7PM-7AM, please contact night coverage [email or web address]

## 2024-01-28 NOTE — Care Management Important Message (Signed)
 Important Message  Patient Details  Name: Kelly Hogan MRN: [id number] Date of Birth: Nov 16, 1940   Important Message Given:  Yes - Medicare IM     Kelly Hogan 01/28/2024, 2:38 PM

## 2024-01-28 NOTE — Consult Note (Signed)
 NAME: Kelly Hogan  DOB: September 24, 1940  MRN: [id number]  Date/Time: 01/28/2024 9:04 PM  REQUESTING PROVIDER: Dr.Alexander Subjective:  REASON FOR CONSULT: Pseudomonas bacteremia ?No History from patient Chart reviewed in detail Spok[place] her son at bed side  Kelly Hogan is a 83 y.o. with a history of HFpEF, OSA, morbid obesity, HTN, DM, COPD on oxygen at home, chronic lymphedema legs presented to the ED brought in by EMS from home with fever and chills of 2 days - She had a day before done a telemed visit and diagnosed as cellulitis legs and was prescribed antibiotics which she had not collected  01/26/24 13:02  BP 169/74 !  Temp 100.2 F (37.9 C)  Pulse Rate 124 !  Resp 25 !  SpO2 100 %  O2 Flow Rate (L/min) 4 L/min    Latest Reference Range & Units 01/26/24 12:56  WBC 4.0 - 10.5 K/uL 8.5  Hemoglobin 12.0 - 15.0 g/dL 89.3 (L)  HCT 63.9 - 53.9 % 35.0 (L)  Platelets 150 - 400 K/uL 325  Cr 2.03 Blood culture sent and she was started on cefepime and linezolid Blood culture came positive for pseudomonas and I am seeing the patient for the same  As per son , pt at baseline is house bound- She can walk to the toilet, she has home help for a few hours and Som helps her She has had chronic lymphedema and changes in the legs from that Past Medical History:  Diagnosis Date   Asthma    COPD (chronic obstructive pulmonary disease) (HCC)    Diabetes mellitus without complication (HCC)    Hypertension    Morbid obesity with BMI of 50.0-59.9, adult (HCC) 07/13/2019   Stomach ulcer     Past surgical history C-section Cataract surgery Social History   Socioeconomic History   Marital status: Single    Spouse name: Not on file   Number of children: Not on file   Years of education: Not on file   Highest education level: Not on file  Occupational History   Not on file  Tobacco Use   Smoking status: Former    Types: Cigarettes   Smokeless tobacco: Never  Substance and Sexual  Activity   Alcohol use: No   Drug use: No   Sexual activity: Not on file  Other Topics Concern   Not on file  Social History Narrative   Not on file   Social Drivers of Health   Financial Resource Strain: Low Risk (08/12/2023)   [facility] from [facility]   Overall Financial Resource Strain (CARDIA)    Difficulty of Paying Living Expenses: Not very hard  Food Insecurity: Patient Declined (01/27/2024)   Hunger Vital Sign    Worried About Running Out of Food in the Last Year: Patient declined    Ran Out of Food in the Last Year: Patient declined  Transportation Needs: Patient Declined (01/27/2024)   PRAPARE - Administrator, Civil Service (Medical): Patient declined    Lack of Transportation (Non-Medical): Patient declined  Physical Activity: Not on file  Stress: No Stress Concern Present (11/17/2023)   Received from [facility] of [facility] - Occupational Stress Questionnaire    Do you feel stress - tense, restless, nervous, or anxious, or unable to sleep at night because your mind is troubled all the time - these days?: Only a little  Social Connections: Patient Declined (01/27/2024)   Social Connection and Isolation Panel  Frequency of Communication with Friends and Family: Patient declined    Frequency of Social Gatherings with Friends and Family: Patient declined    Attends Religious Services: Patient declined    Database Administrator or Organizations: Patient declined    Attends Banker Meetings: Patient declined    Marital Status: Patient declined  Intimate Partner Violence: Patient Declined (01/27/2024)   Humiliation, Afraid, Rape, and Kick questionnaire    Fear of Current or Ex-Partner: Patient declined    Emotionally Abused: Patient declined    Physically Abused: Patient declined    Sexually Abused: Patient declined    Family History  Problem Relation Age of Onset   Hypertension Sister    Diabetes  Mellitus II Brother    Allergies  Allergen Reactions   Enalapril Swelling    angioedema   Oxycodone Shortness Of Breath   Oxycodone-Acetaminophen  Shortness Of Breath   Ranitidine Hcl Swelling   Aspirin     Hydrochlorothiazide Rash   Penicillins Rash   I? Current Facility-Administered Medications  Medication Dose Route Frequency Provider Last Rate Last Admin   acetaminophen  (TYLENOL ) tablet 650 mg  650 mg Oral Q6H PRN Fernand Prost, MD       Or   acetaminophen  (TYLENOL ) suppository 650 mg  650 mg Rectal Q6H PRN Khan, Ghalib, MD   650 mg at 01/27/24 0335   amiodarone (NEXTERONE PREMIX) 360-4.14 MG/200ML-% (1.8 mg/mL) IV infusion  30 mg/hr Intravenous Continuous Hudson, Caralyn, PA-C 16.67 mL/hr at 01/28/24 1551 30 mg/hr at 01/28/24 1551   apixaban (ELIQUIS) tablet 2.5 mg  2.5 mg Oral BID Hudson, Caralyn, PA-C       budesonide-glycopyrrolate-formoterol (BREZTRI) 160-9-4.8 MCG/ACT inhaler 2 puff  2 puff Inhalation BID Khan, Ghalib, MD   2 puff at 01/27/24 0920   [START ON 01/29/2024] cefTAZidime (FORTAZ) 2 g in sodium chloride  0.9 % 100 mL IVPB  2 g Intravenous Q24H Fayette Bodily, MD       furosemide  (LASIX ) 10 MG/ML injection            [START ON 01/29/2024] furosemide  (LASIX ) injection 60 mg  60 mg Intravenous Once Alexander, Natalie, DO       Influenza vac split trivalent PF (FLUZONE HIGH-DOSE) injection 0.5 mL  0.5 mL Intramuscular Tomorrow-1000 Alexander, Natalie, DO       midodrine (PROAMATINE) tablet 5 mg  5 mg Oral TID WC Alexander, Natalie, DO   5 mg at 01/28/24 1725   pantoprazole  (PROTONIX ) EC tablet 40 mg  40 mg Oral Daily Khan, Ghalib, MD   40 mg at 01/27/24 0919   pravastatin (PRAVACHOL) tablet 20 mg  20 mg Oral QPM Khan, Ghalib, MD   20 mg at 01/28/24 1725   senna-docusate (Senokot-S) tablet 1 tablet  1 tablet Oral QHS PRN Fernand Prost, MD       sodium chloride  flush (NS) 0.9 % injection 3 mL  3 mL Intravenous Q12H Khan, Ghalib, MD   3 mL at 01/28/24 2059     Abtx:   Anti-infectives (From admission, onward)    Start     Dose/Rate Route Frequency Ordered Stop   01/29/24 1000  cefTAZidime (FORTAZ) 2 g in sodium chloride  0.9 % 100 mL IVPB        2 g 200 mL/hr over 30 Minutes Intravenous Every 24 hours 01/28/24 1651     01/28/24 1600  vancomycin (VANCOCIN) IVPB 1000 mg/200 mL premix  Status:  Discontinued       Placed in Followed by Linked  Group   1,000 mg 200 mL/hr over 60 Minutes Intravenous Every 36 hours 01/27/24 0241 01/27/24 1045   01/27/24 2200  linezolid (ZYVOX) IVPB 600 mg  Status:  Discontinued        600 mg 300 mL/hr over 60 Minutes Intravenous Every 12 hours 01/27/24 1046 01/28/24 1618   01/27/24 1200  ceFEPIme (MAXIPIME) 2 g in sodium chloride  0.9 % 100 mL IVPB  Status:  Discontinued        2 g 200 mL/hr over 30 Minutes Intravenous Every 24 hours 01/27/24 0228 01/28/24 1651   01/27/24 0400  vancomycin (VANCOREADY) IVPB 2000 mg/400 mL       Placed in Followed by Linked Group   2,000 mg 200 mL/hr over 120 Minutes Intravenous  Once 01/27/24 0241 01/27/24 0713   01/27/24 0100  ceFAZolin (ANCEF) IVPB 2g/100 mL premix  Status:  Discontinued        2 g 200 mL/hr over 30 Minutes Intravenous Every 8 hours 01/27/24 0037 01/27/24 0042   01/26/24 1300  ceFEPIme (MAXIPIME) 2 g in sodium chloride  0.9 % 100 mL IVPB        2 g 200 mL/hr over 30 Minutes Intravenous  Once 01/26/24 1256 01/26/24 1350   01/26/24 1300  metroNIDAZOLE (FLAGYL) IVPB 500 mg        500 mg 100 mL/hr over 60 Minutes Intravenous  Once 01/26/24 1256 01/26/24 1559   01/26/24 1300  vancomycin (VANCOCIN) IVPB 1000 mg/200 mL premix        1,000 mg 200 mL/hr over 60 Minutes Intravenous  Once 01/26/24 1256 01/26/24 1458       REVIEW OF SYSTEMS:  NA She is on BIPAP Objective:  VITALS:  BP (!) 131/54   Pulse 84   Temp 98.8 F (37.1 C) (Axillary)   Resp (!) 22   Ht 5' 4 (1.626 m)   Wt (!) 140 kg   LMP  (LMP Unknown)   SpO2 97%   BMI 52.98 kg/m   PHYSICAL EXAM:  somnolent, on BIPAP  Head: Normocephalic, without obvious abnormality, atraumatic. Eyes: Did not examine ENT Did not examine Unable to examine Supple, symmetrical, no adenopathy, thyroid: non tender no carotid bruit and no JVD. Back: healed hypopigmented scars over buttocks and low back Lungs: b/l air entry Heart: Tachycardia Abdomen: Soft, obese Extremities: legs edematous Chronic verrucous changes with fleshy fibroma like lesions due to the lymphedema '        Skin: as above Lymph: Cervical, supraclavicular normal. Neurologic: myoclonic jerks of upper extremities Pertinent Labs Lab Results CBC    Component Value Date/Time   WBC 21.6 (H) 01/28/2024 0409   RBC 2.83 (L) 01/28/2024 0409   HGB 8.1 (L) 01/28/2024 0409   HGB 11.0 (L) 10/17/2012 0303   HCT 27.3 (L) 01/28/2024 0409   HCT 32.3 (L) 10/17/2012 0303   PLT 232 01/28/2024 0409   PLT 272 10/17/2012 0303   MCV 96.5 01/28/2024 0409   MCV 79 (L) 10/17/2012 0303   MCH 28.6 01/28/2024 0409   MCHC 29.7 (L) 01/28/2024 0409   RDW 13.9 01/28/2024 0409   RDW 15.0 (H) 10/17/2012 0303   LYMPHSABS 0.4 (L) 01/26/2024 1256   LYMPHSABS 1.3 10/17/2012 0303   MONOABS 0.1 01/26/2024 1256   MONOABS 0.7 10/17/2012 0303   EOSABS 0.1 01/26/2024 1256   EOSABS 0.1 10/17/2012 0303   BASOSABS 0.0 01/26/2024 1256   BASOSABS 0.0 10/17/2012 0303       Latest Ref Rng & Units 01/28/2024  4:09 AM 01/27/2024    5:06 PM 01/27/2024    4:56 AM  CMP  Glucose 70 - 99 mg/dL 92  80  897   BUN 8 - 23 mg/dL 63  60  58   Creatinine 0.44 - 1.00 mg/dL 7.21  7.29  7.40   Sodium 135 - 145 mmol/L 136  137  138   Potassium 3.5 - 5.1 mmol/L 4.9  5.4  5.6   Chloride 98 - 111 mmol/L 99  99  100   CO2 22 - 32 mmol/L 27  26  27    Calcium  8.9 - 10.3 mg/dL 8.9  9.3  9.2       Microbiology: Recent Results (from the past 240 hours)  Resp panel by RT-PCR (RSV, Flu A&B, Covid) Anterior Nasal Swab     Status: None   Collection Time: 01/26/24 12:56  PM   Specimen: Anterior Nasal Swab  Result Value Ref Range Status   SARS Coronavirus 2 by RT PCR NEGATIVE NEGATIVE Final    Comment: (NOTE) SARS-CoV-2 target nucleic acids are NOT DETECTED.  The SARS-CoV-2 RNA is generally detectable in upper respiratory specimens during the acute phase of infection. The lowest concentration of SARS-CoV-2 viral copies this assay can detect is 138 copies/mL. A negative result does not preclude SARS-Cov-2 infection and should not be used as the sole basis for treatment or other patient management decisions. A negative result may occur with  improper specimen collection/handling, submission of specimen other than nasopharyngeal swab, presence of viral mutation(s) within the areas targeted by this assay, and inadequate number of viral copies(<138 copies/mL). A negative result must be combined with clinical observations, patient history, and epidemiological information. The expected result is Negative.  Fact Sheet for Patients:  bloggercourse.com  Fact Sheet for Healthcare Providers:  seriousbroker.it  This test is no t yet approved or cleared by the [place]  FDA and  has been authorized for detection and/or diagnosis of SARS-CoV-2 by FDA under an Emergency Use Authorization (EUA). This EUA will remain  in effect (meaning this test can be used) for the duration of the COVID-19 declaration under Section 564(b)(1) of the Act, 21 U.S.C.section 360bbb-3(b)(1), unless the authorization is terminated  or revoked sooner.       Influenza A by PCR NEGATIVE NEGATIVE Final   Influenza B by PCR NEGATIVE NEGATIVE Final    Comment: (NOTE) The Xpert Xpress SARS-CoV-2/FLU/RSV plus assay is intended as an aid in the diagnosis of influenza from Nasopharyngeal swab specimens and should not be used as a sole basis for treatment. Nasal washings and aspirates are unacceptable for Xpert Xpress  SARS-CoV-2/FLU/RSV testing.  Fact Sheet for Patients: bloggercourse.com  Fact Sheet for Healthcare Providers: seriousbroker.it  This test is not yet approved or cleared by the [place]  FDA and has been authorized for detection and/or diagnosis of SARS-CoV-2 by FDA under an Emergency Use Authorization (EUA). This EUA will remain in effect (meaning this test can be used) for the duration of the COVID-19 declaration under Section 564(b)(1) of the Act, 21 U.S.C. section 360bbb-3(b)(1), unless the authorization is terminated or revoked.     Resp Syncytial Virus by PCR NEGATIVE NEGATIVE Final    Comment: (NOTE) Fact Sheet for Patients: bloggercourse.com  Fact Sheet for Healthcare Providers: seriousbroker.it  This test is not yet approved or cleared by the [place]  FDA and has been authorized for detection and/or diagnosis of SARS-CoV-2 by FDA under an Emergency Use Authorization (EUA). This EUA will remain in effect (  meaning this test can be used) for the duration of the COVID-19 declaration under Section 564(b)(1) of the Act, 21 U.S.C. section 360bbb-3(b)(1), unless the authorization is terminated or revoked.  Performed at [facility], [address] [place], [place] [address]   Blood Culture (routine x 2)     Status: Abnormal (Preliminary result)   Collection Time: 01/26/24 12:56 PM   Specimen: BLOOD  Result Value Ref Range Status   Specimen Description   Final    BLOOD BLOOD RIGHT FOREARM Performed at [facility], [address] [place], [place] [address]    Special Requests   Final    BOTTLES DRAWN AEROBIC AND ANAEROBIC Blood Culture results may not be optimal due to an inadequate volume of blood received in culture bottles Performed at [facility], [address], [place], [place] [address]    Culture  Setup Time   Final     GRAM NEGATIVE RODS AEROBIC BOTTLE ONLY CRITICAL VALUE NOTED.  VALUE IS CONSISTENT WITH PREVIOUSLY REPORTED AND CALLED VALUE. Performed at [facility], [address] [place], [place] [address]    Culture PSEUDOMONAS AERUGINOSA (A)  Final   Report Status PENDING  Incomplete  Blood Culture (routine x 2)     Status: Abnormal (Preliminary result)   Collection Time: 01/26/24  1:01 PM   Specimen: BLOOD  Result Value Ref Range Status   Specimen Description   Final    BLOOD RIGHT ARM Performed at [facility], [address], [place], [place] [address]    Special Requests   Final    BOTTLES DRAWN AEROBIC AND ANAEROBIC Blood Culture adequate volume Performed at [facility], [address] [place], [place] [address]    Culture  Setup Time   Final    GRAM NEGATIVE RODS AEROBIC BOTTLE ONLY CRITICAL RESULT CALLED TO, READ BACK BY AND VERIFIED WITH:  NATHAN BELUE AT 9458 01/27/24 JG    Culture (A)  Final    PSEUDOMONAS AERUGINOSA SUSCEPTIBILITIES TO FOLLOW Performed at [facility], [address], [place], [place] [address]    Report Status PENDING  Incomplete  Blood Culture ID Panel (Reflexed)     Status: Abnormal   Collection Time: 01/26/24  1:01 PM  Result Value Ref Range Status   Enterococcus faecalis NOT DETECTED NOT DETECTED Final   Enterococcus Faecium NOT DETECTED NOT DETECTED Final   Listeria monocytogenes NOT DETECTED NOT DETECTED Final   Staphylococcus species NOT DETECTED NOT DETECTED Final   Staphylococcus aureus (BCID) NOT DETECTED NOT DETECTED Final   Staphylococcus epidermidis NOT DETECTED NOT DETECTED Final   Staphylococcus lugdunensis NOT DETECTED NOT DETECTED Final   Streptococcus species NOT DETECTED NOT DETECTED Final   Streptococcus agalactiae NOT DETECTED NOT DETECTED Final   Streptococcus pneumoniae NOT DETECTED NOT DETECTED Final   Streptococcus pyogenes NOT DETECTED NOT DETECTED Final   A.calcoaceticus-baumannii NOT  DETECTED NOT DETECTED Final   Bacteroides fragilis NOT DETECTED NOT DETECTED Final   Enterobacterales NOT DETECTED NOT DETECTED Final   Enterobacter cloacae complex NOT DETECTED NOT DETECTED Final   Escherichia coli NOT DETECTED NOT DETECTED Final   Klebsiella aerogenes NOT DETECTED NOT DETECTED Final   Klebsiella oxytoca NOT DETECTED NOT DETECTED Final   Klebsiella pneumoniae NOT DETECTED NOT DETECTED Final   Proteus species NOT DETECTED NOT DETECTED Final   Salmonella species NOT DETECTED NOT DETECTED Final   Serratia marcescens NOT DETECTED NOT DETECTED Final   Haemophilus influenzae NOT DETECTED  NOT DETECTED Final   Neisseria meningitidis NOT DETECTED NOT DETECTED Final   Pseudomonas aeruginosa DETECTED (A) NOT DETECTED Final    Comment: CRITICAL RESULT CALLED TO, READ BACK BY AND VERIFIED WITH:  NATHAN BELUE AT 0541 01/27/24 JG    Stenotrophomonas maltophilia NOT DETECTED NOT DETECTED Final   Candida albicans NOT DETECTED NOT DETECTED Final   Candida auris NOT DETECTED NOT DETECTED Final   Candida glabrata NOT DETECTED NOT DETECTED Final   Candida krusei NOT DETECTED NOT DETECTED Final   Candida parapsilosis NOT DETECTED NOT DETECTED Final   Candida tropicalis NOT DETECTED NOT DETECTED Final   Cryptococcus neoformans/gattii NOT DETECTED NOT DETECTED Final   CTX-M ESBL NOT DETECTED NOT DETECTED Final   Carbapenem resistance IMP NOT DETECTED NOT DETECTED Final   Carbapenem resistance KPC NOT DETECTED NOT DETECTED Final   Carbapenem resistance NDM NOT DETECTED NOT DETECTED Final   Carbapenem resistance VIM NOT DETECTED NOT DETECTED Final    Comment: Performed at [facility], [address] [place], [place] [address]    IMAGING RESULTS: CXR Cardiomegaly with pulmonary vascular congestion  I have personally reviewed the films ? Impression/Recommendation 83 year old female with history of bilateral lymphedema but chronic verrucous changes presenting with fever and  chills  Pseudomonas bacteremia in both sets of cultures The source could be the legs but may also need to rule out other causes including lungs Given that the chest x-ray just showed cardiomegaly with pulmonary congestion we may need to do CT scan if the leukocytosis persist Patient is currently on cefepime Changed to ceftazidime because of myoclonus Can discontinue linezolid Ordered repeat blood cultures Will get a 2D echo  Chronic lymphedema with verrucous changes 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  Acute on chronic hypoxic respiratory failure on BiPAP  AKI on CKD  Myoclonic jerks  rule out CO2 narcosis versus cefepime induced changes Will change the cefepime to ceftazidime  CHF  SVT she is on amiodarone  Anemia  COPD    _I have personally spent  -75--minutes involved in face-to-face and non-face-to-face activities for this patient on the day of the visit. Professional time spent includes the following activities: Preparing to see the patient (review of tests), Obtaining and/or reviewing separately obtained history (admission/discharge record), Performing a medically appropriate examination and/or evaluation , Ordering medications/tests/procedures, referring and communicating with other health care professionals, Documenting clinical information in the EMR, Independently interpreting results (not separately reported), Communicating results to the son, Counseling and educating the son and Care coordination (not separately reported).   Care for a confirmed infection was provided by an ID specialist for this consult  ________________________________________________  Note:  This document was prepared using Dragon voice recognition software and may include unintentional dictation errors.

## 2024-01-29 ENCOUNTER — Inpatient Hospital Stay

## 2024-01-29 DIAGNOSIS — D72829 Elevated white blood cell count, unspecified: Secondary | ICD-10-CM | POA: Diagnosis not present

## 2024-01-29 DIAGNOSIS — I890 Lymphedema, not elsewhere classified: Secondary | ICD-10-CM

## 2024-01-29 DIAGNOSIS — B965 Pseudomonas (aeruginosa) (mallei) (pseudomallei) as the cause of diseases classified elsewhere: Secondary | ICD-10-CM

## 2024-01-29 DIAGNOSIS — G9341 Metabolic encephalopathy: Secondary | ICD-10-CM

## 2024-01-29 DIAGNOSIS — A419 Sepsis, unspecified organism: Secondary | ICD-10-CM | POA: Diagnosis not present

## 2024-01-29 DIAGNOSIS — R7881 Bacteremia: Secondary | ICD-10-CM | POA: Diagnosis not present

## 2024-01-29 DIAGNOSIS — L0390 Cellulitis, unspecified: Secondary | ICD-10-CM | POA: Diagnosis not present

## 2024-01-29 DIAGNOSIS — R0689 Other abnormalities of breathing: Secondary | ICD-10-CM

## 2024-01-29 LAB — CBC
HCT: 28 % — ABNORMAL LOW (ref 36.0–46.0)
Hemoglobin: 8.2 g/dL — ABNORMAL LOW (ref 12.0–15.0)
MCH: 27.9 pg (ref 26.0–34.0)
MCHC: 29.3 g/dL — ABNORMAL LOW (ref 30.0–36.0)
MCV: 95.2 fL (ref 80.0–100.0)
Platelets: 248 K/uL (ref 150–400)
RBC: 2.94 MIL/uL — ABNORMAL LOW (ref 3.87–5.11)
RDW: 13.8 % (ref 11.5–15.5)
WBC: 16.8 K/uL — ABNORMAL HIGH (ref 4.0–10.5)
nRBC: 0 % (ref 0.0–0.2)

## 2024-01-29 LAB — GLUCOSE, CAPILLARY
Glucose-Capillary: 89 mg/dL (ref 70–99)
Glucose-Capillary: 84 mg/dL (ref 70–99)
Glucose-Capillary: 78 mg/dL (ref 70–99)
Glucose-Capillary: 82 mg/dL (ref 70–99)

## 2024-01-29 LAB — BASIC METABOLIC PANEL WITH GFR
Anion gap: 12 (ref 5–15)
BUN: 65 mg/dL — ABNORMAL HIGH (ref 8–23)
CO2: 27 mmol/L (ref 22–32)
Calcium: 9.7 mg/dL (ref 8.9–10.3)
Chloride: 99 mmol/L (ref 98–111)
Creatinine, Ser: 2.86 mg/dL — ABNORMAL HIGH (ref 0.44–1.00)
GFR, Estimated: 16 mL/min — ABNORMAL LOW (ref >60)
Glucose, Bld: 85 mg/dL (ref 70–99)
Potassium: 4.7 mmol/L (ref 3.5–5.1)
Sodium: 139 mmol/L (ref 135–145)

## 2024-01-29 LAB — CULTURE, BLOOD (ROUTINE X 2)

## 2024-01-29 LAB — UREA NITROGEN, URINE: Urea Nitrogen, Ur: 487 mg/dL

## 2024-01-29 MED ORDER — AMIODARONE HCL 200 MG PO TABS
200.0000 mg | ORAL_TABLET | Freq: Every day | ORAL | Status: DC
  Administered 2024-02-05 – 2024-02-08 (×4): 200 mg via ORAL
  Filled 2024-01-29 (×4): qty 1

## 2024-01-29 MED ORDER — AMIODARONE HCL 200 MG PO TABS
400.0000 mg | ORAL_TABLET | Freq: Two times a day (BID) | ORAL | Status: AC
Start: 2024-01-29 — End: 2024-02-04
  Administered 2024-01-29 – 2024-02-04 (×14): 400 mg via ORAL
  Filled 2024-01-29 (×14): qty 2

## 2024-01-29 MED ORDER — MORPHINE SULFATE (PF) 2 MG/ML IV SOLN
2.0000 mg | INTRAVENOUS | Status: DC | PRN
  Administered 2024-01-29 (×2): 2 mg via INTRAVENOUS
  Filled 2024-01-29 (×2): qty 1

## 2024-01-29 MED ORDER — HYDROMORPHONE HCL 1 MG/ML IJ SOLN
0.5000 mg | INTRAMUSCULAR | Status: DC | PRN
Start: 2024-01-29 — End: 2024-02-08
  Administered 2024-01-30 – 2024-02-01 (×5): 0.5 mg via INTRAVENOUS
  Filled 2024-01-29 (×5): qty 0.5

## 2024-01-29 MED ORDER — FUROSEMIDE 10 MG/ML IJ SOLN
60.0000 mg | Freq: Two times a day (BID) | INTRAMUSCULAR | Status: DC
  Administered 2024-01-29 – 2024-02-01 (×8): 60 mg via INTRAVENOUS
  Filled 2024-01-29 (×8): qty 6

## 2024-01-29 NOTE — Plan of Care (Signed)
  Problem: Fluid Volume: Goal: Hemodynamic stability will improve Outcome: Progressing   Problem: Clinical Measurements: Goal: Diagnostic test results will improve Outcome: Progressing Goal: Signs and symptoms of infection will decrease Outcome: Progressing   Problem: Respiratory: Goal: Ability to maintain adequate ventilation will improve Outcome: Progressing   Problem: Education: Goal: Knowledge of General Education information will improve Description: Including pain rating scale, medication(s)/side effects and non-pharmacologic comfort measures Outcome: Progressing   Problem: Health Behavior/Discharge Planning: Goal: Ability to manage health-related needs will improve Outcome: Progressing   Problem: Clinical Measurements: Goal: Ability to maintain clinical measurements within normal limits will improve Outcome: Progressing Goal: Will remain free from infection Outcome: Progressing Goal: Diagnostic test results will improve Outcome: Progressing Goal: Respiratory complications will improve Outcome: Progressing Goal: Cardiovascular complication will be avoided Outcome: Progressing   Problem: Activity: Goal: Risk for activity intolerance will decrease Outcome: Progressing   Problem: Nutrition: Goal: Adequate nutrition will be maintained Outcome: Progressing   Problem: Coping: Goal: Level of anxiety will decrease Outcome: Progressing   Problem: Elimination: Goal: Will not experience complications related to bowel motility Outcome: Progressing Goal: Will not experience complications related to urinary retention Outcome: Progressing   Problem: Pain Managment: Goal: General experience of comfort will improve and/or be controlled Outcome: Progressing   Problem: Safety: Goal: Ability to remain free from injury will improve Outcome: Progressing   Problem: Skin Integrity: Goal: Risk for impaired skin integrity will decrease Outcome: Progressing   Problem:  Clinical Measurements: Goal: Ability to avoid or minimize complications of infection will improve Outcome: Progressing   Problem: Skin Integrity: Goal: Skin integrity will improve Outcome: Progressing

## 2024-01-29 NOTE — Evaluation (Signed)
 Physical Therapy Evaluation Patient Details Name: MARIXA MELLOTT MRN: [id number] DOB: 09-05-1940 Today's Date: 01/29/2024  History of Present Illness  KANDI BRUSSEAU is a 83 y.o. 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 She was placed on BiPAP and uses 4L at baseline.  Clinical Impression  PT/OT cotx on this date. Pt is an 83 y.o female admitted for sepsis d/t cellulitis. Upon PT/OT arrival pt was asleep in bed with lights off. Upon waking pt remained lethargic throughout session which decreased slightly with mobilization. Pt requires frequent cuing to remain awake. Pt requires 3+ total assist to transition from supine to EOB, with 1 assist at each LE to guide on/off bed and 1 assist behind. Pt requires BUE support to maintain balance at EOB but was able to remain EOB for 2-3 min. Pt requires 3+ total assist to transition from EOB. Pt left in bed in recliner position with all needs met with lights on and window shades open to assist with arousal. Pt will benefit from continued skilled therapy services to address her recent decline in functional mobility and activity tolerance.      If plan is discharge home, recommend the following: A lot of help with walking and/or transfers   Can travel by private vehicle   No    Equipment Recommendations Rolling walker (2 wheels)  Recommendations for Other Services       Functional Status Assessment Patient has had a recent decline in their functional status and demonstrates the ability to make significant improvements in function in a reasonable and predictable amount of time.     Precautions / Restrictions Precautions Precautions: Fall Recall of Precautions/Restrictions:  Intact Restrictions Weight Bearing Restrictions Per Provider Order: No      Mobility  Bed Mobility Overal bed mobility: Needs Assistance Bed Mobility: Supine to Sit, Sit to Supine     Supine to sit: Total assist Sit to supine: Total assist   General bed mobility comments: total 3+ assist to mobilize to EOB from supine; 1 assist at each leg to guide on/off bed; total 2+ to scoot to HOB in supine    Transfers                   General transfer comment: NT - EOB highest level performed    Ambulation/Gait               General Gait Details: NT - EOB highest level performed  Stairs            Wheelchair Mobility     Tilt Bed    Modified Rankin (Stroke Patients Only)       Balance Overall balance assessment: Needs assistance Sitting-balance support: Bilateral upper extremity supported, Feet supported Sitting balance-Leahy Scale: Poor Sitting balance - Comments: need for BUE to support in sitting       Standing balance comment: NT - EOB highest level performed                             Pertinent Vitals/Pain Pain Assessment Pain Assessment: Faces Faces Pain Scale: Hurts even more Pain Location: BLE swelling Pain Descriptors / Indicators: Tender Pain Intervention(s): Monitored during session, Limited activity within patient's  tolerance    Home Living Family/patient expects to be discharged to:: Private residence Living Arrangements: Children Available Help at Discharge: Family Type of Home: House Home Access: Ramped entrance       Home Layout: One level Home Equipment: Wheelchair - manual;Cane - single point;Rollator (4 wheels) Additional Comments: pt used w/c for community mobiltiy    Prior Function Prior Level of Function : Needs assist       Physical Assist : Mobility (physical);ADLs (physical) Mobility (physical): Gait ADLs (physical): Toileting;Dressing Mobility Comments: household ambulator with rollator        Extremity/Trunk Assessment   Upper Extremity Assessment Upper Extremity Assessment: LUE deficits/detail LUE Deficits / Details: hx of LUE pain limiting WB and mobility    Lower Extremity Assessment Lower Extremity Assessment: Generalized weakness    Cervical / Trunk Assessment Cervical / Trunk Assessment: Kyphotic  Communication   Communication Communication: No apparent difficulties Factors Affecting Communication: Reduced clarity of speech    Cognition Arousal: Lethargic Behavior During Therapy: WFL for tasks assessed/performed   PT - Cognitive impairments: No apparent impairments                       PT - Cognition Comments: difficult to keep alert Following commands: Intact       Cueing Cueing Techniques: Verbal cues, Tactile cues     General Comments General comments (skin integrity, edema, etc.): BLE swelling and pain    Exercises     Assessment/Plan    PT Assessment Patient needs continued PT services  PT Problem List Decreased strength;Decreased activity tolerance;Decreased mobility;Decreased balance       PT Treatment Interventions Functional mobility training;Therapeutic activities;Balance training    PT Goals (Current goals can be found in the Care Plan section)  Acute Rehab PT Goals Patient Stated Goal: none stated PT Goal Formulation: With patient Time For Goal Achievement: 02/12/24 Potential to Achieve Goals: Fair    Frequency Min 2X/week     Co-evaluation PT/OT/SLP Co-Evaluation/Treatment: Yes Reason for Co-Treatment: Complexity of the patient's impairments (multi-system involvement) PT goals addressed during session: Mobility/safety with mobility OT goals addressed during session: ADL's and self-care       AM-PAC PT 6 Clicks Mobility  Outcome Measure Help needed turning from your back to your side while in a flat bed without using bedrails?: Total Help needed moving from lying on your back to sitting on the side of  a flat bed without using bedrails?: Total Help needed moving to and from a bed to a chair (including a wheelchair)?: Total Help needed standing up from a chair using your arms (e.g., wheelchair or bedside chair)?: Total Help needed to walk in hospital room?: Total Help needed climbing 3-5 steps with a railing? : Total 6 Click Score: 6    End of Session   Activity Tolerance: Patient limited by lethargy Patient left: in bed;with call bell/phone within reach;with bed alarm set Nurse Communication: Mobility status PT Visit Diagnosis: Muscle weakness (generalized) (M62.81);Other abnormalities of gait and mobility (R26.89)    Time: 9051-8984 PT Time Calculation (min) (ACUTE ONLY): 27 min   Charges:   PT Evaluation $PT Eval Moderate Complexity: 1 Mod   PT General Charges $$ ACUTE PT VISIT: 1 Visit         Allena Bulls, SPT   Allena Bulls 01/29/2024, 12:29 PM

## 2024-01-29 NOTE — Progress Notes (Signed)
 Date of Admission:  01/26/2024      ID: Kelly Hogan is a 83 y.o. female Principal Problem:   Sepsis due to cellulitis [facility]) Active Problems:   Essential (primary) hypertension   Type 2 diabetes mellitus (HCC)   Chronic venous insufficiency   HLD (hyperlipidemia)   Morbid obesity with BMI of 50.0-59.9, adult (HCC)   OSA (obstructive sleep apnea)   Obesity hypoventilation syndrome (HCC)   (HFpEF) heart failure with preserved ejection fraction (HCC)  Patient's son and brother at bedside  Kelly Hogan is a 32 y.o. with a history of HFpEF, OSA, morbid obesity, HTN, DM, COPD on oxygen at home, chronic lymphedema legs presented to the ED brought in by EMS from home with fever and chills of 2 days  Subjective: Patient is obtunded On BiPAP  Medications:   amiodarone  400 mg Oral BID   Followed by   Kelly Hogan ON 02/05/2024] amiodarone  200 mg Oral Daily   apixaban  2.5 mg Oral BID   budesonide-glycopyrrolate-formoterol  2 puff Inhalation BID   furosemide   60 mg Intravenous BID   midodrine  5 mg Oral TID WC   pantoprazole   40 mg Oral Daily   pravastatin  20 mg Oral QPM   sodium chloride  flush  3 mL Intravenous Q12H    Objective: Vital signs in last 24 hours: Patient Vitals for the past 24 hrs:  BP Temp Temp src Pulse Resp SpO2 Weight  01/29/24 1900 (!) 125/46 97.6 F (36.4 C) Axillary -- -- -- --  01/29/24 1800 (!) 140/52 -- -- 77 19 98 % --  01/29/24 1700 (!) 142/59 -- -- 78 16 98 % --  01/29/24 1602 -- -- -- 76 20 97 % --  01/29/24 1600 (!) 128/55 -- -- 70 17 (!) 89 % --  01/29/24 1500 (!) 107/44 98.5 F (36.9 C) Axillary 70 13 98 % --  01/29/24 1430 -- -- -- 72 13 95 % --  01/29/24 1400 136/61 -- -- -- -- -- --  01/29/24 1300 (!) 121/50 -- -- 72 12 99 % --  01/29/24 1200 (!) 106/43 -- -- 72 13 98 % --  01/29/24 1154 (!) 104/44 98.1 F (36.7 C) Axillary -- 15 98 % --  01/29/24 1130 -- -- -- 73 13 98 % --  01/29/24 1124 -- -- -- 74 13 98 % --  01/29/24 1100 --  -- -- 77 13 98 % --  01/29/24 1000 -- -- -- 77 15 98 % --  01/29/24 0936 -- -- -- 72 13 97 % --  01/29/24 0900 135/62 -- -- -- 20 -- --  01/29/24 0832 -- -- -- 80 16 99 % --  01/29/24 0800 -- -- -- 86 (!) 21 99 % --  01/29/24 0728 -- -- -- -- -- 99 % --  01/29/24 0720 -- -- -- -- -- 98 % --  01/29/24 0710 (!) 130/58 98.3 F (36.8 C) Oral 84 15 -- --  01/29/24 0357 -- -- -- -- -- -- (!) 140 kg  01/29/24 0354 -- -- -- -- 16 -- --  01/29/24 0300 (!) 119/53 98.8 F (37.1 C) Axillary 82 13 100 % --  01/28/24 2300 (!) 135/55 98.7 F (37.1 C) Axillary 80 -- -- --       PHYSICAL EXAM:  General: Obtunded BiPAP Lungs: Bilateral air entry Heart: S1-S2 Abdomen: Soft, non-tender,not distended. Bowel sounds normal. No masses Extremities:     Skin: No rashes or  lesions. Or bruising Lymph: Cervical, supraclavicular normal. Neurologic: Grossly non-focal  Lab Results    Latest Ref Rng & Units 01/29/2024    4:38 AM 01/28/2024    4:09 AM 01/27/2024    4:56 AM  CBC  WBC 4.0 - 10.5 K/uL 16.8  21.6  24.0   Hemoglobin 12.0 - 15.0 g/dL 8.2  8.1  8.7   Hematocrit 36.0 - 46.0 % 28.0  27.3  29.4   Platelets 150 - 400 K/uL 248  232  249        Latest Ref Rng & Units 01/29/2024    4:38 AM 01/28/2024    4:09 AM 01/27/2024    5:06 PM  CMP  Glucose 70 - 99 mg/dL 85  92  80   BUN 8 - 23 mg/dL 65  63  60   Creatinine 0.44 - 1.00 mg/dL 7.13  7.21  7.29   Sodium 135 - 145 mmol/L 139  136  137   Potassium 3.5 - 5.1 mmol/L 4.7  4.9  5.4   Chloride 98 - 111 mmol/L 99  99  99   CO2 22 - 32 mmol/L 27  27  26    Calcium  8.9 - 10.3 mg/dL 9.7  8.9  9.3       Microbiology: 01/26/2024 blood cultures Pseudomonas into 2 sets 01/29/2024 blood culture sent Studies/Results: US  RENAL Result Date: 01/29/2024 CLINICAL DATA:  Acute renal failure. EXAM: RENAL / URINARY TRACT ULTRASOUND COMPLETE COMPARISON:  None Available. FINDINGS: Right Kidney: Renal measurements: 10.8 x 4.5 x 5.2 cm = volume: 131 mL.  Echogenicity within normal limits. No mass or hydronephrosis visualized. Left Kidney: Renal measurements: 11.2 x 5.2 x 5.0 cm = volume: 150 mL. Echogenicity within normal limits. No mass or hydronephrosis visualized. Limited definition of both kidneys by ultrasound due to body habitus. Bladder: Appears normal for degree of bladder distention. Other: None. IMPRESSION: Unremarkable renal ultrasound. Electronically Signed   By: Marcey Moan M.D.   On: 01/29/2024 16:06   DG Chest Port 1 View Result Date: 01/28/2024 CLINICAL DATA:  Shortness of breath EXAM: PORTABLE CHEST 1 VIEW COMPARISON:  Chest x-ray 01/26/2024 FINDINGS: Heart is enlarged. There central pulmonary vascular congestion. There some minimal patchy opacities in the right mid lung. The costophrenic angles are clear. No pneumothorax or acute fracture. There are degenerative changes of the left shoulder. IMPRESSION: 1. Cardiomegaly with central pulmonary vascular congestion. 2. Minimal patchy opacities in the right mid lung may represent atelectasis or infection. Electronically Signed   By: Greig Pique M.D.   On: 01/28/2024 17:49   ECHOCARDIOGRAM COMPLETE Result Date: 01/28/2024    ECHOCARDIOGRAM REPORT   Patient Name:   Kelly Hogan Date of Exam: 01/28/2024 Medical Rec #:  [id number]         Height:       64.0 in Accession #:    [id number]        Weight:       308.6 lb Date of Birth:  1941/01/20         BSA:          2.353 m Patient Age:    83 years          BP:           117/59 mmHg Patient Gender: F                 HR:           89 bpm. Exam Location:  [facility] Procedure: 2D Echo, Cardiac Doppler and Color Doppler (Both Spectral and Color            Flow Doppler were utilized during procedure). Indications:     Abnormal ECG R94.31  History:         Patient has no prior history of Echocardiogram examinations.                  COPD; Risk Factors:Hypertension and Diabetes.  Sonographer:     Christopher Furnace Referring Phys:  [id number] [facility]  Diagnosing Phys: Marsa Dooms MD  Sonographer Comments: Technically challenging study due to limited acoustic windows, patient is obese and no apical window. IMPRESSIONS  1. Left ventricular ejection fraction, by estimation, is 60 to 65%. The left ventricle has normal function. The left ventricle has no regional wall motion abnormalities. Left ventricular diastolic parameters are indeterminate.  2. Right ventricular systolic function is normal. The right ventricular size is normal.  3. The mitral valve is normal in structure. Mild mitral valve regurgitation. No evidence of mitral stenosis.  4. The aortic valve is normal in structure. Aortic valve regurgitation is not visualized. No aortic stenosis is present.  5. The inferior vena cava is normal in size with greater than 50% respiratory variability, suggesting right atrial pressure of 3 mmHg. FINDINGS  Left Ventricle: Left ventricular ejection fraction, by estimation, is 60 to 65%. The left ventricle has normal function. The left ventricle has no regional wall motion abnormalities. Strain was performed and the global longitudinal strain is indeterminate. The left ventricular internal cavity size was normal in size. There is no left ventricular hypertrophy. Left ventricular diastolic parameters are indeterminate. Right Ventricle: The right ventricular size is normal. No increase in right ventricular wall thickness. Right ventricular systolic function is normal. Left Atrium: Left atrial size was normal in size. Right Atrium: Right atrial size was normal in size. Pericardium: There is no evidence of pericardial effusion. Mitral Valve: The mitral valve is normal in structure. Mild mitral valve regurgitation. No evidence of mitral valve stenosis. Tricuspid Valve: The tricuspid valve is normal in structure. Tricuspid valve regurgitation is mild . No evidence of tricuspid stenosis. Aortic Valve: The aortic valve is normal in structure. Aortic valve regurgitation is  not visualized. No aortic stenosis is present. Pulmonic Valve: The pulmonic valve was normal in structure. Pulmonic valve regurgitation is not visualized. No evidence of pulmonic stenosis. Aorta: The aortic root is normal in size and structure. Venous: The inferior vena cava is normal in size with greater than 50% respiratory variability, suggesting right atrial pressure of 3 mmHg. IAS/Shunts: No atrial level shunt detected by color flow Doppler. Additional Comments: 3D was performed not requiring image post processing on an independent workstation and was indeterminate.  LEFT VENTRICLE PLAX 2D LVIDd:         4.80 cm LVIDs:         2.80 cm LV PW:         1.00 cm LV IVS:        1.10 cm LVOT diam:     2.00 cm LVOT Area:     3.14 cm  LEFT ATRIUM         Index LA diam:    2.90 cm 1.23 cm/m   AORTA Ao Root diam: 2.70 cm  SHUNTS Systemic Diam: 2.00 cm Marsa Dooms MD Electronically signed by Marsa Dooms MD Signature Date/Time: 01/28/2024/1:33:28 PM    Final      Assessment/Plan: 83 year old female with history  of bilateral lymphedema with chronic surgical changes presenting with fever and chills  Pseudomonas bacteremia: Susceptible organism Source of the infection likely the legs Patient is currently on ceftazidime Initially she was on cefepime but was changed to ceftazidime due to myoclonus Repeat blood culture has been sent 2D echo does not show any valvular vegetation though a limited study She will need at least a minimum of 7 days up to 10 days of antibiotic Will assess how she progresses and then decide on the duration  Metabolic encephalopathy secondary to likely CO2 narcosis  Acute on chronic hypoxic and hypercapnic respiratory failure on BiPAP  COPD  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

## 2024-01-29 NOTE — Evaluation (Signed)
 Occupational Therapy Evaluation Patient Details Name: Kelly Hogan MRN: [id number] DOB: [birth year] Today's Date: 01/29/2024   History of Present Illness   Kelly Hogan is a 83 y.o. 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 She was placed on BiPAP and uses 4L at baseline.     Clinical Impressions Pt was seen for OT evaluation and co-tx with PT this date. Pt presents with deficits in strength, balance, activity tolerance, alertness, BLE pain, and lethargy, affecting safe and optimal ADL completion. Pt currently requires TOTAL A +3 for all aspects of bed mobility, MAX A for grooming while seated EOB, and anticipate MAX-TOTAL A for all aspects of bed level bathing, dressing, and toileting. Pt would benefit from skilled OT services to address noted impairments and functional limitations (see below for any additional details) in order to maximize safety and independence while minimizing future risk of falls, injury, and readmission. Anticipate the need for follow up OT services upon acute hospital DC.    If plan is discharge home, recommend the following:   Two people to help with walking and/or transfers;Two people to help with bathing/dressing/bathroom;Direct supervision/assist for medications management;Supervision due to cognitive status;Assist for transportation;Assistance with cooking/housework;Help with stairs or ramp for entrance;Assistance with feeding;Direct supervision/assist for financial management     Functional Status Assessment   Patient has had a recent decline in their functional status and demonstrates the ability to make significant improvements in function in a reasonable and predictable amount of time.     Equipment  Recommendations   Other (comment) (defer)     Recommendations for Other Services         Precautions/Restrictions   Precautions Precautions: Fall Recall of Precautions/Restrictions: Impaired Restrictions Weight Bearing Restrictions Per Provider Order: No     Mobility Bed Mobility Overal bed mobility: Needs Assistance Bed Mobility: Supine to Sit, Sit to Supine     Supine to sit: Total assist, +2 for physical assistance Sit to supine: Total assist, +2 for physical assistance   General bed mobility comments: total 3+ assist to mobilize to EOB from supine; 1 assist at each leg to guide on/off bed; total 2+ to scoot to HOB in supine    Transfers                   General transfer comment: NT - EOB highest level performed      Balance Overall balance assessment: Needs assistance Sitting-balance support: Bilateral upper extremity supported, Feet supported Sitting balance-Leahy Scale: Poor Sitting balance - Comments: need for BUE to support in sitting                                   ADL either performed or assessed with clinical judgement   ADL Overall ADL's : Needs assistance/impaired     Grooming: Sitting;Maximal assistance                                 General ADL Comments: grossly MAX A to TOTAL A For all bathing, dressing, and toileting at bed level.     Vision  Perception         Praxis         Pertinent Vitals/Pain Pain Assessment Pain Assessment: Faces Faces Pain Scale: Hurts even more Pain Location: BLE swelling Pain Descriptors / Indicators: Tender Pain Intervention(s): Monitored during session, Repositioned, Limited activity within patient's tolerance     Extremity/Trunk Assessment Upper Extremity Assessment Upper Extremity Assessment: LUE deficits/detail;Generalized weakness LUE Deficits / Details: hx of LUE pain limiting WB and mobility   Lower Extremity Assessment Lower Extremity  Assessment: Generalized weakness   Cervical / Trunk Assessment Cervical / Trunk Assessment: Kyphotic   Communication Communication Communication: Impaired Factors Affecting Communication: Reduced clarity of speech   Cognition Arousal: Lethargic Behavior During Therapy: WFL for tasks assessed/performed Cognition: No family/caregiver present to determine baseline                               Following commands: Intact       Cueing  General Comments   Cueing Techniques: Verbal cues;Tactile cues  BLE swelling and pain   Exercises     Shoulder Instructions      Home Living Family/patient expects to be discharged to:: Private residence Living Arrangements: Children Available Help at Discharge: Family Type of Home: House Home Access: Ramped entrance     Home Layout: One level     Bathroom Shower/Tub: Sponge bathes at baseline   Bathroom Toilet: Handicapped height Bathroom Accessibility: Yes   Home Equipment: Wheelchair - manual;Cane - single point;Rollator (4 wheels)   Additional Comments: pt used w/c for community mobiltiy      Prior Functioning/Environment Prior Level of Function : Needs assist       Physical Assist : Mobility (physical);ADLs (physical) Mobility (physical): Gait ADLs (physical): Toileting;Dressing Mobility Comments: household ambulator with rollator      OT Problem List: Decreased strength;Cardiopulmonary status limiting activity;Pain;Decreased activity tolerance;Decreased knowledge of use of DME or AE;Impaired balance (sitting and/or standing);Obesity;Impaired UE functional use   OT Treatment/Interventions: Self-care/ADL training;Therapeutic exercise;Therapeutic activities;Energy conservation;Patient/family education;DME and/or AE instruction;Balance training      OT Goals(Current goals can be found in the care plan section)   Acute Rehab OT Goals Patient Stated Goal: get better OT Goal Formulation: With patient Time  For Goal Achievement: 02/12/24 Potential to Achieve Goals: Fair ADL Goals Pt Will Perform Grooming: sitting;with set-up;with supervision Pt Will Transfer to Toilet: stand pivot transfer;bedside commode;with +2 assist;with mod assist (LRAD) Pt Will Perform Toileting - Clothing Manipulation and hygiene: sit to/from stand;with mod assist   OT Frequency:  Min 1X/week    Co-evaluation PT/OT/SLP Co-Evaluation/Treatment: Yes Reason for Co-Treatment: Complexity of the patient's impairments (multi-system involvement) PT goals addressed during session: Mobility/safety with mobility OT goals addressed during session: ADL's and self-care      AM-PAC OT 6 Clicks Daily Activity     Outcome Measure Help from another person eating meals?: Total (NPO) Help from another person taking care of personal grooming?: A Lot Help from another person toileting, which includes using toliet, bedpan, or urinal?: Total Help from another person bathing (including washing, rinsing, drying)?: Total Help from another person to put on and taking off regular upper body clothing?: Total Help from another person to put on and taking off regular lower body clothing?: Total 6 Click Score: 7   End of Session Equipment Utilized During Treatment: Oxygen Nurse Communication: Mobility status  Activity Tolerance: Patient limited by lethargy Patient left: in bed;with call bell/phone within reach;with bed  alarm set  OT Visit Diagnosis: Muscle weakness (generalized) (M62.81);Pain;Other abnormalities of gait and mobility (R26.89) Pain - Right/Left: Left (both) Pain - part of body: Leg                Time: 9051-8988 OT Time Calculation (min): 23 min Charges:  OT General Charges $OT Visit: 1 Visit OT Evaluation $OT Eval Moderate Complexity: 1 Mod  Warren SAUNDERS., MPH, MS, OTR/L ascom [phone] 01/29/24, 2:39 PM

## 2024-01-29 NOTE — Progress Notes (Signed)
 PROGRESS NOTE    Kelly Hogan   FMW:[id number] DOB: 06-02-1940  DOA: 01/26/2024 Date of Service: 01/29/24 which is hospital day 3  PCP: [name] [name] LABOR, MD    Hospital course / significant events:   HPI: Kelly Hogan is a 83 y.o. 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    11/11: admitted to hospitalist w/ hypotension, cellulitis. metronidazole and cefepime in the ED. SVT with rates of 140s, EDP discussed with cardiology Dr Ammon who recommended metoprolol and if did not respond then start amiodarone, pt requiring amio gtt.   11/12: (+)Pseudomonas bacteremia - linezolid and vancomycin. Improved rate but remains on amio gtt for now, d/w cardiology they will see in AM. Was off amio briefly this evening d/t limited IV access and needed other Rx, HR was WNL but then HR back up again and back on the drip. Renal fxn worse, resp acidosis and hypercarbia, pt placed on BiPAP. Fluids changed to bicarb infusion 1L. Hyperkalemia, mild, gave K shifting Rx w/ insulin/D50, also admin Lokelma.  11/13: renal fxn continuing to worsen - Nephrology consulted. Pt remains on BiPAP this morning. Was off briefly per her request but O2 down and SOB so placed back on the BiPAP. BP soft, gave another 1L fluids. Cardiology - transition to po amio, starting eliquis. ID recs change cefepime to ceftazidime d/t myoclonus effects and confusion, can [place] linezolid, repeat BCx. Still worsening into the afternoon, CXR and BNP indicative of HFpEF w/ pulmonary edema despite fairly clear lungs (but exam difficult). Son is ok for trial of diuretics despite renal fxn. Lasix  ordered. Monitor closely  11/14: improved onto [place] O2, renal fxn about same. Continuing on ceftazidime.  Changed morphine to dilaudid w/ renal fxn      Consultants:  Cardiology  Infectious disease   Procedures/Surgeries: none      ASSESSMENT & PLAN:   Cellulitis of the left lower extremity. Present on admission and ruled out DVT (though US  exam somewhat limited) (+)Pseudomonas bacteremia Dc cefepime d/t myoclonus / neuro effects. Continue on ceftazidime.  ID following  Monitor cultures --> pending susceptibilities  trend leukocyte count and fever curve.   ID consult for bacteremia - likely cellulitis but eval lungs as well may need CT    HFpEF Exacerbation d/t SVT + fluid tx per sepsis protocol  Echo noted preserved EF but unable to assess diastolic fxn, likely is dysfunctional. No significant valve dysfunction.  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   Responded to IV fluids and amio controlling HR Midodrine added hold antihypertensives. Cardiology following    Type 2 diabetes  Monitor glc Relatively low glc, will hold on SSI for now Restarting diet    Hyperlipidemia:  statin   Chronic venous stasis Rec follow w/ lymphedema clinic   Skin nodularity bilateral lower extremities Question hyperkeratotic lesions? Keloid type lesions?  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  Underweight - under 18  overweight - 25 to 29 obese - 30 or more Class 1 obesity: BMI of 30.0 to 34 Class 2 obesity: BMI of 35.0 to 39 Class 3 obesity: BMI of 40.0 to 49 Super Morbid Obesity: BMI 50-59 Super-super Morbid Obesity: BMI 60+ Healthy nutrition and physical activity advised as adjunct to other disease management and risk reduction treatments    DVT prophylaxis: lovenox  IV fluids:dc Nutrition: dysphagia diet for now  Unumprovident / other devices: none  Code Status: DNR per MOST form which was completed by the patient  ACP documentation reviewed: none on file in [facility]  TOC needs: TBD Medical barriers to dispo: IV abx and bacteremia. Expected medical readiness for discharge several days at least.              Subjective / Brief ROS:  Patient reports feeling improved today Denies CP   Family Communication: call to son Kelly Hogan to give update - 01/29/24 2:55 PM discussed pt's condition and current plan, all questions answered        Objective Findings:  Vitals:   01/29/24 1200 01/29/24 1300 01/29/24 1400 01/29/24 1430  BP: (!) 106/43 (!) 121/50 136/61   Pulse: 72 72  72  Resp: 13 12  13   Temp:      TempSrc:      SpO2: 98% 99%  95%  Weight:      Height:        Intake/Output Summary (Last 24 hours) at 01/29/2024 1455 Last data filed at 01/29/2024 1000 Gross per 24 hour  Intake 2252.94 ml  Output 2450 ml  Net -197.06 ml   Filed Weights   01/26/24 1256 01/28/24 0500 01/29/24 0357  Weight: (!) 136.5 kg (!) 140 kg (!) 140 kg    Examination:  Physical Exam Constitutional:      General: She is not in acute distress.    Appearance: She is ill-appearing.   Cardiovascular:     Rate and Rhythm: Normal rate and regular rhythm.  Pulmonary:     Effort: Tachypnea present. No respiratory distress.     Breath sounds: Decreased air movement present.  Musculoskeletal:     Right lower leg: Edema present.     Left lower leg: Edema present.  Skin:    Comments: See photos  Neurological:     Mental Status: She is alert and oriented to person, place, and time. Mental status is at baseline.  Psychiatric:        Mood and Affect: Mood normal.        Behavior: Behavior normal.       01/27/24 - erythema improved     01/26/24     Scheduled Medications:   amiodarone  400 mg Oral BID   Followed by   [name] ON 02/05/2024] amiodarone  200 mg Oral Daily   apixaban  2.5 mg Oral BID   budesonide-glycopyrrolate-formoterol  2 puff Inhalation BID   furosemide   60 mg Intravenous BID   midodrine  5 mg Oral TID WC   pantoprazole   40 mg Oral Daily   pravastatin  20 mg Oral QPM   sodium chloride  flush  3 mL Intravenous Q12H    Continuous Infusions:  cefTAZidime (FORTAZ)  IV Stopped (01/29/24 0957)    PRN Medications:  acetaminophen  **OR** acetaminophen , HYDROmorphone (DILAUDID) injection, senna-docusate  Antimicrobials from admission:  Anti-infectives (From admission, onward)    Start     Dose/Rate Route Frequency Ordered Stop   01/29/24 1000  cefTAZidime (FORTAZ) 2 g in sodium chloride  0.9 % 100 mL IVPB        2 g 200 mL/hr over 30 Minutes Intravenous Every 24 hours 01/28/24 1651     01/28/24 1600  vancomycin (VANCOCIN) IVPB 1000 mg/200 mL premix  Status:  Discontinued       Placed in Followed by Linked Group   1,000 mg 200 mL/hr over 60 Minutes Intravenous Every 36 hours 01/27/24 0241 01/27/24 1045   01/27/24 2200  linezolid (ZYVOX) IVPB 600 mg  Status:  Discontinued        600 mg 300 mL/hr over 60 Minutes Intravenous Every 12 hours 01/27/24 1046 01/28/24 1618   01/27/24 1200  ceFEPIme (MAXIPIME) 2 g in sodium chloride  0.9 % 100 mL IVPB   Status:  Discontinued        2 g 200 mL/hr over 30 Minutes Intravenous Every 24 hours 01/27/24 0228 01/28/24 1651   01/27/24 0400  vancomycin (VANCOREADY) IVPB 2000 mg/400 mL       Placed in Followed by Linked Group   2,000 mg 200 mL/hr over 120 Minutes Intravenous  Once 01/27/24 0241 01/27/24 0713   01/27/24 0100  ceFAZolin (ANCEF) IVPB 2g/100 mL premix  Status:  Discontinued        2 g 200 mL/hr over 30 Minutes Intravenous Every 8 hours 01/27/24 0037 01/27/24 0042   01/26/24 1300  ceFEPIme (MAXIPIME) 2 g in sodium chloride  0.9 % 100 mL IVPB        2 g 200 mL/hr over 30 Minutes Intravenous  Once 01/26/24 1256 01/26/24 1350   01/26/24 1300  metroNIDAZOLE (FLAGYL) IVPB 500 mg        500 mg 100 mL/hr over 60 Minutes Intravenous  Once 01/26/24 1256 01/26/24 1559   01/26/24 1300  vancomycin (VANCOCIN) IVPB 1000 mg/200 mL premix        1,000 mg 200 mL/hr over 60 Minutes Intravenous  Once 01/26/24 1256 01/26/24 1458           Data Reviewed:  I have personally reviewed the following...  CBC: Recent Labs  Lab 01/26/24 1256 01/27/24 0456 01/28/24 0409 01/29/24 0438  WBC 8.5 24.0* 21.6* 16.8*  NEUTROABS 7.9*  --   --   --   HGB 10.6* 8.7* 8.1* 8.2*  HCT 35.0* 29.4* 27.3* 28.0*  MCV 93.1 96.4 96.5 95.2  PLT 325 249 232 248   Basic Metabolic Panel: Recent Labs  Lab 01/26/24 1400 01/27/24 0456 01/27/24 1706 01/28/24 0409 01/29/24 0438  NA 139 138 137 136 139  K 4.7 5.6* 5.4* 4.9 4.7  CL 102 100 99 99 99  CO2 24 27 26 27 27   GLUCOSE 133* 102* 80 92 85  BUN 54* 58* 60* 63* 65*  CREATININE 2.03* 2.59* 2.70* 2.78* 2.86*  CALCIUM  9.1 9.2 9.3 8.9 9.7  MG 2.2  --   --   --   --  GFR: Estimated Creatinine Clearance: 21.3 mL/min (A) (by C-G formula based on SCr of 2.86 mg/dL (H)). Liver Function Tests: Recent Labs  Lab 01/26/24 1400  AST 16  ALT 8  ALKPHOS 45  BILITOT 0.4  PROT 6.8  ALBUMIN 3.8   No results for input(s): LIPASE, AMYLASE in the last 168  hours. No results for input(s): AMMONIA in the last 168 hours. Coagulation Profile: Recent Labs  Lab 01/26/24 1256  INR 1.0   Cardiac Enzymes: No results for input(s): CKTOTAL, CKMB, CKMBINDEX, TROPONINI in the last 168 hours. BNP (last 3 results) Recent Labs    01/26/24 1256 01/28/24 0409  PROBNP 121.0 9,777.0*   HbA1C: No results for input(s): HGBA1C in the last 72 hours. CBG: Recent Labs  Lab 01/28/24 1151 01/28/24 1655 01/28/24 2052 01/29/24 0801 01/29/24 1147  GLUCAP 101* 80 85 89 84   Lipid Profile: No results for input(s): CHOL, HDL, LDLCALC, TRIG, CHOLHDL, LDLDIRECT in the last 72 hours. Thyroid Function Tests: No results for input(s): TSH, T4TOTAL, FREET4, T3FREE, THYROIDAB in the last 72 hours.  Anemia Panel: No results for input(s): VITAMINB12, FOLATE, FERRITIN, TIBC, IRON, RETICCTPCT in the last 72 hours. Most Recent Urinalysis On File:     Component Value Date/Time   COLORURINE STRAW (A) 01/26/2024 1430   APPEARANCEUR CLEAR (A) 01/26/2024 1430   APPEARANCEUR Cloudy 10/14/2012 2100   LABSPEC 1.009 01/26/2024 1430   LABSPEC 1.016 10/14/2012 2100   PHURINE 5.0 01/26/2024 1430   GLUCOSEU 150 (A) 01/26/2024 1430   GLUCOSEU Negative 10/14/2012 2100   HGBUR NEGATIVE 01/26/2024 1430   BILIRUBINUR NEGATIVE 01/26/2024 1430   BILIRUBINUR Negative 10/14/2012 2100   KETONESUR NEGATIVE 01/26/2024 1430   PROTEINUR 100 (A) 01/26/2024 1430   NITRITE NEGATIVE 01/26/2024 1430   LEUKOCYTESUR NEGATIVE 01/26/2024 1430   LEUKOCYTESUR Negative 10/14/2012 2100   Sepsis Labs: @LABRCNTIP (procalcitonin:4,lacticidven:4) Microbiology: Recent Results (from the past 240 hours)  Resp panel by RT-PCR (RSV, Flu A&B, Covid) Anterior Nasal Swab     Status: None   Collection Time: 01/26/24 12:56 PM   Specimen: Anterior Nasal Swab  Result Value Ref Range Status   SARS Coronavirus 2 by RT PCR NEGATIVE NEGATIVE Final    Comment:  (NOTE) SARS-CoV-2 target nucleic acids are NOT DETECTED.  The SARS-CoV-2 RNA is generally detectable in upper respiratory specimens during the acute phase of infection. The lowest concentration of SARS-CoV-2 viral copies this assay can detect is 138 copies/mL. A negative result does not preclude SARS-Cov-2 infection and should not be used as the sole basis for treatment or other patient management decisions. A negative result may occur with  improper specimen collection/handling, submission of specimen other than nasopharyngeal swab, presence of viral mutation(s) within the areas targeted by this assay, and inadequate number of viral copies(<138 copies/mL). A negative result must be combined with clinical observations, patient history, and epidemiological information. The expected result is Negative.  Fact Sheet for Patients:  bloggercourse.com  Fact Sheet for Healthcare Providers:  seriousbroker.it  This test is no t yet approved or cleared by the [place]  FDA and  has been authorized for detection and/or diagnosis of SARS-CoV-2 by FDA under an Emergency Use Authorization (EUA). This EUA will remain  in effect (meaning this test can be used) for the duration of the COVID-19 declaration under Section 564(b)(1) of the Act, 21 U.S.C.section 360bbb-3(b)(1), unless the authorization is terminated  or revoked sooner.       Influenza A by PCR NEGATIVE NEGATIVE Final   Influenza  B by PCR NEGATIVE NEGATIVE Final    Comment: (NOTE) The Xpert Xpress SARS-CoV-2/FLU/RSV plus assay is intended as an aid in the diagnosis of influenza from Nasopharyngeal swab specimens and should not be used as a sole basis for treatment. Nasal washings and aspirates are unacceptable for Xpert Xpress SARS-CoV-2/FLU/RSV testing.  Fact Sheet for Patients: bloggercourse.com  Fact Sheet for Healthcare  Providers: seriousbroker.it  This test is not yet approved or cleared by the [place]  FDA and has been authorized for detection and/or diagnosis of SARS-CoV-2 by FDA under an Emergency Use Authorization (EUA). This EUA will remain in effect (meaning this test can be used) for the duration of the COVID-19 declaration under Section 564(b)(1) of the Act, 21 U.S.C. section 360bbb-3(b)(1), unless the authorization is terminated or revoked.     Resp Syncytial Virus by PCR NEGATIVE NEGATIVE Final    Comment: (NOTE) Fact Sheet for Patients: bloggercourse.com  Fact Sheet for Healthcare Providers: seriousbroker.it  This test is not yet approved or cleared by the [place]  FDA and has been authorized for detection and/or diagnosis of SARS-CoV-2 by FDA under an Emergency Use Authorization (EUA). This EUA will remain in effect (meaning this test can be used) for the duration of the COVID-19 declaration under Section 564(b)(1) of the Act, 21 U.S.C. section 360bbb-3(b)(1), unless the authorization is terminated or revoked.  Performed at [facility], [address], [place], [place] [address]   Blood Culture (routine x 2)     Status: Abnormal   Collection Time: 01/26/24 12:56 PM   Specimen: BLOOD  Result Value Ref Range Status   Specimen Description   Final    BLOOD BLOOD RIGHT FOREARM Performed at [facility], [address], [place], [place] [address]    Special Requests   Final    BOTTLES DRAWN AEROBIC AND ANAEROBIC Blood Culture results may not be optimal due to an inadequate volume of blood received in culture bottles Performed at [facility], [address] [place], [place] [address]    Culture  Setup Time   Final    GRAM NEGATIVE RODS AEROBIC BOTTLE ONLY CRITICAL VALUE NOTED.  VALUE IS CONSISTENT WITH PREVIOUSLY REPORTED AND CALLED VALUE. Performed at [facility], [address] [place], [place] [address]    Culture (A)  Final    PSEUDOMONAS AERUGINOSA SUSCEPTIBILITIES PERFORMED ON PREVIOUS CULTURE WITHIN THE LAST 5 DAYS. Performed at [facility], [address], [place], [place] [address]    Report Status 01/29/2024 FINAL  Final  Blood Culture (routine x 2)     Status: Abnormal   Collection Time: 01/26/24  1:01 PM   Specimen: BLOOD  Result Value Ref Range Status   Specimen Description   Final    BLOOD RIGHT ARM Performed at [facility], [address] [place], [place] [address]    Special Requests   Final    BOTTLES DRAWN AEROBIC AND ANAEROBIC Blood Culture adequate volume Performed at [facility], [address] [place], [place] [address]    Culture  Setup Time   Final    GRAM NEGATIVE RODS AEROBIC BOTTLE ONLY CRITICAL RESULT CALLED TO, READ BACK BY AND VERIFIED WITH:  NATHAN BELUE AT 0541 01/27/24 JG Performed at [facility], [address], [place], [place] [address]    Culture PSEUDOMONAS AERUGINOSA (A)  Final   Report Status 01/29/2024 FINAL  Final   Organism ID, Bacteria PSEUDOMONAS AERUGINOSA  Final  Susceptibility   Pseudomonas aeruginosa - MIC*    MEROPENEM INTERMEDIATE Intermediate     CIPROFLOXACIN 0.5 SENSITIVE Sensitive     IMIPENEM >=16 RESISTANT Resistant     PIP/TAZO Value in next row Sensitive      8 SENSITIVEThis is a modified FDA-approved test that has been validated and its performance characteristics determined by the reporting laboratory.  This laboratory is certified under the Clinical Laboratory Improvement Amendments CLIA as qualified to perform high complexity clinical laboratory testing.    CEFEPIME Value in next row Sensitive      8 SENSITIVEThis is a modified FDA-approved test that has been validated and its performance characteristics determined by the reporting laboratory.  This laboratory is certified under the Clinical Laboratory Improvement  Amendments CLIA as qualified to perform high complexity clinical laboratory testing.    CEFTAZIDIME/AVIBACTAM Value in next row Sensitive      8 SENSITIVEThis is a modified FDA-approved test that has been validated and its performance characteristics determined by the reporting laboratory.  This laboratory is certified under the Clinical Laboratory Improvement Amendments CLIA as qualified to perform high complexity clinical laboratory testing.    CEFTOLOZANE/TAZOBACTAM Value in next row Sensitive      8 SENSITIVEThis is a modified FDA-approved test that has been validated and its performance characteristics determined by the reporting laboratory.  This laboratory is certified under the Clinical Laboratory Improvement Amendments CLIA as qualified to perform high complexity clinical laboratory testing.    TOBRAMYCIN Value in next row Sensitive      8 SENSITIVEThis is a modified FDA-approved test that has been validated and its performance characteristics determined by the reporting laboratory.  This laboratory is certified under the Clinical Laboratory Improvement Amendments CLIA as qualified to perform high complexity clinical laboratory testing.    CEFTAZIDIME Value in next row Sensitive      8 SENSITIVEThis is a modified FDA-approved test that has been validated and its performance characteristics determined by the reporting laboratory.  This laboratory is certified under the Clinical Laboratory Improvement Amendments CLIA as qualified to perform high complexity clinical laboratory testing.    * PSEUDOMONAS AERUGINOSA  Blood Culture ID Panel (Reflexed)     Status: Abnormal   Collection Time: 01/26/24  1:01 PM  Result Value Ref Range Status   Enterococcus faecalis NOT DETECTED NOT DETECTED Final   Enterococcus Faecium NOT DETECTED NOT DETECTED Final   Listeria monocytogenes NOT DETECTED NOT DETECTED Final   Staphylococcus species NOT DETECTED NOT DETECTED Final   Staphylococcus aureus (BCID) NOT  DETECTED NOT DETECTED Final   Staphylococcus epidermidis NOT DETECTED NOT DETECTED Final   Staphylococcus lugdunensis NOT DETECTED NOT DETECTED Final   Streptococcus species NOT DETECTED NOT DETECTED Final   Streptococcus agalactiae NOT DETECTED NOT DETECTED Final   Streptococcus pneumoniae NOT DETECTED NOT DETECTED Final   Streptococcus pyogenes NOT DETECTED NOT DETECTED Final   A.calcoaceticus-baumannii NOT DETECTED NOT DETECTED Final   Bacteroides fragilis NOT DETECTED NOT DETECTED Final   Enterobacterales NOT DETECTED NOT DETECTED Final   Enterobacter cloacae complex NOT DETECTED NOT DETECTED Final   Escherichia coli NOT DETECTED NOT DETECTED Final   Klebsiella aerogenes NOT DETECTED NOT DETECTED Final   Klebsiella oxytoca NOT DETECTED NOT DETECTED Final   Klebsiella pneumoniae NOT DETECTED NOT DETECTED Final   Proteus species NOT DETECTED NOT DETECTED Final   Salmonella species NOT DETECTED NOT DETECTED Final   Serratia marcescens NOT DETECTED NOT DETECTED Final   Haemophilus influenzae NOT DETECTED NOT DETECTED  Final   Neisseria meningitidis NOT DETECTED NOT DETECTED Final   Pseudomonas aeruginosa DETECTED (A) NOT DETECTED Final    Comment: CRITICAL RESULT CALLED TO, READ BACK BY AND VERIFIED WITH:  NATHAN BELUE AT 0541 01/27/24 JG    Stenotrophomonas maltophilia NOT DETECTED NOT DETECTED Final   Candida albicans NOT DETECTED NOT DETECTED Final   Candida auris NOT DETECTED NOT DETECTED Final   Candida glabrata NOT DETECTED NOT DETECTED Final   Candida krusei NOT DETECTED NOT DETECTED Final   Candida parapsilosis NOT DETECTED NOT DETECTED Final   Candida tropicalis NOT DETECTED NOT DETECTED Final   Cryptococcus neoformans/gattii NOT DETECTED NOT DETECTED Final   CTX-M ESBL NOT DETECTED NOT DETECTED Final   Carbapenem resistance IMP NOT DETECTED NOT DETECTED Final   Carbapenem resistance KPC NOT DETECTED NOT DETECTED Final   Carbapenem resistance NDM NOT DETECTED NOT DETECTED  Final   Carbapenem resistance VIM NOT DETECTED NOT DETECTED Final    Comment: Performed at [facility], [address] [place], [place] [address]  Culture, blood (Routine X 2) w Reflex to ID Panel     Status: None (Preliminary result)   Collection Time: 01/29/24  4:38 AM   Specimen: BLOOD  Result Value Ref Range Status   Specimen Description BLOOD BLOOD LEFT ARM  Final   Special Requests   Final    BOTTLES DRAWN AEROBIC AND ANAEROBIC Blood Culture adequate volume   Culture   Final    NO GROWTH <12 HOURS Performed at [facility], [address] [place], [place] [address]    Report Status PENDING  Incomplete  Culture, blood (Routine X 2) w Reflex to ID Panel     Status: None (Preliminary result)   Collection Time: 01/29/24  4:43 AM   Specimen: BLOOD  Result Value Ref Range Status   Specimen Description BLOOD BLOOD LEFT HAND  Final   Special Requests   Final    BOTTLES DRAWN AEROBIC AND ANAEROBIC Blood Culture adequate volume   Culture   Final    NO GROWTH <12 HOURS Performed at [facility], [address] [place], [place] [address]    Report Status PENDING  Incomplete      Radiology Studies last 3 days: DG Chest Port 1 View Result Date: 01/28/2024 CLINICAL DATA:  Shortness of breath EXAM: PORTABLE CHEST 1 VIEW COMPARISON:  Chest x-ray 01/26/2024 FINDINGS: Heart is enlarged. There central pulmonary vascular congestion. There some minimal patchy opacities in the right mid lung. The costophrenic angles are clear. No pneumothorax or acute fracture. There are degenerative changes of the left shoulder. IMPRESSION: 1. Cardiomegaly with central pulmonary vascular congestion. 2. Minimal patchy opacities in the right mid lung may represent atelectasis or infection. Electronically Signed   By: Greig Pique M.D.   On: 01/28/2024 17:49   ECHOCARDIOGRAM COMPLETE Result Date: 01/28/2024    ECHOCARDIOGRAM REPORT   Patient Name:   BELVIA GOTSCHALL Date of Exam:  01/28/2024 Medical Rec #:  [id number]         Height:       64.0 in Accession #:    [id number]        Weight:       308.6 lb Date of Birth:  Nov 25, 1940         BSA:          2.353 m Patient Age:    83 years          BP:  117/59 mmHg Patient Gender: F                 HR:           89 bpm. Exam Location:  [facility] Procedure: 2D Echo, Cardiac Doppler and Color Doppler (Both Spectral and Color            Flow Doppler were utilized during procedure). Indications:     Abnormal ECG R94.31  History:         Patient has no prior history of Echocardiogram examinations.                  COPD; Risk Factors:Hypertension and Diabetes.  Sonographer:     Christopher Furnace Referring Phys:  [id number] [facility] Diagnosing Phys: Marsa Dooms MD  Sonographer Comments: Technically challenging study due to limited acoustic windows, patient is obese and no apical window. IMPRESSIONS  1. Left ventricular ejection fraction, by estimation, is 60 to 65%. The left ventricle has normal function. The left ventricle has no regional wall motion abnormalities. Left ventricular diastolic parameters are indeterminate.  2. Right ventricular systolic function is normal. The right ventricular size is normal.  3. The mitral valve is normal in structure. Mild mitral valve regurgitation. No evidence of mitral stenosis.  4. The aortic valve is normal in structure. Aortic valve regurgitation is not visualized. No aortic stenosis is present.  5. The inferior vena cava is normal in size with greater than 50% respiratory variability, suggesting right atrial pressure of 3 mmHg. FINDINGS  Left Ventricle: Left ventricular ejection fraction, by estimation, is 60 to 65%. The left ventricle has normal function. The left ventricle has no regional wall motion abnormalities. Strain was performed and the global longitudinal strain is indeterminate. The left ventricular internal cavity size was normal in size. There is no left ventricular hypertrophy. Left ventricular  diastolic parameters are indeterminate. Right Ventricle: The right ventricular size is normal. No increase in right ventricular wall thickness. Right ventricular systolic function is normal. Left Atrium: Left atrial size was normal in size. Right Atrium: Right atrial size was normal in size. Pericardium: There is no evidence of pericardial effusion. Mitral Valve: The mitral valve is normal in structure. Mild mitral valve regurgitation. No evidence of mitral valve stenosis. Tricuspid Valve: The tricuspid valve is normal in structure. Tricuspid valve regurgitation is mild . No evidence of tricuspid stenosis. Aortic Valve: The aortic valve is normal in structure. Aortic valve regurgitation is not visualized. No aortic stenosis is present. Pulmonic Valve: The pulmonic valve was normal in structure. Pulmonic valve regurgitation is not visualized. No evidence of pulmonic stenosis. Aorta: The aortic root is normal in size and structure. Venous: The inferior vena cava is normal in size with greater than 50% respiratory variability, suggesting right atrial pressure of 3 mmHg. IAS/Shunts: No atrial level shunt detected by color flow Doppler. Additional Comments: 3D was performed not requiring image post processing on an independent workstation and was indeterminate.  LEFT VENTRICLE PLAX 2D LVIDd:         4.80 cm LVIDs:         2.80 cm LV PW:         1.00 cm LV IVS:        1.10 cm LVOT diam:     2.00 cm LVOT Area:     3.14 cm  LEFT ATRIUM         Index LA diam:    2.90 cm 1.23 cm/m   AORTA Ao Root  diam: 2.70 cm  SHUNTS Systemic Diam: 2.00 cm Marsa Dooms MD Electronically signed by Marsa Dooms MD Signature Date/Time: 01/28/2024/1:33:28 PM    Final    US  Venous Img Lower Unilateral Left (DVT) Result Date: 01/27/2024 EXAM: ULTRASOUND DUPLEX OF THE LEFT LOWER EXTREMITY VEINS TECHNIQUE: Duplex ultrasound using B-mode/gray scaled imaging and Doppler spectral analysis and color flow was obtained of the deep  venous structures of the left lower extremity. COMPARISON: None available. CLINICAL HISTORY: Swelling. FINDINGS: There is no evidence of deep venous thrombosis within the left lower extremity. The common femoral vein, femoral vein, and popliteal vein of the left lower extremity demonstrate normal compressibility with normal color flow and spectral analysis. There is limited visualization of the left posterior tibial veins and the left peroneal veins are not identified. IMPRESSION: 1. No evidence of DVT in the left lower extremity. 2. Limited visualization of the left posterior tibial veins and the left peroneal veins are not identified. Electronically signed by: Evalene Coho MD 01/27/2024 05:43 AM EST RP Workstation: [id number]   DG Chest Port 1 View Result Date: 01/26/2024 CLINICAL DATA:  Questionable sepsis EXAM: PORTABLE CHEST 1 VIEW COMPARISON:  Chest radiograph dated 07/07/2019. FINDINGS: No focal consolidation, pleural effusion or pneumothorax. Top-normal cardiac size. No acute osseous pathology. IMPRESSION: No active disease. Electronically Signed   By: Vanetta Chou M.D.   On: 01/26/2024 13:58        Laneta Marsa, DO [facility] 01/29/2024, 2:55 PM    Dictation software may have been used to generate the above note. Typos may occur and escape review in typed/dictated notes. Please contact Dr Marsa directly for clarity if needed.  Staff may message me via secure chat in Epic  but this may not receive an immediate response,  please page me for urgent matters!  If 7PM-7AM, please contact night coverage [email or web address]

## 2024-01-29 NOTE — Progress Notes (Signed)
 [facility] CLINIC CARDIOLOGY CONSULT NOTE       Patient ID: Kelly Hogan MRN: [id number] DOB/AGE: 1940-10-27 83 y.o.  Admit date: 01/26/2024 Referring Physician Dr. Laneta Blunt Primary Physician [name], [name] LABOR, MD  Primary Cardiologist Damien Lee, NP Reason for Consultation AF, bacteremia  HPI: Kelly Hogan is a 83 y.o. female  with a past medical history of COPD, chronic respiratory failure on 4 L, hypertension, hyperlipidemia, type 2 diabetes, obesity, obesity hypoventilation syndrome, history of venous insufficiency who presented to the ED on 01/26/2024 for concern for leg infection.  Noted to be in atrial fibrillation RVR initially, placed on IV amiodarone and ultimately converted to normal sinus rhythm.  Blood cultures were positive for Pseudomonas.  Cardiology was consulted for further evaluation.   Interval history: - Patient seen and examined this morning, resting comfortably in bed off of BiPAP. - States that she is having back pain this morning.  Denies any worsening shortness of breath or chest pain. - Remains in sinus rhythm. Will transition to PO amiodarone today.  Review of systems complete and found to be negative unless listed above    Past Medical History:  Diagnosis Date   Asthma    COPD (chronic obstructive pulmonary disease) (HCC)    Diabetes mellitus without complication (HCC)    Hypertension    Morbid obesity with BMI of 50.0-59.9, adult (HCC) 07/13/2019   Stomach ulcer     No past surgical history on file.  Medications Prior to Admission  Medication Sig Dispense Refill Last Dose/Taking   acetaminophen  (TYLENOL ) 500 MG tablet Take 500-1,000 mg by mouth every 6 (six) hours as needed for mild pain or fever.    01/26/2024   albuterol  (PROVENTIL ) (2.5 MG/3ML) 0.083% nebulizer solution Take 3 mLs by nebulization every 6 (six) hours as needed for wheezing.   01/26/2024   aspirin  81 MG EC tablet Take 81 mg by mouth daily.   01/25/2024   carvedilol   (COREG ) 25 MG tablet Take 12.5 mg by mouth in the morning and at bedtime.   01/25/2024   Cholecalciferol  25 MCG (1000 UT) tablet Take 1,000 Units by mouth daily.   01/25/2024   ciclopirox (LOPROX) 0.77 % cream Apply 0.77 % topically 2 (two) times daily.   01/25/2024   diclofenac Sodium (VOLTAREN) 1 % GEL Apply 2 g topically 4 (four) times daily.   01/26/2024   docusate sodium  (COLACE) 100 MG capsule Take 100 mg by mouth 2 (two) times daily.  4 01/26/2024   empagliflozin (JARDIANCE) 10 MG TABS tablet Take 1 tablet by mouth daily.   01/25/2024   fluticasone  (FLONASE ) 50 MCG/ACT nasal spray Place 1 spray into both nostrils daily.    01/25/2024   gatifloxacin (ZYMAXID) 0.5 % SOLN Place 1 drop into the left eye 4 (four) times daily.   Past Month   ketorolac (ACULAR) 0.5 % ophthalmic solution Place 1 drop into the left eye 2 (two) times daily.   Unknown   Lidocaine 4 % PTCH Place onto the skin.   Past Month   olmesartan (BENICAR) 5 MG tablet Take 5 mg by mouth daily.   01/25/2024   omeprazole (PRILOSEC) 20 MG capsule Take 20 mg by mouth daily.   01/25/2024   Oyster Shell 500 MG TABS Take 500 mg by mouth in the morning, at noon, and at bedtime.   01/25/2024   pravastatin (PRAVACHOL) 20 MG tablet Take 20 mg by mouth daily.   01/25/2024   prednisoLONE acetate (PRED FORTE) 1 %  ophthalmic suspension Place 1 drop into the left eye 4 (four) times daily.   Past Month   PROAIR  HFA 108 (90 Base) MCG/ACT inhaler Inhale 2 puffs into the lungs every 4 (four) hours as needed for wheezing or shortness of breath.    01/26/2024   senna (SENOKOT) 8.6 MG tablet Take 1 tablet by mouth daily.   01/25/2024   Torsemide 40 MG TABS Take 40 mg by mouth 2 (two) times daily.   01/25/2024   TRELEGY ELLIPTA 100-62.5-25 MCG/INH AEPB Take 1 puff by mouth daily.   01/25/2024   amLODipine (NORVASC) 5 MG tablet Take 5 mg by mouth daily. (Patient not taking: Reported on 01/26/2024)   Not Taking   ferrous sulfate 324 MG TBEC Take 324 mg  by mouth.   Unknown   furosemide  (LASIX ) 40 MG tablet Take 40-80 mg by mouth as directed.  (Patient not taking: Reported on 01/26/2024)   Not Taking   hydrALAZINE  (APRESOLINE ) 25 MG tablet Take 25 mg by mouth 2 (two) times daily. (Patient not taking: Reported on 01/26/2024)   Not Taking   ipratropium-albuterol  (DUONEB) 0.5-2.5 (3) MG/3ML SOLN Take 3 mLs by nebulization every 6 (six) hours as needed (wheezing). (Patient not taking: Reported on 01/26/2024)   Not Taking   omeprazole (PRILOSEC) 20 MG capsule Take by mouth.      spironolactone  (ALDACTONE ) 25 MG tablet Take 25 mg by mouth daily. (Patient not taking: Reported on 01/26/2024)  3 Not Taking   telmisartan (MICARDIS) 80 MG tablet Take 80 mg by mouth daily. (Patient not taking: Reported on 01/26/2024)   Not Taking   Social History   Socioeconomic History   Marital status: Single    Spouse name: Not on file   Number of children: Not on file   Years of education: Not on file   Highest education level: Not on file  Occupational History   Not on file  Tobacco Use   Smoking status: Former    Types: Cigarettes   Smokeless tobacco: Never  Substance and Sexual Activity   Alcohol use: No   Drug use: No   Sexual activity: Not on file  Other Topics Concern   Not on file  Social History Narrative   Not on file   Social Drivers of Health   Financial Resource Strain: Low Risk (08/12/2023)   Received from [facility]   Overall Financial Resource Strain (CARDIA)    Difficulty of Paying Living Expenses: Not very hard  Food Insecurity: Patient Declined (01/27/2024)   Hunger Vital Sign    Worried About Running Out of Food in the Last Year: Patient declined    Ran Out of Food in the Last Year: Patient declined  Transportation Needs: Patient Declined (01/27/2024)   PRAPARE - Administrator, Civil Service (Medical): Patient declined    Lack of Transportation (Non-Medical): Patient declined  Physical Activity: Not on file   Stress: No Stress Concern Present (11/17/2023)   Received from [facility] - Occupational Stress Questionnaire    Do you feel stress - tense, restless, nervous, or anxious, or unable to sleep at night because your mind is troubled all the time - these days?: Only a little  Social Connections: Patient Declined (01/27/2024)   Social Connection and Isolation Panel    Frequency of Communication with Friends and Family: Patient declined    Frequency of Social Gatherings with Friends and Family: Patient declined  Attends Religious Services: Patient declined    Active Member of Clubs or Organizations: Patient declined    Attends Banker Meetings: Patient declined    Marital Status: Patient declined  Intimate Partner Violence: Patient Declined (01/27/2024)   Humiliation, Afraid, Rape, and Kick questionnaire    Fear of Current or Ex-Partner: Patient declined    Emotionally Abused: Patient declined    Physically Abused: Patient declined    Sexually Abused: Patient declined    Family History  Problem Relation Age of Onset   Hypertension Sister    Diabetes Mellitus II Brother      Vitals:   01/29/24 0832 01/29/24 0900 01/29/24 0936 01/29/24 1000  BP:  135/62    Pulse: 80  72 77  Resp: 16 20 13 15   Temp:      TempSrc:      SpO2: 99%  97% 98%  Weight:      Height:        PHYSICAL EXAM General: Chronically ill appearing female, well nourished, in no acute distress. HEENT: Normocephalic and atraumatic. Neck: No JVD.  Lungs: Normal respiratory effort on 2L. Coarse breath sounds bilaterally.  Heart: HRRR. Normal S1 and S2 without gallops or murmurs.  Abdomen: Non-distended appearing.  Msk: Normal strength and tone for age. Extremities: Warm and well perfused. No clubbing, cyanosis. Bilateral LE with edema and erythema, wraps in place.  Neuro: Alert and oriented X 3. Psych: Answers questions appropriately.   Labs: Basic  Metabolic Panel: Recent Labs    01/26/24 1400 01/27/24 0456 01/28/24 0409 01/29/24 0438  NA 139   < > 136 139  K 4.7   < > 4.9 4.7  CL 102   < > 99 99  CO2 24   < > 27 27  GLUCOSE 133*   < > 92 85  BUN 54*   < > 63* 65*  CREATININE 2.03*   < > 2.78* 2.86*  CALCIUM  9.1   < > 8.9 9.7  MG 2.2  --   --   --    < > = values in this interval not displayed.   Liver Function Tests: Recent Labs    01/26/24 1400  AST 16  ALT 8  ALKPHOS 45  BILITOT 0.4  PROT 6.8  ALBUMIN 3.8   No results for input(s): LIPASE, AMYLASE in the last 72 hours. CBC: Recent Labs    01/26/24 1256 01/27/24 0456 01/28/24 0409 01/29/24 0438  WBC 8.5   < > 21.6* 16.8*  NEUTROABS 7.9*  --   --   --   HGB 10.6*   < > 8.1* 8.2*  HCT 35.0*   < > 27.3* 28.0*  MCV 93.1   < > 96.5 95.2  PLT 325   < > 232 248   < > = values in this interval not displayed.   Cardiac Enzymes: No results for input(s): CKTOTAL, CKMB, CKMBINDEX, TROPONINIHS in the last 72 hours. BNP: No results for input(s): BNP in the last 72 hours. D-Dimer: No results for input(s): DDIMER in the last 72 hours. Hemoglobin A1C: No results for input(s): HGBA1C in the last 72 hours. Fasting Lipid Panel: No results for input(s): CHOL, HDL, LDLCALC, TRIG, CHOLHDL, LDLDIRECT in the last 72 hours. Thyroid Function Tests: Recent Labs    01/26/24 1256  TSH 5.650*   Anemia Panel: No results for input(s): VITAMINB12, FOLATE, FERRITIN, TIBC, IRON, RETICCTPCT in the last 72 hours.   Radiology: [facility] 1 View Result  Date: 01/28/2024 CLINICAL DATA:  Shortness of breath EXAM: PORTABLE CHEST 1 VIEW COMPARISON:  Chest x-ray 01/26/2024 FINDINGS: Heart is enlarged. There central pulmonary vascular congestion. There some minimal patchy opacities in the right mid lung. The costophrenic angles are clear. No pneumothorax or acute fracture. There are degenerative changes of the left shoulder. IMPRESSION: 1.  Cardiomegaly with central pulmonary vascular congestion. 2. Minimal patchy opacities in the right mid lung may represent atelectasis or infection. Electronically Signed   By: Greig Pique M.D.   On: 01/28/2024 17:49   ECHOCARDIOGRAM COMPLETE Result Date: 01/28/2024    ECHOCARDIOGRAM REPORT   Patient Name:   Kelly Hogan Date of Exam: 01/28/2024 Medical Rec #:  [id number]         Height:       64.0 in Accession #:    [id number]        Weight:       308.6 lb Date of Birth:  Jun 24, 1940         BSA:          2.353 m Patient Age:    83 years          BP:           117/59 mmHg Patient Gender: F                 HR:           89 bpm. Exam Location:  [facility] Procedure: 2D Echo, Cardiac Doppler and Color Doppler (Both Spectral and Color            Flow Doppler were utilized during procedure). Indications:     Abnormal ECG R94.31  History:         Patient has no prior history of Echocardiogram examinations.                  COPD; Risk Factors:Hypertension and Diabetes.  Sonographer:     Christopher Furnace Referring Phys:  [id number] [facility] Diagnosing Phys: Marsa Dooms MD  Sonographer Comments: Technically challenging study due to limited acoustic windows, patient is obese and no apical window. IMPRESSIONS  1. Left ventricular ejection fraction, by estimation, is 60 to 65%. The left ventricle has normal function. The left ventricle has no regional wall motion abnormalities. Left ventricular diastolic parameters are indeterminate.  2. Right ventricular systolic function is normal. The right ventricular size is normal.  3. The mitral valve is normal in structure. Mild mitral valve regurgitation. No evidence of mitral stenosis.  4. The aortic valve is normal in structure. Aortic valve regurgitation is not visualized. No aortic stenosis is present.  5. The inferior vena cava is normal in size with greater than 50% respiratory variability, suggesting right atrial pressure of 3 mmHg. FINDINGS  Left Ventricle: Left ventricular  ejection fraction, by estimation, is 60 to 65%. The left ventricle has normal function. The left ventricle has no regional wall motion abnormalities. Strain was performed and the global longitudinal strain is indeterminate. The left ventricular internal cavity size was normal in size. There is no left ventricular hypertrophy. Left ventricular diastolic parameters are indeterminate. Right Ventricle: The right ventricular size is normal. No increase in right ventricular wall thickness. Right ventricular systolic function is normal. Left Atrium: Left atrial size was normal in size. Right Atrium: Right atrial size was normal in size. Pericardium: There is no evidence of pericardial effusion. Mitral Valve: The mitral valve is normal in structure. Mild mitral valve regurgitation. No evidence of mitral  valve stenosis. Tricuspid Valve: The tricuspid valve is normal in structure. Tricuspid valve regurgitation is mild . No evidence of tricuspid stenosis. Aortic Valve: The aortic valve is normal in structure. Aortic valve regurgitation is not visualized. No aortic stenosis is present. Pulmonic Valve: The pulmonic valve was normal in structure. Pulmonic valve regurgitation is not visualized. No evidence of pulmonic stenosis. Aorta: The aortic root is normal in size and structure. Venous: The inferior vena cava is normal in size with greater than 50% respiratory variability, suggesting right atrial pressure of 3 mmHg. IAS/Shunts: No atrial level shunt detected by color flow Doppler. Additional Comments: 3D was performed not requiring image post processing on an independent workstation and was indeterminate.  LEFT VENTRICLE PLAX 2D LVIDd:         4.80 cm LVIDs:         2.80 cm LV PW:         1.00 cm LV IVS:        1.10 cm LVOT diam:     2.00 cm LVOT Area:     3.14 cm  LEFT ATRIUM         Index LA diam:    2.90 cm 1.23 cm/m   AORTA Ao Root diam: 2.70 cm  SHUNTS Systemic Diam: 2.00 cm Marsa Dooms MD Electronically signed  by Marsa Dooms MD Signature Date/Time: 01/28/2024/1:33:28 PM    Final    US  Venous Img Lower Unilateral Left (DVT) Result Date: 01/27/2024 EXAM: ULTRASOUND DUPLEX OF THE LEFT LOWER EXTREMITY VEINS TECHNIQUE: Duplex ultrasound using B-mode/gray scaled imaging and Doppler spectral analysis and color flow was obtained of the deep venous structures of the left lower extremity. COMPARISON: None available. CLINICAL HISTORY: Swelling. FINDINGS: There is no evidence of deep venous thrombosis within the left lower extremity. The common femoral vein, femoral vein, and popliteal vein of the left lower extremity demonstrate normal compressibility with normal color flow and spectral analysis. There is limited visualization of the left posterior tibial veins and the left peroneal veins are not identified. IMPRESSION: 1. No evidence of DVT in the left lower extremity. 2. Limited visualization of the left posterior tibial veins and the left peroneal veins are not identified. Electronically signed by: Evalene Coho MD 01/27/2024 05:43 AM EST RP Workstation: [id number]   DG Chest Port 1 View Result Date: 01/26/2024 CLINICAL DATA:  Questionable sepsis EXAM: PORTABLE CHEST 1 VIEW COMPARISON:  Chest radiograph dated 07/07/2019. FINDINGS: No focal consolidation, pleural effusion or pneumothorax. Top-normal cardiac size. No acute osseous pathology. IMPRESSION: No active disease. Electronically Signed   By: Vanetta Chou M.D.   On: 01/26/2024 13:58    ECHO as above  TELEMETRY (personally reviewed): Sinus rhythm rate 80s  EKG (personally reviewed): Sinus rhythm rate 81 bpm  Data reviewed by me 01/29/2024: last 24h vitals tele labs imaging I/O ED provider note, admission H&P, hospitalist progress note, ID note, nephrology note  Principal Problem:   Sepsis due to cellulitis [facility]) Active Problems:   Essential (primary) hypertension   Type 2 diabetes mellitus (HCC)   Chronic venous insufficiency   HLD  (hyperlipidemia)   Morbid obesity with BMI of 50.0-59.9, adult (HCC)   OSA (obstructive sleep apnea)   Obesity hypoventilation syndrome (HCC)   (HFpEF) heart failure with preserved ejection fraction (HCC)    ASSESSMENT AND PLAN:  Kelly Hogan is a 83 y.o. female  with a past medical history of COPD, chronic respiratory failure on 4 L, hypertension, hyperlipidemia, type 2 diabetes, obesity, obesity  hypoventilation syndrome, history of venous insufficiency who presented to the ED on 01/26/2024 for concern for leg infection.  Noted to be in atrial fibrillation RVR initially, placed on IV amiodarone and ultimately converted to normal sinus rhythm.  Blood cultures were positive for Pseudomonas.  Cardiology was consulted for further evaluation.   # Acute on chronic hypoxic respiratory failure # Atrial fibrillation RVR # New onset atrial fibrillation # Cellulitis # Pseudomonas bacteremia # Hypertension Patient sent to the ED for evaluation of cellulitis, ultimately developed atrial fibrillation RVR which was treated with IV amiodarone and she converted to normal sinus rhythm yesterday.  Found to have positive blood cultures growing Pseudomonas.  Yesterday evening developed worsening hypoxia and was placed on BiPAP. - Will transition to p.o. amiodarone load with 400 mg twice daily for 7 days followed by 200 mg daily. - Continue Eliquis 2.5 mg twice daily (dose appropriate given age, renal function). - Continue pravastatin 20 mg daily. - TEE will likely be needed given bacteremia however patient is not a candidate at this time given her respiratory status.  Will likely consider next week. - Further management of cellulitis, bacteremia as per primary team.  This patient's plan of care was discussed and created with Dr. Ammon and he is in agreement.  Signed: Danita Bloch, PA-C  01/29/2024, 11:13 AM [facility] Cardiology

## 2024-01-29 NOTE — Progress Notes (Signed)
 [facility]  ROUNDING NOTE   Subjective:   Patient seen sitting up in bed Alert Requesting food 4L [place]  UOP Creatinine 2.86  Objective:  Vital signs in last 24 hours:  Temp:  [98.1 F (36.7 C)-98.9 F (37.2 C)] 98.1 F (36.7 C) (11/14 1154) Pulse Rate:  [72-86] 72 (11/14 1200) Resp:  [13-22] 13 (11/14 1200) BP: (93-135)/(42-71) 106/43 (11/14 1200) SpO2:  [97 %-100 %] 98 % (11/14 1200) FiO2 (%):  [40 %] 40 % (11/14 0720) Weight:  [140 kg] 140 kg (11/14 0357)  Weight change: 0 kg Filed Weights   01/26/24 1256 01/28/24 0500 01/29/24 0357  Weight: (!) 136.5 kg (!) 140 kg (!) 140 kg    Intake/Output: I/O last 3 completed shifts: In: 2103 [I.V.:694.6; IV Piggyback:1408.3] Out: 2450 [Urine:2450]   Intake/Output this shift:  Total I/O In: 150 [I.V.:50; IV Piggyback:100] Out: -   Physical Exam: General: NAD  Head: Normocephalic, atraumatic. Moist oral mucosal membranes  Eyes: Anicteric  Lungs:  Clear to auscultation, 4L [place]  Heart: Regular rate and rhythm  Abdomen:  Soft, nontender  Extremities:  No peripheral edema.  Neurologic: Awake, alert, conversant  Skin: Warm,dry, no rash  Access: None     Basic Metabolic Panel: Recent Labs  Lab 01/26/24 1400 01/27/24 0456 01/27/24 1706 01/28/24 0409 01/29/24 0438  NA 139 138 137 136 139  K 4.7 5.6* 5.4* 4.9 4.7  CL 102 100 99 99 99  CO2 24 27 26 27 27   GLUCOSE 133* 102* 80 92 85  BUN 54* 58* 60* 63* 65*  CREATININE 2.03* 2.59* 2.70* 2.78* 2.86*  CALCIUM  9.1 9.2 9.3 8.9 9.7  MG 2.2  --   --   --   --     Liver Function Tests: Recent Labs  Lab 01/26/24 1400  AST 16  ALT 8  ALKPHOS 45  BILITOT 0.4  PROT 6.8  ALBUMIN 3.8   No results for input(s): LIPASE, AMYLASE in the last 168 hours. No results for input(s): AMMONIA in the last 168 hours.  CBC: Recent Labs  Lab 01/26/24 1256 01/27/24 0456 01/28/24 0409 01/29/24 0438  WBC 8.5 24.0* 21.6* 16.8*  NEUTROABS 7.9*  --   --    --   HGB 10.6* 8.7* 8.1* 8.2*  HCT 35.0* 29.4* 27.3* 28.0*  MCV 93.1 96.4 96.5 95.2  PLT 325 249 232 248    Cardiac Enzymes: No results for input(s): CKTOTAL, CKMB, CKMBINDEX, TROPONINI in the last 168 hours.  BNP: Invalid input(s): POCBNP  CBG: Recent Labs  Lab 01/28/24 1151 01/28/24 1655 01/28/24 2052 01/29/24 0801 01/29/24 1147  GLUCAP 101* 80 85 89 84    Microbiology: Results for orders placed or performed during the hospital encounter of 01/26/24  Resp panel by RT-PCR (RSV, Flu A&B, Covid) Anterior Nasal Swab     Status: None   Collection Time: 01/26/24 12:56 PM   Specimen: Anterior Nasal Swab  Result Value Ref Range Status   SARS Coronavirus 2 by RT PCR NEGATIVE NEGATIVE Final    Comment: (NOTE) SARS-CoV-2 target nucleic acids are NOT DETECTED.  The SARS-CoV-2 RNA is generally detectable in upper respiratory specimens during the acute phase of infection. The lowest concentration of SARS-CoV-2 viral copies this assay can detect is 138 copies/mL. A negative result does not preclude SARS-Cov-2 infection and should not be used as the sole basis for treatment or other patient management decisions. A negative result may occur with  improper specimen collection/handling, submission of  specimen other than nasopharyngeal swab, presence of viral mutation(s) within the areas targeted by this assay, and inadequate number of viral copies(<138 copies/mL). A negative result must be combined with clinical observations, patient history, and epidemiological information. The expected result is Negative.  Fact Sheet for Patients:  bloggercourse.com  Fact Sheet for Healthcare Providers:  seriousbroker.it  This test is no t yet approved or cleared by the [place]  FDA and  has been authorized for detection and/or diagnosis of SARS-CoV-2 by FDA under an Emergency Use Authorization (EUA). This EUA will remain  in  effect (meaning this test can be used) for the duration of the COVID-19 declaration under Section 564(b)(1) of the Act, 21 U.S.C.section 360bbb-3(b)(1), unless the authorization is terminated  or revoked sooner.       Influenza A by PCR NEGATIVE NEGATIVE Final   Influenza B by PCR NEGATIVE NEGATIVE Final    Comment: (NOTE) The Xpert Xpress SARS-CoV-2/FLU/RSV plus assay is intended as an aid in the diagnosis of influenza from Nasopharyngeal swab specimens and should not be used as a sole basis for treatment. Nasal washings and aspirates are unacceptable for Xpert Xpress SARS-CoV-2/FLU/RSV testing.  Fact Sheet for Patients: bloggercourse.com  Fact Sheet for Healthcare Providers: seriousbroker.it  This test is not yet approved or cleared by the [place]  FDA and has been authorized for detection and/or diagnosis of SARS-CoV-2 by FDA under an Emergency Use Authorization (EUA). This EUA will remain in effect (meaning this test can be used) for the duration of the COVID-19 declaration under Section 564(b)(1) of the Act, 21 U.S.C. section 360bbb-3(b)(1), unless the authorization is terminated or revoked.     Resp Syncytial Virus by PCR NEGATIVE NEGATIVE Final    Comment: (NOTE) Fact Sheet for Patients: bloggercourse.com  Fact Sheet for Healthcare Providers: seriousbroker.it  This test is not yet approved or cleared by the [place]  FDA and has been authorized for detection and/or diagnosis of SARS-CoV-2 by FDA under an Emergency Use Authorization (EUA). This EUA will remain in effect (meaning this test can be used) for the duration of the COVID-19 declaration under Section 564(b)(1) of the Act, 21 U.S.C. section 360bbb-3(b)(1), unless the authorization is terminated or revoked.  Performed at [facility], [address], [place], [place] [address]   Blood  Culture (routine x 2)     Status: Abnormal   Collection Time: 01/26/24 12:56 PM   Specimen: BLOOD  Result Value Ref Range Status   Specimen Description   Final    BLOOD BLOOD RIGHT FOREARM Performed at [facility], [address] [place], [place] [address]    Special Requests   Final    BOTTLES DRAWN AEROBIC AND ANAEROBIC Blood Culture results may not be optimal due to an inadequate volume of blood received in culture bottles Performed at [facility], [address] [place], [place] [address]    Culture  Setup Time   Final    GRAM NEGATIVE RODS AEROBIC BOTTLE ONLY CRITICAL VALUE NOTED.  VALUE IS CONSISTENT WITH PREVIOUSLY REPORTED AND CALLED VALUE. Performed at [facility], [address] [place], [place] [address]    Culture (A)  Final    PSEUDOMONAS AERUGINOSA SUSCEPTIBILITIES PERFORMED ON PREVIOUS CULTURE WITHIN THE LAST 5 DAYS. Performed at [facility], [address] [place], [place] [address]    Report Status 01/29/2024 FINAL  Final  Blood Culture (routine x 2)     Status: Abnormal   Collection Time: 01/26/24  1:01  PM   Specimen: BLOOD  Result Value Ref Range Status   Specimen Description   Final    BLOOD RIGHT ARM Performed at [facility], [address] [place], [place] [address]    Special Requests   Final    BOTTLES DRAWN AEROBIC AND ANAEROBIC Blood Culture adequate volume Performed at [facility], [address] [place], [place] [address]    Culture  Setup Time   Final    GRAM NEGATIVE RODS AEROBIC BOTTLE ONLY CRITICAL RESULT CALLED TO, READ BACK BY AND VERIFIED WITH:  NATHAN BELUE AT 0541 01/27/24 JG Performed at [facility], [address] [place], [place] [address]    Culture PSEUDOMONAS AERUGINOSA (A)  Final   Report Status 01/29/2024 FINAL  Final   Organism ID, Bacteria PSEUDOMONAS AERUGINOSA  Final      Susceptibility   Pseudomonas aeruginosa - MIC*    MEROPENEM INTERMEDIATE  Intermediate     CIPROFLOXACIN 0.5 SENSITIVE Sensitive     IMIPENEM >=16 RESISTANT Resistant     PIP/TAZO Value in next row Sensitive      8 SENSITIVEThis is a modified FDA-approved test that has been validated and its performance characteristics determined by the reporting laboratory.  This laboratory is certified under the Clinical Laboratory Improvement Amendments CLIA as qualified to perform high complexity clinical laboratory testing.    CEFEPIME Value in next row Sensitive      8 SENSITIVEThis is a modified FDA-approved test that has been validated and its performance characteristics determined by the reporting laboratory.  This laboratory is certified under the Clinical Laboratory Improvement Amendments CLIA as qualified to perform high complexity clinical laboratory testing.    CEFTAZIDIME/AVIBACTAM Value in next row Sensitive      8 SENSITIVEThis is a modified FDA-approved test that has been validated and its performance characteristics determined by the reporting laboratory.  This laboratory is certified under the Clinical Laboratory Improvement Amendments CLIA as qualified to perform high complexity clinical laboratory testing.    CEFTOLOZANE/TAZOBACTAM Value in next row Sensitive      8 SENSITIVEThis is a modified FDA-approved test that has been validated and its performance characteristics determined by the reporting laboratory.  This laboratory is certified under the Clinical Laboratory Improvement Amendments CLIA as qualified to perform high complexity clinical laboratory testing.    TOBRAMYCIN Value in next row Sensitive      8 SENSITIVEThis is a modified FDA-approved test that has been validated and its performance characteristics determined by the reporting laboratory.  This laboratory is certified under the Clinical Laboratory Improvement Amendments CLIA as qualified to perform high complexity clinical laboratory testing.    CEFTAZIDIME Value in next row Sensitive      8  SENSITIVEThis is a modified FDA-approved test that has been validated and its performance characteristics determined by the reporting laboratory.  This laboratory is certified under the Clinical Laboratory Improvement Amendments CLIA as qualified to perform high complexity clinical laboratory testing.    * PSEUDOMONAS AERUGINOSA  Blood Culture ID Panel (Reflexed)     Status: Abnormal   Collection Time: 01/26/24  1:01 PM  Result Value Ref Range Status   Enterococcus faecalis NOT DETECTED NOT DETECTED Final   Enterococcus Faecium NOT DETECTED NOT DETECTED Final   Listeria monocytogenes NOT DETECTED NOT DETECTED Final   Staphylococcus species NOT DETECTED NOT DETECTED Final   Staphylococcus aureus (BCID) NOT DETECTED NOT DETECTED Final   Staphylococcus epidermidis NOT DETECTED NOT DETECTED Final   Staphylococcus  lugdunensis NOT DETECTED NOT DETECTED Final   Streptococcus species NOT DETECTED NOT DETECTED Final   Streptococcus agalactiae NOT DETECTED NOT DETECTED Final   Streptococcus pneumoniae NOT DETECTED NOT DETECTED Final   Streptococcus pyogenes NOT DETECTED NOT DETECTED Final   A.calcoaceticus-baumannii NOT DETECTED NOT DETECTED Final   Bacteroides fragilis NOT DETECTED NOT DETECTED Final   Enterobacterales NOT DETECTED NOT DETECTED Final   Enterobacter cloacae complex NOT DETECTED NOT DETECTED Final   Escherichia coli NOT DETECTED NOT DETECTED Final   Klebsiella aerogenes NOT DETECTED NOT DETECTED Final   Klebsiella oxytoca NOT DETECTED NOT DETECTED Final   Klebsiella pneumoniae NOT DETECTED NOT DETECTED Final   Proteus species NOT DETECTED NOT DETECTED Final   Salmonella species NOT DETECTED NOT DETECTED Final   Serratia marcescens NOT DETECTED NOT DETECTED Final   Haemophilus influenzae NOT DETECTED NOT DETECTED Final   Neisseria meningitidis NOT DETECTED NOT DETECTED Final   Pseudomonas aeruginosa DETECTED (A) NOT DETECTED Final    Comment: CRITICAL RESULT CALLED TO, READ BACK BY  AND VERIFIED WITH:  NATHAN BELUE AT 0541 01/27/24 JG    Stenotrophomonas maltophilia NOT DETECTED NOT DETECTED Final   Candida albicans NOT DETECTED NOT DETECTED Final   Candida auris NOT DETECTED NOT DETECTED Final   Candida glabrata NOT DETECTED NOT DETECTED Final   Candida krusei NOT DETECTED NOT DETECTED Final   Candida parapsilosis NOT DETECTED NOT DETECTED Final   Candida tropicalis NOT DETECTED NOT DETECTED Final   Cryptococcus neoformans/gattii NOT DETECTED NOT DETECTED Final   CTX-M ESBL NOT DETECTED NOT DETECTED Final   Carbapenem resistance IMP NOT DETECTED NOT DETECTED Final   Carbapenem resistance KPC NOT DETECTED NOT DETECTED Final   Carbapenem resistance NDM NOT DETECTED NOT DETECTED Final   Carbapenem resistance VIM NOT DETECTED NOT DETECTED Final    Comment: Performed at [facility], [address] [place], [place] [address]  Culture, blood (Routine X 2) w Reflex to ID Panel     Status: None (Preliminary result)   Collection Time: 01/29/24  4:38 AM   Specimen: BLOOD  Result Value Ref Range Status   Specimen Description BLOOD BLOOD LEFT ARM  Final   Special Requests   Final    BOTTLES DRAWN AEROBIC AND ANAEROBIC Blood Culture adequate volume   Culture   Final    NO GROWTH <12 HOURS Performed at [facility], [address] [place], [place] [address]    Report Status PENDING  Incomplete  Culture, blood (Routine X 2) w Reflex to ID Panel     Status: None (Preliminary result)   Collection Time: 01/29/24  4:43 AM   Specimen: BLOOD  Result Value Ref Range Status   Specimen Description BLOOD BLOOD LEFT HAND  Final   Special Requests   Final    BOTTLES DRAWN AEROBIC AND ANAEROBIC Blood Culture adequate volume   Culture   Final    NO GROWTH <12 HOURS Performed at [facility], [address] [place], [place] [address]    Report Status PENDING  Incomplete    Coagulation Studies: Recent Labs    01/26/24 1256  LABPROT 13.8  INR  1.0    Urinalysis: Recent Labs    01/26/24 1430  COLORURINE STRAW*  LABSPEC 1.009  PHURINE 5.0  GLUCOSEU 150*  HGBUR NEGATIVE  BILIRUBINUR NEGATIVE  KETONESUR NEGATIVE  PROTEINUR 100*  NITRITE NEGATIVE  LEUKOCYTESUR NEGATIVE      Imaging: DG Chest Port 1 View Result Date: 01/28/2024 CLINICAL  DATA:  Shortness of breath EXAM: PORTABLE CHEST 1 VIEW COMPARISON:  Chest x-ray 01/26/2024 FINDINGS: Heart is enlarged. There central pulmonary vascular congestion. There some minimal patchy opacities in the right mid lung. The costophrenic angles are clear. No pneumothorax or acute fracture. There are degenerative changes of the left shoulder. IMPRESSION: 1. Cardiomegaly with central pulmonary vascular congestion. 2. Minimal patchy opacities in the right mid lung may represent atelectasis or infection. Electronically Signed   By: Greig Pique M.D.   On: 01/28/2024 17:49   ECHOCARDIOGRAM COMPLETE Result Date: 01/28/2024    ECHOCARDIOGRAM REPORT   Patient Name:   CANNIE MUCKLE Date of Exam: 01/28/2024 Medical Rec #:  [id number]         Height:       64.0 in Accession #:    [id number]        Weight:       308.6 lb Date of Birth:  1940-08-08         BSA:          2.353 m Patient Age:    83 years          BP:           117/59 mmHg Patient Gender: F                 HR:           89 bpm. Exam Location:  [facility] Procedure: 2D Echo, Cardiac Doppler and Color Doppler (Both Spectral and Color            Flow Doppler were utilized during procedure). Indications:     Abnormal ECG R94.31  History:         Patient has no prior history of Echocardiogram examinations.                  COPD; Risk Factors:Hypertension and Diabetes.  Sonographer:     Christopher Furnace Referring Phys:  [id number] [facility] Diagnosing Phys: Marsa Dooms MD  Sonographer Comments: Technically challenging study due to limited acoustic windows, patient is obese and no apical window. IMPRESSIONS  1. Left ventricular ejection fraction, by  estimation, is 60 to 65%. The left ventricle has normal function. The left ventricle has no regional wall motion abnormalities. Left ventricular diastolic parameters are indeterminate.  2. Right ventricular systolic function is normal. The right ventricular size is normal.  3. The mitral valve is normal in structure. Mild mitral valve regurgitation. No evidence of mitral stenosis.  4. The aortic valve is normal in structure. Aortic valve regurgitation is not visualized. No aortic stenosis is present.  5. The inferior vena cava is normal in size with greater than 50% respiratory variability, suggesting right atrial pressure of 3 mmHg. FINDINGS  Left Ventricle: Left ventricular ejection fraction, by estimation, is 60 to 65%. The left ventricle has normal function. The left ventricle has no regional wall motion abnormalities. Strain was performed and the global longitudinal strain is indeterminate. The left ventricular internal cavity size was normal in size. There is no left ventricular hypertrophy. Left ventricular diastolic parameters are indeterminate. Right Ventricle: The right ventricular size is normal. No increase in right ventricular wall thickness. Right ventricular systolic function is normal. Left Atrium: Left atrial size was normal in size. Right Atrium: Right atrial size was normal in size. Pericardium: There is no evidence of pericardial effusion. Mitral Valve: The mitral valve is normal in structure. Mild mitral valve regurgitation. No evidence of mitral valve stenosis. Tricuspid Valve:  The tricuspid valve is normal in structure. Tricuspid valve regurgitation is mild . No evidence of tricuspid stenosis. Aortic Valve: The aortic valve is normal in structure. Aortic valve regurgitation is not visualized. No aortic stenosis is present. Pulmonic Valve: The pulmonic valve was normal in structure. Pulmonic valve regurgitation is not visualized. No evidence of pulmonic stenosis. Aorta: The aortic root is  normal in size and structure. Venous: The inferior vena cava is normal in size with greater than 50% respiratory variability, suggesting right atrial pressure of 3 mmHg. IAS/Shunts: No atrial level shunt detected by color flow Doppler. Additional Comments: 3D was performed not requiring image post processing on an independent workstation and was indeterminate.  LEFT VENTRICLE PLAX 2D LVIDd:         4.80 cm LVIDs:         2.80 cm LV PW:         1.00 cm LV IVS:        1.10 cm LVOT diam:     2.00 cm LVOT Area:     3.14 cm  LEFT ATRIUM         Index LA diam:    2.90 cm 1.23 cm/m   AORTA Ao Root diam: 2.70 cm  SHUNTS Systemic Diam: 2.00 cm Marsa Dooms MD Electronically signed by Marsa Dooms MD Signature Date/Time: 01/28/2024/1:33:28 PM    Final      Medications:    amiodarone 30 mg/hr (01/29/24 1000)   cefTAZidime (FORTAZ)  IV Stopped (01/29/24 0957)    amiodarone  400 mg Oral BID   Followed by   [name] ON 02/05/2024] amiodarone  200 mg Oral Daily   apixaban  2.5 mg Oral BID   budesonide-glycopyrrolate-formoterol  2 puff Inhalation BID   furosemide   60 mg Intravenous BID   midodrine  5 mg Oral TID WC   pantoprazole   40 mg Oral Daily   pravastatin  20 mg Oral QPM   sodium chloride  flush  3 mL Intravenous Q12H   acetaminophen  **OR** acetaminophen , morphine injection, senna-docusate  Assessment/ Plan:  Ms. CADYN RODGER is a 83 y.o.  female with  past medical history including hyperlipidemia, type 2 diabetes, hypertension, obesity, and COPD, who was admitted to [facility] on 01/26/2024 for SVT (supraventricular tachycardia) [I47.10] Cellulitis of left lower extremity [L03.116] Sepsis due to cellulitis (HCC) [L03.90, A41.9] Sepsis, due to unspecified organism, unspecified whether acute organ dysfunction present (HCC) [A41.9]   Acute kidney injury with mild proteinuria on chronic kidney disease stage IV. Baseline creatinine appears to be 1.83 with GFR 27 on December 08, 2023.  Acute kidney workup in progress. Suspect there may be a cardiorenal component. BNP elevated. Awaiting renal US . Renal function continues to worsen. Urine output acceptable after IV Furosamide. Now scheduled twice a day. No acute indication for dialysis. Continue supportive measures.  Lab Results  Component Value Date   CREATININE 2.86 (H) 01/29/2024   CREATININE 2.78 (H) 01/28/2024   CREATININE 2.70 (H) 01/27/2024    Intake/Output Summary (Last 24 hours) at 01/29/2024 1237 Last data filed at 01/29/2024 1000 Gross per 24 hour  Intake 2252.94 ml  Output 2450 ml  Net -197.06 ml   2. Anemia of chronic kidney disease Lab Results  Component Value Date   HGB 8.2 (L) 01/29/2024    Hgb decreased. Will continue to monitor for now.   3. Mild hyperkalemia  Potassium 4.7 today. Can continue management with Lokelma as needed.    4.  Hypotension with history of hypertension.  Likely secondary to infectious process.  All antihypertensives held at this time.  Midodrine ordered per primary team. Blood pressure soft but stable   LOS: 3 Ramadan Couey 11/14/202512:37 PM

## 2024-01-29 NOTE — Plan of Care (Addendum)
 2115 - Attempted to take patient off BIPAP to give break and needed PO meds.  The  few needed meds were accomplished but were a challenge, since patient was lethargic and not fully oriented.  Also, even though patient was placed on 4LNC, SPO2 did not maintain and quickly dropped (upper 70s)  Bipap re-applied and Resp informed.  SPO2 back to low -mid 90s.

## 2024-01-30 DIAGNOSIS — R0689 Other abnormalities of breathing: Secondary | ICD-10-CM | POA: Diagnosis not present

## 2024-01-30 DIAGNOSIS — Z6841 Body Mass Index (BMI) 40.0 and over, adult: Secondary | ICD-10-CM

## 2024-01-30 DIAGNOSIS — R7881 Bacteremia: Secondary | ICD-10-CM

## 2024-01-30 DIAGNOSIS — I890 Lymphedema, not elsewhere classified: Secondary | ICD-10-CM

## 2024-01-30 DIAGNOSIS — G9341 Metabolic encephalopathy: Secondary | ICD-10-CM | POA: Diagnosis not present

## 2024-01-30 DIAGNOSIS — B965 Pseudomonas (aeruginosa) (mallei) (pseudomallei) as the cause of diseases classified elsewhere: Secondary | ICD-10-CM

## 2024-01-30 LAB — BASIC METABOLIC PANEL WITH GFR
Anion gap: 14 (ref 5–15)
BUN: 64 mg/dL — ABNORMAL HIGH (ref 8–23)
CO2: 29 mmol/L (ref 22–32)
Calcium: 10.1 mg/dL (ref 8.9–10.3)
Chloride: 99 mmol/L (ref 98–111)
Creatinine, Ser: 2.79 mg/dL — ABNORMAL HIGH (ref 0.44–1.00)
GFR, Estimated: 16 mL/min — ABNORMAL LOW (ref >60)
Glucose, Bld: 65 mg/dL — ABNORMAL LOW (ref 70–99)
Potassium: 4.4 mmol/L (ref 3.5–5.1)
Sodium: 142 mmol/L (ref 135–145)

## 2024-01-30 LAB — CBC
HCT: 28.7 % — ABNORMAL LOW (ref 36.0–46.0)
Hemoglobin: 9 g/dL — ABNORMAL LOW (ref 12.0–15.0)
MCH: 28.7 pg (ref 26.0–34.0)
MCHC: 31.4 g/dL (ref 30.0–36.0)
MCV: 91.4 fL (ref 80.0–100.0)
Platelets: 256 K/uL (ref 150–400)
RBC: 3.14 MIL/uL — ABNORMAL LOW (ref 3.87–5.11)
RDW: 13.8 % (ref 11.5–15.5)
WBC: 11.3 K/uL — ABNORMAL HIGH (ref 4.0–10.5)
nRBC: 0 % (ref 0.0–0.2)

## 2024-01-30 LAB — GLUCOSE, CAPILLARY
Glucose-Capillary: 71 mg/dL (ref 70–99)
Glucose-Capillary: 78 mg/dL (ref 70–99)
Glucose-Capillary: 79 mg/dL (ref 70–99)
Glucose-Capillary: 88 mg/dL (ref 70–99)

## 2024-01-30 MED ORDER — POLYETHYLENE GLYCOL 3350 17 G PO PACK
17.0000 g | PACK | Freq: Every day | ORAL | Status: DC
  Administered 2024-01-30 – 2024-02-06 (×8): 17 g via ORAL
  Filled 2024-01-30 (×8): qty 1

## 2024-01-30 MED ORDER — BISACODYL 10 MG RE SUPP: 10.0000 mg | Freq: Every day | RECTAL | Status: DC | PRN

## 2024-01-30 MED ORDER — SENNOSIDES-DOCUSATE SODIUM 8.6-50 MG PO TABS
2.0000 | ORAL_TABLET | Freq: Every evening | ORAL | Status: DC | PRN
  Administered 2024-02-02: 2 via ORAL
  Filled 2024-01-30: qty 2

## 2024-01-30 MED ORDER — DEXTROSE 50 % IV SOLN: 1.0000 | INTRAVENOUS | Status: DC | PRN

## 2024-01-30 MED ORDER — BOOST PLUS PO LIQD
237.0000 mL | Freq: Three times a day (TID) | ORAL | Status: DC
  Administered 2024-01-31 – 2024-02-08 (×17): 237 mL via ORAL

## 2024-01-30 NOTE — TOC Initial Note (Signed)
 Transition of Care [facility]) - Initial/Assessment Note    Patient Details  Name: Kelly Hogan MRN: [id number] Date of Birth: [birth year]  Transition [facility] Care [facility]) CM/SW Contact:    Victory Jackquline RAMAN, RN Phone Number: 01/30/2024, 3:50 PM  Clinical Narrative:     RNCM met with patient at bedside. RNCM introduced role and explained that discharge planning would be discussed. PT is recommending STR. Patient is in agreement but wants me to speak to her son. I spoke to the patients son Darold @ [phone].   RNCM introduced role and explained that discharge planning would be discussed. He's also in agreement with STR and his first choice is [facility]. FL2 completed and referrals for bed offers submitted via EPIC. 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  RNCM will continue to follow for discharge planning needs.     Expected Discharge Plan: Skilled Nursing Facility Barriers to Discharge: Continued Medical Work up   Patient Goals and CMS Choice            Expected Discharge Plan and Services       Living arrangements for the past 2 months: Single Family Home                                      Prior Living Arrangements/Services Living arrangements for the past 2 months: Single Family Home Lives with:: Self, Adult Children Patient language and need for interpreter reviewed:: Yes Do you feel safe going back to the place where you live?: Yes      Need for Family Participation in Patient Care: Yes (Comment) Care giver support system in place?: Yes (comment) Current home services: DME Criminal Activity/Legal Involvement Pertinent to Current Situation/Hospitalization: No - Comment as needed  Activities of Daily Living   ADL Screening (condition at time of admission) Independently performs ADLs?: No Does the patient have a NEW difficulty with bathing/dressing/toileting/self-feeding that is expected to last >3  days?: Yes (Initiates electronic notice to provider for possible OT consult) Does the patient have a NEW difficulty with getting in/out of bed, walking, or climbing stairs that is expected to last >3 days?: Yes (Initiates electronic notice to provider for possible PT consult) Does the patient have a NEW difficulty with communication that is expected to last >3 days?: Yes (Initiates electronic notice to provider for possible SLP consult) Is the patient deaf or have difficulty hearing?: No Does the patient have difficulty seeing, even when wearing glasses/contacts?: No Does the patient have difficulty concentrating, remembering, or making decisions?: No  Permission Sought/Granted                  Emotional Assessment Appearance:: Appears stated age Attitude/Demeanor/Rapport: Gracious, Engaged Affect (typically observed): Calm, Accepting, Quiet, Pleasant Orientation: : Oriented to Self, Oriented to Place, Oriented to  Time, Oriented to Situation Alcohol / Substance Use: Not Applicable Psych Involvement: No (comment)  Admission diagnosis:  SVT (supraventricular tachycardia) [I47.10] Cellulitis of left lower extremity [L03.116] Sepsis due to cellulitis (HCC) [L03.90, A41.9] Sepsis, due to unspecified organism, unspecified whether acute organ dysfunction present [facility]) [A41.9] Patient Active Problem List   Diagnosis Date Noted   Bacteremia due to Pseudomonas 01/29/2024   CO2 narcosis 01/29/2024   Metabolic encephalopathy 01/29/2024   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   Sepsis due to cellulitis (HCC) 01/26/2024   Dysuria 12/17/2022   Swelling  of lower extremity 07/17/2022   Acute on chronic diastolic congestive heart failure (HCC) 02/09/2020   (HFpEF) heart failure with preserved ejection fraction (HCC) 12/21/2019   Hypervolemia 10/18/2019   AKI (acute kidney injury) 10/14/2019   Pulmonary edema 10/14/2019   OSA (obstructive sleep apnea) 07/17/2019   Obesity hypoventilation  syndrome (HCC) 07/17/2019   Acute on chronic respiratory failure with hypoxia (HCC) 07/13/2019   Morbid obesity with BMI of 50.0-59.9, adult (HCC) 07/13/2019   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/2019   HLD (hyperlipidemia) 07/07/2019   CKD (chronic kidney disease), stage IIIa 07/07/2019   Tobacco abuse 07/07/2019   Generalized weakness 07/07/2019   Hyponatremia 07/07/2019   Hyperkalemia 07/07/2019   Normocytic anemia 07/07/2019   Cellulitis and abscess of toe 03/17/2019   Morbid obesity (HCC) 10/17/2015   Acute low back pain 05/22/2015   Gastric ulcer 09/26/2014   Osteopenia 09/07/2013   H/O adenomatous polyp of colon 03/20/2013   COPD (chronic obstructive pulmonary disease) (HCC) 07/05/2012   Acid reflux 05/04/2012   Chronic respiratory failure (HCC) 11/25/2010   Chronic venous insufficiency 01/29/2010   Type 2 diabetes mellitus (HCC) 11/07/2008   Airway hyperreactivity 11/30/1996   Essential (primary) hypertension 11/30/1996   PCP:  [name] [name] LABOR, MD Pharmacy:   [facility] [address]), [place] - [address] [place]) [place] [address] Phone: [phone] Fax: [phone]  [facility] [address] [place] [address] Phone: [phone] Fax: [phone]     Social Drivers of Health (SDOH) Social History: SDOH Screenings   Food Insecurity: Patient Declined (01/27/2024)  Housing: Patient Declined (01/27/2024)  Transportation Needs: Patient Declined (01/27/2024)  Utilities: Patient Declined (01/27/2024)  Financial Resource Strain: Low Risk (08/12/2023)   Received from [facility]  Social Connections: Patient Declined (01/27/2024)  Stress: No Stress Concern Present (11/17/2023)   [facility] from [facility]  Tobacco Use: Medium Risk (12/21/2023)   Received from [facility] Literacy: Low Risk (07/06/2023)   Received from  [facility]   SDOH Interventions:     Readmission Risk Interventions     No data to display

## 2024-01-30 NOTE — Progress Notes (Addendum)
 PROGRESS NOTE    Kelly Hogan   FMW:[id number] DOB: 09-03-1940  DOA: 01/26/2024 Date of Service: 01/30/24 which is hospital day 4  PCP: [name] [name] LABOR, MD    Hospital course / significant events:   HPI: Kelly Hogan is a 83 y.o. 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    11/11: admitted to hospitalist w/ hypotension, cellulitis. Metronidazole and cefepime in the ED. SVT with rates of 140s, pt requiring amio gtt.   11/12: (+)Pseudomonas bacteremia - linezolid and vancomycin. Remains on amio gtt for now, cardiology will see in AM. Was off amio briefly this evening d/t limited IV access and needed other Rx, HR was WNL but then up again and back on the drip. Renal fxn worse, resp acidosis and hypercarbia, pt placed on BiPAP. Fluids changed to bicarb infusion 1L. Hyperkalemia, mild, gave K shifting Rx w/ insulin/D50, also admin Lokelma.  11/13: renal fxn continuing to worsen - Nephrology consulted. Pt remains on BiPAP this morning. Was off briefly per her request but O2 down and SOB so placed back on the BiPAP. BP soft, gave another 1L fluids. Cardiology - transition to po amio, starting eliquis. ID recs change ceftazidime d/t myoclonus effects and confusion. Still worsening into the afternoon, CXR and BNP indicative of HFpEF w/ pulmonary edema despite fairly clear lungs (but exam difficult). Son is ok for trial of diuretics despite renal fxn.  11/14: good UOP and respiratory is improved onto [place] O2, renal fxn about same. Continuing on ceftazidime. Changed morphine to dilaudid w/ renal fxn. Renal US  no concerns. Cr 2.86. BCx repeated. Requiring BiPAP at night  11/15: somnolent but alerts to voice. Has not wanted to sit up in bed/chair. Cr  7.9. UOP yesterday looks like under documented. Continue diuresis. Repeat BCx NG thus far.      Consultants:  Cardiology  Infectious disease   Procedures/Surgeries: none      ASSESSMENT & PLAN:   Cellulitis of the left lower extremity. Present on admission and ruled out DVT (though US  exam somewhat limited) (+)Pseudomonas bacteremia Dc cefepime d/t myoclonus / neuro effects. Continue on ceftazidime.  ID following  Monitor cultures --> initial cultures 11/11 sensitive to ceftazidime, repeat cultures 11/14 are negative thus far  trend leukocyte count and fever curve.   ID consult for bacteremia - likely cellulitis but eval lungs as well may need CT    HFpEF Exacerbation d/t SVT + fluid tx per sepsis protocol  Echo noted preserved EF but unable to assess diastolic fxn, likely is dysfunctional. No significant valve dysfunction.  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   Midodrine dc today hold antihypertensives. Cardiology following    Type 2 diabetes Monitor glc Relatively low glc, will hold on SSI for now   Hyperlipidemia:  statin   Chronic venous stasis Rec follow w/ lymphedema clinic   Skin nodularity bilateral lower  extremities Question hyperkeratotic lesions? Keloid type lesions?  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  Underweight - under 18  overweight - 25 to 29 obese - 30 or more Class 1 obesity: BMI of 30.0 to 34 Class 2 obesity: BMI of 35.0 to 39 Class 3 obesity: BMI of 40.0 to 49 Super Morbid Obesity: BMI 50-59 Super-super Morbid Obesity: BMI 60+ Healthy nutrition and physical activity advised as adjunct to other disease management and risk reduction treatments    DVT prophylaxis: lovenox  IV fluids:dc Nutrition: dysphagia diet for now  Unumprovident / other devices: none  Code Status: DNR per MOST form which was completed by the patient  ACP documentation reviewed: none on file in [facility]  [facility] needs: SNF  Medical barriers to dispo: IV abx and bacteremia. Expected medical readiness for discharge several days at least.              Subjective / Brief ROS:  Patient reports dry mouth Awakens to voice, minimal responses  Denies CP Reports feels about same as yesterday   Family Communication:call to son darold 01/30/24 4:33 PM updates given all questions answered        Objective Findings:  Vitals:   01/29/24 2122 01/29/24 2124 01/30/24 0500 01/30/24 0800  BP:    (!) 152/71  Pulse:    80  Resp:    20  Temp:    97.7 F (36.5 C)  TempSrc:    Axillary  SpO2: (!) 77% 92%  97%  Weight:   (!) 142 kg   Height:        Intake/Output Summary (Last 24 hours) at 01/30/2024 1418 Last data filed at 01/30/2024 1040 Gross per 24 hour  Intake 7.28 ml  Output 850 ml  Net -842.72 ml   Filed Weights   01/28/24 0500 01/29/24  0357 01/30/24 0500  Weight: (!) 140 kg (!) 140 kg (!) 142 kg    Examination:  Physical Exam Constitutional:      General: She is not in acute distress.    Appearance: She is ill-appearing.  Cardiovascular:     Rate and Rhythm: Normal rate and regular rhythm.  Pulmonary:     Effort: Tachypnea present. No respiratory distress.     Breath sounds: Decreased air movement present.  Musculoskeletal:     Right lower leg: Edema present.     Left lower leg: Edema present.  Skin:    Comments: See photos  Neurological:     Mental Status: She is alert and oriented to person, place, and time. Mental status is at baseline.  Psychiatric:        Mood and Affect: Mood normal.        Behavior: Behavior normal.       01/27/24 - erythema improved  01/26/24     Scheduled Medications:   amiodarone  400 mg Oral BID   Followed by   [name] ON 02/05/2024] amiodarone  200 mg Oral Daily   apixaban  2.5 mg Oral BID   budesonide-glycopyrrolate-formoterol  2 puff Inhalation BID   furosemide   60 mg Intravenous BID   pantoprazole   40 mg Oral Daily   polyethylene glycol  17 g Oral Daily   pravastatin  20 mg Oral QPM   sodium chloride  flush  3 mL Intravenous Q12H    Continuous Infusions:  cefTAZidime (FORTAZ)  IV 2 g (01/30/24 1128)    PRN Medications:  acetaminophen  **OR** acetaminophen , bisacodyl , dextrose, HYDROmorphone (DILAUDID) injection, senna-docusate  Antimicrobials from admission:  Anti-infectives (From admission, onward)    Start     Dose/Rate Route Frequency Ordered Stop   01/29/24 1000  cefTAZidime (FORTAZ) 2 g in sodium chloride  0.9 % 100 mL IVPB        2 g 200 mL/hr over 30 Minutes Intravenous Every 24 hours 01/28/24 1651     01/28/24 1600  vancomycin (VANCOCIN) IVPB 1000 mg/200 mL premix  Status:  Discontinued       Placed in Followed by Linked Group   1,000 mg 200 mL/hr over 60 Minutes Intravenous Every 36 hours 01/27/24 0241 01/27/24 1045   01/27/24 2200   linezolid (ZYVOX) IVPB 600 mg  Status:  Discontinued        600 mg 300 mL/hr over 60 Minutes Intravenous Every 12 hours 01/27/24 1046 01/28/24 1618   01/27/24 1200  ceFEPIme (MAXIPIME) 2 g in sodium chloride  0.9 % 100 mL IVPB  Status:  Discontinued        2 g 200 mL/hr over 30 Minutes Intravenous Every 24 hours 01/27/24 0228 01/28/24 1651   01/27/24 0400  vancomycin (VANCOREADY) IVPB 2000 mg/400 mL       Placed in Followed by Linked Group   2,000 mg 200 mL/hr over 120 Minutes Intravenous  Once 01/27/24 0241 01/27/24 0713   01/27/24 0100  ceFAZolin (ANCEF) IVPB 2g/100 mL premix  Status:  Discontinued        2 g 200 mL/hr over 30 Minutes Intravenous Every 8 hours 01/27/24 0037 01/27/24 0042   01/26/24 1300  ceFEPIme (MAXIPIME) 2 g in sodium chloride  0.9 % 100 mL IVPB        2 g 200 mL/hr over 30 Minutes Intravenous  Once 01/26/24 1256 01/26/24 1350   01/26/24 1300  metroNIDAZOLE (FLAGYL) IVPB 500 mg        500 mg 100 mL/hr over 60 Minutes Intravenous  Once 01/26/24 1256 01/26/24 1559   01/26/24 1300  vancomycin (VANCOCIN) IVPB 1000 mg/200 mL premix        1,000 mg 200 mL/hr over 60 Minutes Intravenous  Once 01/26/24 1256 01/26/24 1458           Data Reviewed:  I have personally reviewed the following...  CBC: Recent Labs  Lab 01/26/24 1256 01/27/24 0456 01/28/24 0409 01/29/24 0438 01/30/24 0411  WBC 8.5 24.0* 21.6* 16.8* 11.3*  NEUTROABS 7.9*  --   --   --   --   HGB 10.6* 8.7* 8.1* 8.2* 9.0*  HCT 35.0* 29.4* 27.3* 28.0* 28.7*  MCV 93.1 96.4 96.5 95.2 91.4  PLT 325 249 232 248 256   Basic Metabolic Panel: Recent Labs  Lab 01/26/24 1400 01/27/24 0456 01/27/24 1706 01/28/24 0409 01/29/24 0438 01/30/24 0411  NA 139 138 137 136 139 142  K 4.7 5.6*  5.4* 4.9 4.7 4.4  CL 102 100 99 99 99 99  CO2 24 27 26 27 27 29   GLUCOSE 133* 102* 80 92 85 65*  BUN 54* 58* 60* 63* 65* 64*  CREATININE 2.03* 2.59* 2.70* 2.78* 2.86* 2.79*  CALCIUM  9.1 9.2 9.3 8.9 9.7 10.1  MG  2.2  --   --   --   --   --    GFR: Estimated Creatinine Clearance: 22 mL/min (A) (by C-G formula based on SCr of 2.79 mg/dL (H)). Liver Function Tests: Recent Labs  Lab 01/26/24 1400  AST 16  ALT 8  ALKPHOS 45  BILITOT 0.4  PROT 6.8  ALBUMIN 3.8   No results for input(s): LIPASE, AMYLASE in the last 168 hours. No results for input(s): AMMONIA in the last 168 hours. Coagulation Profile: Recent Labs  Lab 01/26/24 1256  INR 1.0   Cardiac Enzymes: No results for input(s): CKTOTAL, CKMB, CKMBINDEX, TROPONINI in the last 168 hours. BNP (last 3 results) Recent Labs    01/26/24 1256 01/28/24 0409  PROBNP 121.0 9,777.0*   HbA1C: No results for input(s): HGBA1C in the last 72 hours. CBG: Recent Labs  Lab 01/29/24 1147 01/29/24 1619 01/29/24 2055 01/30/24 0816 01/30/24 1154  GLUCAP 84 78 82 71 78   Lipid Profile: No results for input(s): CHOL, HDL, LDLCALC, TRIG, CHOLHDL, LDLDIRECT in the last 72 hours. Thyroid Function Tests: No results for input(s): TSH, T4TOTAL, FREET4, T3FREE, THYROIDAB in the last 72 hours.  Anemia Panel: No results for input(s): VITAMINB12, FOLATE, FERRITIN, TIBC, IRON, RETICCTPCT in the last 72 hours. Most Recent Urinalysis On File:     Component Value Date/Time   COLORURINE STRAW (A) 01/26/2024 1430   APPEARANCEUR CLEAR (A) 01/26/2024 1430   APPEARANCEUR Cloudy 10/14/2012 2100   LABSPEC 1.009 01/26/2024 1430   LABSPEC 1.016 10/14/2012 2100   PHURINE 5.0 01/26/2024 1430   GLUCOSEU 150 (A) 01/26/2024 1430   GLUCOSEU Negative 10/14/2012 2100   HGBUR NEGATIVE 01/26/2024 1430   BILIRUBINUR NEGATIVE 01/26/2024 1430   BILIRUBINUR Negative 10/14/2012 2100   KETONESUR NEGATIVE 01/26/2024 1430   PROTEINUR 100 (A) 01/26/2024 1430   NITRITE NEGATIVE 01/26/2024 1430   LEUKOCYTESUR NEGATIVE 01/26/2024 1430   LEUKOCYTESUR Negative 10/14/2012 2100   Sepsis  Labs: @LABRCNTIP (procalcitonin:4,lacticidven:4) Microbiology: Recent Results (from the past 240 hours)  Resp panel by RT-PCR (RSV, Flu A&B, Covid) Anterior Nasal Swab     Status: None   Collection Time: 01/26/24 12:56 PM   Specimen: Anterior Nasal Swab  Result Value Ref Range Status   SARS Coronavirus 2 by RT PCR NEGATIVE NEGATIVE Final    Comment: (NOTE) SARS-CoV-2 target nucleic acids are NOT DETECTED.  The SARS-CoV-2 RNA is generally detectable in upper respiratory specimens during the acute phase of infection. The lowest concentration of SARS-CoV-2 viral copies this assay can detect is 138 copies/mL. A negative result does not preclude SARS-Cov-2 infection and should not be used as the sole basis for treatment or other patient management decisions. A negative result may occur with  improper specimen collection/handling, submission of specimen other than nasopharyngeal swab, presence of viral mutation(s) within the areas targeted by this assay, and inadequate number of viral copies(<138 copies/mL). A negative result must be combined with clinical observations, patient history, and epidemiological information. The expected result is Negative.  Fact Sheet for Patients:  bloggercourse.com  Fact Sheet for Healthcare Providers:  seriousbroker.it  This test is no t yet approved or cleared by the [place]  FDA and  has been authorized for detection and/or diagnosis of SARS-CoV-2 by FDA under an Emergency Use Authorization (EUA). This EUA will remain  in effect (meaning this test can be used) for the duration of the COVID-19 declaration under Section 564(b)(1) of the Act, 21 U.S.C.section 360bbb-3(b)(1), unless the authorization is terminated  or revoked sooner.       Influenza A by PCR NEGATIVE NEGATIVE Final   Influenza B by PCR NEGATIVE NEGATIVE Final    Comment: (NOTE) The Xpert Xpress SARS-CoV-2/FLU/RSV plus assay is  intended as an aid in the diagnosis of influenza from Nasopharyngeal swab specimens and should not be used as a sole basis for treatment. Nasal washings and aspirates are unacceptable for Xpert Xpress SARS-CoV-2/FLU/RSV testing.  Fact Sheet for Patients: bloggercourse.com  Fact Sheet for Healthcare Providers: seriousbroker.it  This test is not yet approved or cleared by the [place]  FDA and has been authorized for detection and/or diagnosis of SARS-CoV-2 by FDA under an Emergency Use Authorization (EUA). This EUA will remain in effect (meaning this test can be used) for the duration of the COVID-19 declaration under Section 564(b)(1) of the Act, 21 U.S.C. section 360bbb-3(b)(1), unless the authorization is terminated or revoked.     Resp Syncytial Virus by PCR NEGATIVE NEGATIVE Final    Comment: (NOTE) Fact Sheet for Patients: bloggercourse.com  Fact Sheet for Healthcare Providers: seriousbroker.it  This test is not yet approved or cleared by the [place]  FDA and has been authorized for detection and/or diagnosis of SARS-CoV-2 by FDA under an Emergency Use Authorization (EUA). This EUA will remain in effect (meaning this test can be used) for the duration of the COVID-19 declaration under Section 564(b)(1) of the Act, 21 U.S.C. section 360bbb-3(b)(1), unless the authorization is terminated or revoked.  Performed at [facility], [address] [place], [place] [address]   Blood Culture (routine x 2)     Status: Abnormal   Collection Time: 01/26/24 12:56 PM   Specimen: BLOOD  Result Value Ref Range Status   Specimen Description   Final    BLOOD BLOOD RIGHT FOREARM Performed at [facility], [address] [place], [place] [address]    Special Requests   Final    BOTTLES DRAWN AEROBIC AND ANAEROBIC Blood Culture results may not be optimal due  to an inadequate volume of blood received in culture bottles Performed at [facility], [address] [place], [place] [address]    Culture  Setup Time   Final    GRAM NEGATIVE RODS AEROBIC BOTTLE ONLY CRITICAL VALUE NOTED.  VALUE IS CONSISTENT WITH PREVIOUSLY REPORTED AND CALLED VALUE. Performed at [facility], [address] [place], [place] [address]    Culture (A)  Final    PSEUDOMONAS AERUGINOSA SUSCEPTIBILITIES PERFORMED ON PREVIOUS CULTURE WITHIN THE LAST 5 DAYS. Performed at [facility], [address], [place], [place] [address]    Report Status 01/29/2024 FINAL  Final  Blood Culture (routine x 2)     Status: Abnormal   Collection Time: 01/26/24  1:01 PM   Specimen: BLOOD  Result Value Ref Range Status   Specimen Description   Final    BLOOD RIGHT ARM Performed at [facility], [address], [place], [place] [address]    Special Requests   Final    BOTTLES DRAWN AEROBIC AND ANAEROBIC Blood Culture adequate volume Performed at [facility], [address], [place], [place] [address]    Culture  Setup  Time   Final    GRAM NEGATIVE RODS AEROBIC BOTTLE ONLY CRITICAL RESULT CALLED TO, READ BACK BY AND VERIFIED WITH:  NATHAN BELUE AT 0541 01/27/24 JG Performed at [facility], [address] [place], [place] [address]    Culture PSEUDOMONAS AERUGINOSA (A)  Final   Report Status 01/29/2024 FINAL  Final   Organism ID, Bacteria PSEUDOMONAS AERUGINOSA  Final      Susceptibility   Pseudomonas aeruginosa - MIC*    MEROPENEM INTERMEDIATE Intermediate     CIPROFLOXACIN 0.5 SENSITIVE Sensitive     IMIPENEM >=16 RESISTANT Resistant     PIP/TAZO Value in next row Sensitive      8 SENSITIVEThis is a modified FDA-approved test that has been validated and its performance characteristics determined by the reporting laboratory.  This laboratory is certified under the Clinical Laboratory Improvement Amendments CLIA as qualified  to perform high complexity clinical laboratory testing.    CEFEPIME Value in next row Sensitive      8 SENSITIVEThis is a modified FDA-approved test that has been validated and its performance characteristics determined by the reporting laboratory.  This laboratory is certified under the Clinical Laboratory Improvement Amendments CLIA as qualified to perform high complexity clinical laboratory testing.    CEFTAZIDIME/AVIBACTAM Value in next row Sensitive      8 SENSITIVEThis is a modified FDA-approved test that has been validated and its performance characteristics determined by the reporting laboratory.  This laboratory is certified under the Clinical Laboratory Improvement Amendments CLIA as qualified to perform high complexity clinical laboratory testing.    CEFTOLOZANE/TAZOBACTAM Value in next row Sensitive      8 SENSITIVEThis is a modified FDA-approved test that has been validated and its performance characteristics determined by the reporting laboratory.  This laboratory is certified under the Clinical Laboratory Improvement Amendments CLIA as qualified to perform high complexity clinical laboratory testing.    TOBRAMYCIN Value in next row Sensitive      8 SENSITIVEThis is a modified FDA-approved test that has been validated and its performance characteristics determined by the reporting laboratory.  This laboratory is certified under the Clinical Laboratory Improvement Amendments CLIA as qualified to perform high complexity clinical laboratory testing.    CEFTAZIDIME Value in next row Sensitive      8 SENSITIVEThis is a modified FDA-approved test that has been validated and its performance characteristics determined by the reporting laboratory.  This laboratory is certified under the Clinical Laboratory Improvement Amendments CLIA as qualified to perform high complexity clinical laboratory testing.    * PSEUDOMONAS AERUGINOSA  Blood Culture ID Panel (Reflexed)     Status: Abnormal   Collection  Time: 01/26/24  1:01 PM  Result Value Ref Range Status   Enterococcus faecalis NOT DETECTED NOT DETECTED Final   Enterococcus Faecium NOT DETECTED NOT DETECTED Final   Listeria monocytogenes NOT DETECTED NOT DETECTED Final   Staphylococcus species NOT DETECTED NOT DETECTED Final   Staphylococcus aureus (BCID) NOT DETECTED NOT DETECTED Final   Staphylococcus epidermidis NOT DETECTED NOT DETECTED Final   Staphylococcus lugdunensis NOT DETECTED NOT DETECTED Final   Streptococcus species NOT DETECTED NOT DETECTED Final   Streptococcus agalactiae NOT DETECTED NOT DETECTED Final   Streptococcus pneumoniae NOT DETECTED NOT DETECTED Final   Streptococcus pyogenes NOT DETECTED NOT DETECTED Final   A.calcoaceticus-baumannii NOT DETECTED NOT DETECTED Final   Bacteroides fragilis NOT DETECTED NOT DETECTED Final   Enterobacterales NOT DETECTED NOT DETECTED Final   Enterobacter cloacae complex NOT DETECTED  NOT DETECTED Final   Escherichia coli NOT DETECTED NOT DETECTED Final   Klebsiella aerogenes NOT DETECTED NOT DETECTED Final   Klebsiella oxytoca NOT DETECTED NOT DETECTED Final   Klebsiella pneumoniae NOT DETECTED NOT DETECTED Final   Proteus species NOT DETECTED NOT DETECTED Final   Salmonella species NOT DETECTED NOT DETECTED Final   Serratia marcescens NOT DETECTED NOT DETECTED Final   Haemophilus influenzae NOT DETECTED NOT DETECTED Final   Neisseria meningitidis NOT DETECTED NOT DETECTED Final   Pseudomonas aeruginosa DETECTED (A) NOT DETECTED Final    Comment: CRITICAL RESULT CALLED TO, READ BACK BY AND VERIFIED WITH:  NATHAN BELUE AT 0541 01/27/24 JG    Stenotrophomonas maltophilia NOT DETECTED NOT DETECTED Final   Candida albicans NOT DETECTED NOT DETECTED Final   Candida auris NOT DETECTED NOT DETECTED Final   Candida glabrata NOT DETECTED NOT DETECTED Final   Candida krusei NOT DETECTED NOT DETECTED Final   Candida parapsilosis NOT DETECTED NOT DETECTED Final   Candida tropicalis  NOT DETECTED NOT DETECTED Final   Cryptococcus neoformans/gattii NOT DETECTED NOT DETECTED Final   CTX-M ESBL NOT DETECTED NOT DETECTED Final   Carbapenem resistance IMP NOT DETECTED NOT DETECTED Final   Carbapenem resistance KPC NOT DETECTED NOT DETECTED Final   Carbapenem resistance NDM NOT DETECTED NOT DETECTED Final   Carbapenem resistance VIM NOT DETECTED NOT DETECTED Final    Comment: Performed at [facility], [address] [place], [place] [address]  Culture, blood (Routine X 2) w Reflex to ID Panel     Status: None (Preliminary result)   Collection Time: 01/29/24  4:38 AM   Specimen: BLOOD  Result Value Ref Range Status   Specimen Description BLOOD BLOOD LEFT ARM  Final   Special Requests   Final    BOTTLES DRAWN AEROBIC AND ANAEROBIC Blood Culture adequate volume   Culture   Final    NO GROWTH 1 DAY Performed at [facility], [address] [place], [place] [address]    Report Status PENDING  Incomplete  Culture, blood (Routine X 2) w Reflex to ID Panel     Status: None (Preliminary result)   Collection Time: 01/29/24  4:43 AM   Specimen: BLOOD  Result Value Ref Range Status   Specimen Description BLOOD BLOOD LEFT HAND  Final   Special Requests   Final    BOTTLES DRAWN AEROBIC AND ANAEROBIC Blood Culture adequate volume   Culture   Final    NO GROWTH 1 DAY Performed at [facility], [address], [place], [place] [address]    Report Status PENDING  Incomplete      Radiology Studies last 3 days: US  RENAL Result Date: 01/29/2024 CLINICAL DATA:  Acute renal failure. EXAM: RENAL / URINARY TRACT ULTRASOUND COMPLETE COMPARISON:  None Available. FINDINGS: Right Kidney: Renal measurements: 10.8 x 4.5 x 5.2 cm = volume: 131 mL. Echogenicity within normal limits. No mass or hydronephrosis visualized. Left Kidney: Renal measurements: 11.2 x 5.2 x 5.0 cm = volume: 150 mL. Echogenicity within normal limits. No mass or hydronephrosis visualized.  Limited definition of both kidneys by ultrasound due to body habitus. Bladder: Appears normal for degree of bladder distention. Other: None. IMPRESSION: Unremarkable renal ultrasound. Electronically Signed   By: Marcey Moan M.D.   On: 01/29/2024 16:06   DG Chest Port 1 View Result Date: 01/28/2024 CLINICAL DATA:  Shortness of breath EXAM: PORTABLE CHEST 1 VIEW COMPARISON:  Chest x-ray 01/26/2024 FINDINGS: Heart is enlarged.  There central pulmonary vascular congestion. There some minimal patchy opacities in the right mid lung. The costophrenic angles are clear. No pneumothorax or acute fracture. There are degenerative changes of the left shoulder. IMPRESSION: 1. Cardiomegaly with central pulmonary vascular congestion. 2. Minimal patchy opacities in the right mid lung may represent atelectasis or infection. Electronically Signed   By: Greig Pique M.D.   On: 01/28/2024 17:49   ECHOCARDIOGRAM COMPLETE Result Date: 01/28/2024    ECHOCARDIOGRAM REPORT   Patient Name:   RONELLE MICHIE Date of Exam: 01/28/2024 Medical Rec #:  [id number]         Height:       64.0 in Accession #:    [id number]        Weight:       308.6 lb Date of Birth:  [birth year]         BSA:          2.353 m Patient Age:    83 years          BP:           117/59 mmHg Patient Gender: F                 HR:           89 bpm. Exam Location:  [facility] Procedure: 2D Echo, Cardiac Doppler and Color Doppler (Both Spectral and Color            Flow Doppler were utilized during procedure). Indications:     Abnormal ECG R94.31  History:         Patient has no prior history of Echocardiogram examinations.                  COPD; Risk Factors:Hypertension and Diabetes.  Sonographer:     Christopher Furnace Referring Phys:  [id number] [facility] Diagnosing Phys: Marsa Dooms MD  Sonographer Comments: Technically challenging study due to limited acoustic windows, patient is obese and no apical window. IMPRESSIONS  1. Left ventricular ejection fraction, by  estimation, is 60 to 65%. The left ventricle has normal function. The left ventricle has no regional wall motion abnormalities. Left ventricular diastolic parameters are indeterminate.  2. Right ventricular systolic function is normal. The right ventricular size is normal.  3. The mitral valve is normal in structure. Mild mitral valve regurgitation. No evidence of mitral stenosis.  4. The aortic valve is normal in structure. Aortic valve regurgitation is not visualized. No aortic stenosis is present.  5. The inferior vena cava is normal in size with greater than 50% respiratory variability, suggesting right atrial pressure of 3 mmHg. FINDINGS  Left Ventricle: Left ventricular ejection fraction, by estimation, is 60 to 65%. The left ventricle has normal function. The left ventricle has no regional wall motion abnormalities. Strain was performed and the global longitudinal strain is indeterminate. The left ventricular internal cavity size was normal in size. There is no left ventricular hypertrophy. Left ventricular diastolic parameters are indeterminate. Right Ventricle: The right ventricular size is normal. No increase in right ventricular wall thickness. Right ventricular systolic function is normal. Left Atrium: Left atrial size was normal in size. Right Atrium: Right atrial size was normal in size. Pericardium: There is no evidence of pericardial effusion. Mitral Valve: The mitral valve is normal in structure. Mild mitral valve regurgitation. No evidence of mitral valve stenosis. Tricuspid Valve: The tricuspid valve is normal in structure. Tricuspid valve regurgitation is mild . No evidence of tricuspid stenosis. Aortic  Valve: The aortic valve is normal in structure. Aortic valve regurgitation is not visualized. No aortic stenosis is present. Pulmonic Valve: The pulmonic valve was normal in structure. Pulmonic valve regurgitation is not visualized. No evidence of pulmonic stenosis. Aorta: The aortic root is  normal in size and structure. Venous: The inferior vena cava is normal in size with greater than 50% respiratory variability, suggesting right atrial pressure of 3 mmHg. IAS/Shunts: No atrial level shunt detected by color flow Doppler. Additional Comments: 3D was performed not requiring image post processing on an independent workstation and was indeterminate.  LEFT VENTRICLE PLAX 2D LVIDd:         4.80 cm LVIDs:         2.80 cm LV PW:         1.00 cm LV IVS:        1.10 cm LVOT diam:     2.00 cm LVOT Area:     3.14 cm  LEFT ATRIUM         Index LA diam:    2.90 cm 1.23 cm/m   AORTA Ao Root diam: 2.70 cm  SHUNTS Systemic Diam: 2.00 cm Marsa Dooms MD Electronically signed by Marsa Dooms MD Signature Date/Time: 01/28/2024/1:33:28 PM    Final    US  Venous Img Lower Unilateral Left (DVT) Result Date: 01/27/2024 EXAM: ULTRASOUND DUPLEX OF THE LEFT LOWER EXTREMITY VEINS TECHNIQUE: Duplex ultrasound using B-mode/gray scaled imaging and Doppler spectral analysis and color flow was obtained of the deep venous structures of the left lower extremity. COMPARISON: None available. CLINICAL HISTORY: Swelling. FINDINGS: There is no evidence of deep venous thrombosis within the left lower extremity. The common femoral vein, femoral vein, and popliteal vein of the left lower extremity demonstrate normal compressibility with normal color flow and spectral analysis. There is limited visualization of the left posterior tibial veins and the left peroneal veins are not identified. IMPRESSION: 1. No evidence of DVT in the left lower extremity. 2. Limited visualization of the left posterior tibial veins and the left peroneal veins are not identified. Electronically signed by: Evalene Coho MD 01/27/2024 05:43 AM EST RP Workstation: DARYLENE Laneta Marsa, DO [facility] 01/30/2024, 2:18 PM    Dictation software may have been used to generate the above note. Typos may occur and escape  review in typed/dictated notes. Please contact Dr Marsa directly for clarity if needed.  Staff may message me via secure chat in Epic  but this may not receive an immediate response,  please page me for urgent matters!  If 7PM-7AM, please contact night coverage [email or web address]

## 2024-01-30 NOTE — [place] FL2 (Signed)
 [place]  MEDICAID FL2 LEVEL OF CARE FORM     IDENTIFICATION  Patient Name: Kelly Hogan Birthdate: 02-Feb-1941 Sex: female Admission Date (Current Location): 01/26/2024  [facility] and [place] Number:  Chiropodist and Address:         Provider Number: [id number]  Attending Physician Name and Address:  Marsa Edelman, DO  Relative Name and Phone Number:       Current Level of Care: Hospital Recommended Level of Care: Skilled Nursing Facility Prior Approval Number:    Date Approved/Denied:   PASRR Number: [id number]  Discharge Plan: SNF    Current Diagnoses: Patient Active Problem List   Diagnosis Date Noted   Bacteremia due to Pseudomonas 01/29/2024   CO2 narcosis 01/29/2024   Metabolic encephalopathy 01/29/2024   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   Sepsis due to cellulitis (HCC) 01/26/2024   Dysuria 12/17/2022   Swelling of lower extremity 07/17/2022   Acute on chronic diastolic congestive heart failure (HCC) 02/09/2020   (HFpEF) heart failure with preserved ejection fraction (HCC) 12/21/2019   Hypervolemia 10/18/2019   AKI (acute kidney injury) 10/14/2019   Pulmonary edema 10/14/2019   OSA (obstructive sleep apnea) 07/17/2019   Obesity hypoventilation syndrome (HCC) 07/17/2019   Acute on chronic respiratory failure with hypoxia (HCC) 07/13/2019   Morbid obesity with BMI of 50.0-59.9, adult (HCC) 07/13/2019   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/2019   HLD (hyperlipidemia) 07/07/2019   CKD (chronic kidney disease), stage IIIa 07/07/2019   Tobacco abuse 07/07/2019   Generalized weakness 07/07/2019   Hyponatremia 07/07/2019   Hyperkalemia 07/07/2019   Normocytic anemia 07/07/2019   Cellulitis and abscess of toe 03/17/2019   Morbid obesity (HCC) 10/17/2015   Acute low back pain 05/22/2015   Gastric ulcer 09/26/2014   Osteopenia 09/07/2013   H/O adenomatous polyp of colon 03/20/2013   COPD (chronic  obstructive pulmonary disease) (HCC) 07/05/2012   Acid reflux 05/04/2012   Chronic respiratory failure (HCC) 11/25/2010   Chronic venous insufficiency 01/29/2010   Type 2 diabetes mellitus (HCC) 11/07/2008   Airway hyperreactivity 11/30/1996   Essential (primary) hypertension 11/30/1996    Orientation RESPIRATION BLADDER Height & Weight     Self, Time, Situation, Place  O2 Incontinent Weight: (!) 142 kg Height:  5' 4 (162.6 cm)  BEHAVIORAL SYMPTOMS/MOOD NEUROLOGICAL BOWEL NUTRITION STATUS      Incontinent Diet  AMBULATORY STATUS COMMUNICATION OF NEEDS Skin   Extensive Assist Verbally Other (Comment) (Wound to Pretibial Right and Left Leg)                       Personal Care Assistance Level of Assistance  Bathing, Feeding, Dressing Bathing Assistance: Limited assistance Feeding assistance: Limited assistance Dressing Assistance: Limited assistance     Functional Limitations Info             SPECIAL CARE FACTORS FREQUENCY  PT (By licensed PT), OT (By licensed OT)     PT Frequency: 3X/Week OT Frequency: 3X/Week            Contractures Contractures Info: Not present    Additional Factors Info  Code Status Code Status Info: DNR-Limited             Current Medications (01/30/2024):  This is the current hospital active medication list Current Facility-Administered Medications  Medication Dose Route Frequency Provider Last Rate Last Admin   acetaminophen  (TYLENOL ) tablet 650 mg  650 mg Oral Q6H PRN Fernand Prost, MD  Or   acetaminophen  (TYLENOL ) suppository 650 mg  650 mg Rectal Q6H PRN Khan, Ghalib, MD   650 mg at 01/27/24 0335   amiodarone (PACERONE) tablet 400 mg  400 mg Oral BID Hudson, Caralyn, PA-C   400 mg at 01/30/24 1130   Followed by   [name] ON 02/05/2024] amiodarone (PACERONE) tablet 200 mg  200 mg Oral Daily Hudson, Caralyn, PA-C       apixaban (ELIQUIS) tablet 2.5 mg  2.5 mg Oral BID Hudson, Caralyn, PA-C   2.5 mg at 01/30/24 1130    bisacodyl  (DULCOLAX) suppository 10 mg  10 mg Rectal Daily PRN Alexander, Natalie, DO       budesonide-glycopyrrolate-formoterol (BREZTRI) 160-9-4.8 MCG/ACT inhaler 2 puff  2 puff Inhalation BID Fernand Prost, MD   2 puff at 01/29/24 2123   cefTAZidime (FORTAZ) 2 g in sodium chloride  0.9 % 100 mL IVPB  2 g Intravenous Q24H Fayette Bodily, MD 200 mL/hr at 01/30/24 1128 2 g at 01/30/24 1128   dextrose 50 % solution 50 mL  1 ampule Intravenous PRN Alexander, Natalie, DO       furosemide  (LASIX ) injection 60 mg  60 mg Intravenous BID Hudson, Caralyn, PA-C   60 mg at 01/30/24 1119   HYDROmorphone (DILAUDID) injection 0.5 mg  0.5 mg Intravenous Q4H PRN Alexander, Natalie, DO       pantoprazole  (PROTONIX ) EC tablet 40 mg  40 mg Oral Daily Khan, Ghalib, MD   40 mg at 01/30/24 1130   polyethylene glycol (MIRALAX  / GLYCOLAX ) packet 17 g  17 g Oral Daily Alexander, Natalie, DO       pravastatin (PRAVACHOL) tablet 20 mg  20 mg Oral QPM Khan, Ghalib, MD   20 mg at 01/29/24 1606   senna-docusate (Senokot-S) tablet 2 tablet  2 tablet Oral QHS PRN Alexander, Natalie, DO       sodium chloride  flush (NS) 0.9 % injection 3 mL  3 mL Intravenous Q12H Khan, Ghalib, MD   3 mL at 01/30/24 1136     Discharge Medications: Please see discharge summary for a list of discharge medications.  Relevant Imaging Results:  Relevant Lab Results:   Additional Information: SS# [id number]    Victory Jackquline RAMAN, RN

## 2024-01-30 NOTE — Plan of Care (Signed)
   Problem: Respiratory: Goal: Ability to maintain adequate ventilation will improve Outcome: Progressing

## 2024-01-30 NOTE — Progress Notes (Signed)
 Patient ID: Kelly Hogan, female   DOB: [birth year], 83 y.o.   MRN: [id number] [facility] Cardiology    SUBJECTIVE: Resting comfortably in bed still with some encephalopathy and confusion denies any pain no fever chills or sweats no nausea vomiting requiring BiPAP   Vitals:   01/29/24 2122 01/29/24 2124 01/30/24 0500 01/30/24 0800  BP:    (!) 152/71  Pulse:    80  Resp:    20  Temp:    97.7 F (36.5 C)  TempSrc:    Axillary  SpO2: (!) 77% 92%  97%  Weight:   (!) 142 kg   Height:         Intake/Output Summary (Last 24 hours) at 01/30/2024 9094 Last data filed at 01/29/2024 1600 Gross per 24 hour  Intake 207.19 ml  Output 850 ml  Net -642.81 ml      PHYSICAL EXAM  General: Well developed, well nourished, in no acute distress HEENT:  Normocephalic and atramatic Neck:  No JVD.  Lungs: Clear bilaterally to auscultation and percussion. Heart: HRRR . Normal S1 and S2 without gallops or murmurs.  Abdomen: Bowel sounds are positive, abdomen soft and non-tender  Msk:  Back normal, normal gait. Normal strength and tone for age. Extremities: No clubbing, cyanosis or edema.   Neuro: Alert and oriented X 3. Psych:  Good affect, responds appropriately   LABS: Basic Metabolic Panel: Recent Labs    01/29/24 0438 01/30/24 0411  NA 139 142  K 4.7 4.4  CL 99 99  CO2 27 29  GLUCOSE 85 65*  BUN 65* 64*  CREATININE 2.86* 2.79*  CALCIUM  9.7 10.1   Liver Function Tests: No results for input(s): AST, ALT, ALKPHOS, BILITOT, PROT, ALBUMIN in the last 72 hours. No results for input(s): LIPASE, AMYLASE in the last 72 hours. CBC: Recent Labs    01/29/24 0438 01/30/24 0411  WBC 16.8* 11.3*  HGB 8.2* 9.0*  HCT 28.0* 28.7*  MCV 95.2 91.4  PLT 248 256   Cardiac Enzymes: No results for input(s): CKTOTAL, CKMB, CKMBINDEX, TROPONINI in the last 72 hours. BNP: Invalid input(s): POCBNP D-Dimer: No results for input(s): DDIMER in the last 72  hours. Hemoglobin A1C: No results for input(s): HGBA1C in the last 72 hours. Fasting Lipid Panel: No results for input(s): CHOL, HDL, LDLCALC, TRIG, CHOLHDL, LDLDIRECT in the last 72 hours. Thyroid Function Tests: No results for input(s): TSH, T4TOTAL, T3FREE, THYROIDAB in the last 72 hours.  Invalid input(s): FREET3 Anemia Panel: No results for input(s): VITAMINB12, FOLATE, FERRITIN, TIBC, IRON, RETICCTPCT in the last 72 hours.  US  RENAL Result Date: 01/29/2024 CLINICAL DATA:  Acute renal failure. EXAM: RENAL / URINARY TRACT ULTRASOUND COMPLETE COMPARISON:  None Available. FINDINGS: Right Kidney: Renal measurements: 10.8 x 4.5 x 5.2 cm = volume: 131 mL. Echogenicity within normal limits. No mass or hydronephrosis visualized. Left Kidney: Renal measurements: 11.2 x 5.2 x 5.0 cm = volume: 150 mL. Echogenicity within normal limits. No mass or hydronephrosis visualized. Limited definition of both kidneys by ultrasound due to body habitus. Bladder: Appears normal for degree of bladder distention. Other: None. IMPRESSION: Unremarkable renal ultrasound. Electronically Signed   By: Marcey Moan M.D.   On: 01/29/2024 16:06   DG Chest Port 1 View Result Date: 01/28/2024 CLINICAL DATA:  Shortness of breath EXAM: PORTABLE CHEST 1 VIEW COMPARISON:  Chest x-ray 01/26/2024 FINDINGS: Heart is enlarged. There central pulmonary vascular congestion. There some minimal patchy opacities in the right mid lung. The costophrenic angles  are clear. No pneumothorax or acute fracture. There are degenerative changes of the left shoulder. IMPRESSION: 1. Cardiomegaly with central pulmonary vascular congestion. 2. Minimal patchy opacities in the right mid lung may represent atelectasis or infection. Electronically Signed   By: Greig Pique M.D.   On: 01/28/2024 17:49     Echo preserved left ventricular function EF of at least 60%  TELEMETRY: Normal sinus rhythm 80 nonspecific ST-T  wave changes:  ASSESSMENT AND PLAN:  Principal Problem:   Sepsis due to cellulitis [facility]) Active Problems:   Essential (primary) hypertension   Type 2 diabetes mellitus (HCC)   Chronic venous insufficiency   HLD (hyperlipidemia)   Morbid obesity with BMI of 50.0-59.9, adult (HCC)   OSA (obstructive sleep apnea)   Obesity hypoventilation syndrome (HCC)   (HFpEF) heart failure with preserved ejection fraction (HCC)   Bacteremia due to Pseudomonas   CO2 narcosis   Metabolic encephalopathy   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nostras verrucosa Chronic renal insufficiency  Plan Cellulitis with bacteremia recommend consider TEE for evaluation of SBE.  Will try to arrange for Monday or Tuesday Hypertension continue hypertension management and control Diabetes agree with continued diabetes management A1c goal less than 7 Acute on chronic renal insufficiency stage III consider nephrology input Hyperlipidemia agree with statin therapy for hyperlipidemia therapy Chronic respiratory failure hypoxemia continue inhalers continue supplemental oxygen recommend pulmonary input Obstructive sleep apnea recommend sleep study CPAP weight loss Obesity recommend weight loss exercise portion control Pseudomonas bacteremia consider SBE consider TEE Atrial fibrillation RVR recommend rate control as well as anticoagulation with Eliquis Metabolic encephalopathy continue current therapy aggressive medical therapy consider neurology input      [name], MD 01/30/2024 9:05 AM

## 2024-01-30 NOTE — Progress Notes (Signed)
 [facility]  PROGRESS NOTE   Subjective:   Patient on BiPAP failure.  Poorly arousable.  Objective:  Vital signs: Blood pressure (!) 152/71, pulse 80, temperature 97.7 F (36.5 C), temperature source Axillary, resp. rate 20, height 5' 4 (1.626 m), weight (!) 142 kg, SpO2 97%.  Intake/Output Summary (Last 24 hours) at 01/30/2024 1401 Last data filed at 01/30/2024 1040 Gross per 24 hour  Intake 7.28 ml  Output 850 ml  Net -842.72 ml   Filed Weights   01/28/24 0500 01/29/24 0357 01/30/24 0500  Weight: (!) 140 kg (!) 140 kg (!) 142 kg     Physical Exam: General:  No acute distress  Head:  Normocephalic, atraumatic. Moist oral mucosal membranes  Eyes:  Anicteric  Neck:  Supple  Lungs:   Clear to auscultation, normal effort  Heart:  S1S2 no rubs  Abdomen:   Soft, nontender, bowel sounds present  Extremities:  peripheral edema.  Neurologic: Lethargic.  Skin:  No lesions  Access:     Basic Metabolic Panel: Recent Labs  Lab 01/26/24 1400 01/27/24 0456 01/27/24 1706 01/28/24 0409 01/29/24 0438 01/30/24 0411  NA 139 138 137 136 139 142  K 4.7 5.6* 5.4* 4.9 4.7 4.4  CL 102 100 99 99 99 99  CO2 24 27 26 27 27 29   GLUCOSE 133* 102* 80 92 85 65*  BUN 54* 58* 60* 63* 65* 64*  CREATININE 2.03* 2.59* 2.70* 2.78* 2.86* 2.79*  CALCIUM  9.1 9.2 9.3 8.9 9.7 10.1  MG 2.2  --   --   --   --   --    GFR: Estimated Creatinine Clearance: 22 mL/min (A) (by C-G formula based on SCr of 2.79 mg/dL (H)).  Liver Function Tests: Recent Labs  Lab 01/26/24 1400  AST 16  ALT 8  ALKPHOS 45  BILITOT 0.4  PROT 6.8  ALBUMIN 3.8   No results for input(s): LIPASE, AMYLASE in the last 168 hours. No results for input(s): AMMONIA in the last 168 hours.  CBC: Recent Labs  Lab 01/26/24 1256 01/27/24 0456 01/28/24 0409 01/29/24 0438 01/30/24 0411  WBC 8.5 24.0* 21.6* 16.8* 11.3*  NEUTROABS 7.9*  --   --   --   --   HGB 10.6* 8.7* 8.1* 8.2* 9.0*  HCT 35.0*  29.4* 27.3* 28.0* 28.7*  MCV 93.1 96.4 96.5 95.2 91.4  PLT 325 249 232 248 256     HbA1C: No results found for: HGBA1C  Urinalysis: No results for input(s): COLORURINE, LABSPEC, PHURINE, GLUCOSEU, HGBUR, BILIRUBINUR, KETONESUR, PROTEINUR, UROBILINOGEN, NITRITE, LEUKOCYTESUR in the last 72 hours.  Invalid input(s): APPERANCEUR    Imaging: US  RENAL Result Date: 01/29/2024 CLINICAL DATA:  Acute renal failure. EXAM: RENAL / URINARY TRACT ULTRASOUND COMPLETE COMPARISON:  None Available. FINDINGS: Right Kidney: Renal measurements: 10.8 x 4.5 x 5.2 cm = volume: 131 mL. Echogenicity within normal limits. No mass or hydronephrosis visualized. Left Kidney: Renal measurements: 11.2 x 5.2 x 5.0 cm = volume: 150 mL. Echogenicity within normal limits. No mass or hydronephrosis visualized. Limited definition of both kidneys by ultrasound due to body habitus. Bladder: Appears normal for degree of bladder distention. Other: None. IMPRESSION: Unremarkable renal ultrasound. Electronically Signed   By: Marcey Moan M.D.   On: 01/29/2024 16:06   DG Chest Port 1 View Result Date: 01/28/2024 CLINICAL DATA:  Shortness of breath EXAM: PORTABLE CHEST 1 VIEW COMPARISON:  Chest x-ray 01/26/2024 FINDINGS: Heart is enlarged. There central pulmonary vascular congestion. There some minimal  patchy opacities in the right mid lung. The costophrenic angles are clear. No pneumothorax or acute fracture. There are degenerative changes of the left shoulder. IMPRESSION: 1. Cardiomegaly with central pulmonary vascular congestion. 2. Minimal patchy opacities in the right mid lung may represent atelectasis or infection. Electronically Signed   By: Greig Pique M.D.   On: 01/28/2024 17:49     Medications:    cefTAZidime (FORTAZ)  IV 2 g (01/30/24 1128)    amiodarone  400 mg Oral BID   Followed by   [name] ON 02/05/2024] amiodarone  200 mg Oral Daily   apixaban  2.5 mg Oral BID    budesonide-glycopyrrolate-formoterol  2 puff Inhalation BID   furosemide   60 mg Intravenous BID   midodrine  5 mg Oral TID WC   pantoprazole   40 mg Oral Daily   pravastatin  20 mg Oral QPM   sodium chloride  flush  3 mL Intravenous Q12H    Assessment/ Plan:     29 female with history of hyperlipoidemia, hypertension, morbid obesity, diabetes, COPD, now admitted with history of supraventricular tachycardia.  She also has cellulitis of the lower extremities.  #1: Acute kidney injury on chronic kidney disease: Patient has a baseline creatinine of 1.83.  Her creatinine is presently 2.7.  Renal ultrasound was negative for obstruction.  #2: Sepsis: Patient has been on Fortaz 2 g.  #3: Hypertension/congestive heart failure: Continue furosemide  60 mg twice a day.  #4: Anemia: Anemia most likely secondary to chronic kidney disease.  Labs and medications reviewed. Will continue to follow along with you.   LOS: 4 Pinkey Edman, MD [facility] 11/15/20252:01 PM

## 2024-01-31 ENCOUNTER — Encounter: Payer: Self-pay | Admitting: Internal Medicine

## 2024-01-31 DIAGNOSIS — G9341 Metabolic encephalopathy: Secondary | ICD-10-CM | POA: Diagnosis not present

## 2024-01-31 DIAGNOSIS — R0689 Other abnormalities of breathing: Secondary | ICD-10-CM | POA: Diagnosis not present

## 2024-01-31 DIAGNOSIS — R7881 Bacteremia: Secondary | ICD-10-CM | POA: Diagnosis not present

## 2024-01-31 LAB — BASIC METABOLIC PANEL WITH GFR
Anion gap: 12 (ref 5–15)
BUN: 65 mg/dL — ABNORMAL HIGH (ref 8–23)
CO2: 35 mmol/L — ABNORMAL HIGH (ref 22–32)
Calcium: 10.3 mg/dL (ref 8.9–10.3)
Chloride: 97 mmol/L — ABNORMAL LOW (ref 98–111)
Creatinine, Ser: 2.32 mg/dL — ABNORMAL HIGH (ref 0.44–1.00)
GFR, Estimated: 20 mL/min — ABNORMAL LOW (ref >60)
Glucose, Bld: 83 mg/dL (ref 70–99)
Potassium: 4.2 mmol/L (ref 3.5–5.1)
Sodium: 144 mmol/L (ref 135–145)

## 2024-01-31 LAB — GLUCOSE, CAPILLARY
Glucose-Capillary: 78 mg/dL (ref 70–99)
Glucose-Capillary: 107 mg/dL — ABNORMAL HIGH (ref 70–99)
Glucose-Capillary: 94 mg/dL (ref 70–99)
Glucose-Capillary: 105 mg/dL — ABNORMAL HIGH (ref 70–99)

## 2024-01-31 MED ORDER — ONDANSETRON HCL 4 MG/2ML IJ SOLN
4.0000 mg | INTRAMUSCULAR | Status: DC | PRN
  Administered 2024-01-31 – 2024-02-01 (×5): 4 mg via INTRAVENOUS
  Filled 2024-01-31 (×5): qty 2

## 2024-01-31 NOTE — Consult Note (Signed)
 WOC Nurse Consult Note: Reason for Consult: wound on left lower abdomen. picture in the chart  Wound type: linear; partial thickness wound under the pannus; most likely related to moisture.   Pressure Injury POA: NA Measurement: see nursing notes Wound bed: 100% clean and pink Drainage (amount, consistency, odor) see nursing flow sheets Periwound:intact, does not seem to have any fungal appearance.  Dressing procedure/placement/frequency: Cover wound with strip of xeroform and top with foam. Change every other day  Re consult if needed, will not follow at this time. Thanks  Eaden [employer]er [employer], RN,CWOCN, CNS, [employer] [phone]

## 2024-01-31 NOTE — Progress Notes (Signed)
 [facility]  PROGRESS NOTE   Subjective:   Lethargic but arousable.  Objective:  Vital signs: Blood pressure (!) 142/61, pulse 75, temperature 97.6 F (36.4 C), temperature source Axillary, resp. rate 12, height 5' 4 (1.626 m), weight 130.6 kg, SpO2 98%.  Intake/Output Summary (Last 24 hours) at 01/31/2024 1122 Last data filed at 01/31/2024 0948 Gross per 24 hour  Intake 120 ml  Output 1100 ml  Net -980 ml   Filed Weights   01/29/24 0357 01/30/24 0500 01/31/24 0430  Weight: (!) 140 kg (!) 142 kg 130.6 kg     Physical Exam: General:  No acute distress  Head:  Normocephalic, atraumatic. Moist oral mucosal membranes  Eyes:  Anicteric  Neck:  Supple  Lungs:   Clear to auscultation, normal effort  Heart:  S1S2 no rubs  Abdomen:   Soft, nontender, bowel sounds present  Extremities:  peripheral edema.  Neurologic:  Awake, alert, following commands  Skin:  No lesions  Access:     Basic Metabolic Panel: Recent Labs  Lab 01/26/24 1400 01/27/24 0456 01/27/24 1706 01/28/24 0409 01/29/24 0438 01/30/24 0411 01/31/24 0557  NA 139   < > 137 136 139 142 144  K 4.7   < > 5.4* 4.9 4.7 4.4 4.2  CL 102   < > 99 99 99 99 97*  CO2 24   < > 26 27 27 29  35*  GLUCOSE 133*   < > 80 92 85 65* 83  BUN 54*   < > 60* 63* 65* 64* 65*  CREATININE 2.03*   < > 2.70* 2.78* 2.86* 2.79* 2.32*  CALCIUM  9.1   < > 9.3 8.9 9.7 10.1 10.3  MG 2.2  --   --   --   --   --   --    < > = values in this interval not displayed.   GFR: Estimated Creatinine Clearance: 25.1 mL/min (A) (by C-G formula based on SCr of 2.32 mg/dL (H)).  Liver Function Tests: Recent Labs  Lab 01/26/24 1400  AST 16  ALT 8  ALKPHOS 45  BILITOT 0.4  PROT 6.8  ALBUMIN 3.8   No results for input(s): LIPASE, AMYLASE in the last 168 hours. No results for input(s): AMMONIA in the last 168 hours.  CBC: Recent Labs  Lab 01/26/24 1256 01/27/24 0456 01/28/24 0409 01/29/24 0438 01/30/24 0411  WBC  8.5 24.0* 21.6* 16.8* 11.3*  NEUTROABS 7.9*  --   --   --   --   HGB 10.6* 8.7* 8.1* 8.2* 9.0*  HCT 35.0* 29.4* 27.3* 28.0* 28.7*  MCV 93.1 96.4 96.5 95.2 91.4  PLT 325 249 232 248 256     HbA1C: No results found for: HGBA1C  Urinalysis: No results for input(s): COLORURINE, LABSPEC, PHURINE, GLUCOSEU, HGBUR, BILIRUBINUR, KETONESUR, PROTEINUR, UROBILINOGEN, NITRITE, LEUKOCYTESUR in the last 72 hours.  Invalid input(s): APPERANCEUR    Imaging: US  RENAL Result Date: 01/29/2024 CLINICAL DATA:  Acute renal failure. EXAM: RENAL / URINARY TRACT ULTRASOUND COMPLETE COMPARISON:  None Available. FINDINGS: Right Kidney: Renal measurements: 10.8 x 4.5 x 5.2 cm = volume: 131 mL. Echogenicity within normal limits. No mass or hydronephrosis visualized. Left Kidney: Renal measurements: 11.2 x 5.2 x 5.0 cm = volume: 150 mL. Echogenicity within normal limits. No mass or hydronephrosis visualized. Limited definition of both kidneys by ultrasound due to body habitus. Bladder: Appears normal for degree of bladder distention. Other: None. IMPRESSION: Unremarkable renal ultrasound. Electronically Signed   By: Marcey  Luverne M.D.   On: 01/29/2024 16:06     Medications:    cefTAZidime (FORTAZ)  IV 2 g (01/31/24 1018)    amiodarone  400 mg Oral BID   Followed by   [name] ON 02/05/2024] amiodarone  200 mg Oral Daily   apixaban  2.5 mg Oral BID   budesonide-glycopyrrolate-formoterol  2 puff Inhalation BID   furosemide   60 mg Intravenous BID   lactose free nutrition  237 mL Oral TID WC   pantoprazole   40 mg Oral Daily   polyethylene glycol  17 g Oral Daily   pravastatin  20 mg Oral QPM   sodium chloride  flush  3 mL Intravenous Q12H    Assessment/ Plan:     56 female with history of hyperlipoidemia, hypertension, morbid obesity, diabetes, COPD, now admitted with history of supraventricular tachycardia.  She also has cellulitis of the lower extremities.   #1: Acute kidney  injury on chronic kidney disease: Patient has a baseline creatinine of 1.83.  Her creatinine is presently 2.32.  Renal ultrasound was negative for obstruction.   #2: Sepsis/cellulitis: Patient has been on Fortaz 2 g.   #3: Hypertension/congestive heart failure: Continue furosemide  60 mg twice a day.   #4: Anemia: Anemia most likely secondary to chronic kidney disease.    Labs and medications reviewed. Will continue to follow along with you.   LOS: 5 Pinkey Edman, MD [facility] 11/16/202511:22 AM

## 2024-01-31 NOTE — Progress Notes (Signed)
 PROGRESS NOTE    Kelly Hogan   FMW:[id number] DOB: Mar 07, 1941  DOA: 01/26/2024 Date of Service: 01/31/24 which is hospital day 5  PCP: [name] [name] LABOR, MD    Hospital course / significant events:   HPI: Kelly Hogan is a 83 y.o. 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    11/11: admitted to hospitalist w/ hypotension, cellulitis. Metronidazole and cefepime in the ED. SVT with rates of 140s, pt requiring amio gtt.   11/12: (+)Pseudomonas bacteremia - linezolid and vancomycin. Remains on amio gtt for now, cardiology will see in AM. Was off amio briefly this evening d/t limited IV access and needed other Rx, HR was WNL but then up again and back on the drip. Renal fxn worse, resp acidosis and hypercarbia, pt placed on BiPAP. Fluids changed to bicarb infusion 1L. Hyperkalemia, mild, gave K shifting Rx w/ insulin/D50, also admin Lokelma.  11/13: renal fxn continuing to worsen - Nephrology consulted. Pt remains on BiPAP this morning. Was off briefly per her request but O2 down and SOB so placed back on the BiPAP. BP soft, gave another 1L fluids. Cardiology - transition to po amio, starting eliquis. ID recs change ceftazidime d/t myoclonus effects and confusion. Still worsening into the afternoon, CXR and BNP indicative of HFpEF w/ pulmonary edema despite fairly clear lungs (but exam difficult). Son is ok for trial of diuretics despite renal fxn.  11/14: good UOP and respiratory is improved onto [place] O2, renal fxn about same. Continuing on ceftazidime. Changed morphine to dilaudid w/ renal fxn. Renal US  no concerns. Cr 2.86. BCx repeated. Requiring BiPAP at night  11/15: somnolent but alerts to voice. Has not wanted to sit up in bed/chair. Cr  2.79. UOP yesterday looks like under documented. Continue diuresis. Repeat BCx NG thus far.  11/16: Cr 2.32, up to chair today encouraging.      Consultants:  Cardiology  Infectious disease   Procedures/Surgeries: none      ASSESSMENT & PLAN:   Cellulitis of the left lower extremity. Present on admission and ruled out DVT (though US  exam somewhat limited) (+)Pseudomonas bacteremia Dc cefepime d/t myoclonus / neuro effects. Continue on ceftazidime.  ID following  Monitor cultures --> initial cultures 11/11 sensitive to ceftazidime, repeat cultures 11/14 are negative thus far  trend leukocyte count and fever curve.   ID consult for bacteremia - likely cellulitis but eval lungs as well may need CT    HFpEF Exacerbation d/t SVT + fluid tx per sepsis protocol  Echo noted preserved EF but unable to assess diastolic fxn, likely is dysfunctional. No significant valve dysfunction.  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   Midodrine dc today hold antihypertensives. Cardiology following    Type 2 diabetes Monitor glc Relatively low glc, will hold on SSI for now   Hyperlipidemia:  statin    Chronic venous stasis Rec follow w/ lymphedema clinic   Skin nodularity bilateral lower extremities Question hyperkeratotic lesions? Keloid type lesions?  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 Discussed I support no CPR / intubation, should she be weak enough to experience cardiopulmonary arrest. 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  Underweight - under 18  overweight - 25 to 29 obese - 30 or more Class 1 obesity: BMI of 30.0 to 34 Class 2 obesity: BMI of 35.0 to 39 Class 3 obesity: BMI of 40.0 to 49 Super Morbid Obesity: BMI 50-59 Super-super Morbid Obesity: BMI 60+ Healthy nutrition and physical activity advised as adjunct to other disease management and risk reduction treatments    DVT prophylaxis: lovenox  IV fluids:dc Nutrition: dysphagia diet for now  Unumprovident / other devices: none  Code Status: DNR per MOST form which was completed by the patient  ACP documentation reviewed: none on file in [facility]  [facility] needs: SNF  Medical barriers to dispo: IV abx and bacteremia. Expected  medical readiness for discharge several days at least.              Subjective / Brief ROS:  Patient reports dry mouth Awakens to voice, minimal responses  Denies CP Reports feels about same as yesterday   Family Communication:call to son darold 01/31/24 5:25 PM updates given all questions answered        Objective Findings:  Vitals:   01/31/24 0100 01/31/24 0300 01/31/24 0430 01/31/24 0900  BP: (!) 118/50 (!) 142/61    Pulse: 75     Resp: 12     Temp:  (!) 97.5 F (36.4 C)  97.6 F (36.4 C)  TempSrc:  Axillary  Axillary  SpO2: 98%     Weight:   130.6 kg   Height:        Intake/Output Summary (Last 24 hours) at 01/31/2024 1725 Last data filed at 01/31/2024 1000 Gross per 24 hour  Intake 120 ml  Output 1800 ml  Net -1680 ml   Filed Weights   01/29/24 0357 01/30/24 0500 01/31/24 0430  Weight: (!) 140 kg (!) 142 kg 130.6 kg    Examination:  Physical Exam Constitutional:  General: She is not in acute distress.    Appearance: She is ill-appearing.     Comments: Alerts to voice but groggy and giving one-word answers  Cardiovascular:     Rate and Rhythm: Normal rate and regular rhythm.  Pulmonary:     Effort: Tachypnea present. No respiratory distress.     Breath sounds: Decreased air movement present.  Musculoskeletal:     Right lower leg: Edema present.     Left lower leg: Edema present.  Skin:    Comments: See photos  Neurological:     Mental Status: She is alert and oriented to person, place, and time. Mental status is at baseline.  Psychiatric:        Mood and Affect: Mood normal.        Behavior: Behavior normal.       01/27/24 - erythema improved     01/26/24     Scheduled Medications:   amiodarone  400 mg Oral BID   Followed by   [name] ON 02/05/2024] amiodarone  200 mg Oral Daily   apixaban  2.5 mg Oral BID   budesonide-glycopyrrolate-formoterol  2 puff Inhalation BID   furosemide   60 mg Intravenous BID   lactose  free nutrition  237 mL Oral TID WC   pantoprazole   40 mg Oral Daily   polyethylene glycol  17 g Oral Daily   pravastatin  20 mg Oral QPM   sodium chloride  flush  3 mL Intravenous Q12H    Continuous Infusions:  cefTAZidime (FORTAZ)  IV 2 g (01/31/24 1018)    PRN Medications:  acetaminophen  **OR** acetaminophen , bisacodyl , dextrose, HYDROmorphone (DILAUDID) injection, ondansetron  (ZOFRAN ) IV, senna-docusate  Antimicrobials from admission:  Anti-infectives (From admission, onward)    Start     Dose/Rate Route Frequency Ordered Stop   01/29/24 1000  cefTAZidime (FORTAZ) 2 g in sodium chloride  0.9 % 100 mL IVPB        2 g 200 mL/hr over 30 Minutes Intravenous Every 24 hours 01/28/24 1651     01/28/24 1600  vancomycin (VANCOCIN) IVPB 1000 mg/200 mL premix  Status:  Discontinued       Placed in Followed by Linked Group   1,000 mg 200 mL/hr over 60 Minutes Intravenous Every 36 hours 01/27/24 0241 01/27/24 1045   01/27/24 2200  linezolid (ZYVOX) IVPB 600 mg  Status:  Discontinued        600 mg 300 mL/hr over 60 Minutes Intravenous Every 12 hours 01/27/24 1046 01/28/24 1618   01/27/24 1200  ceFEPIme (MAXIPIME) 2 g in sodium chloride  0.9 % 100 mL IVPB  Status:  Discontinued        2 g 200 mL/hr over 30 Minutes Intravenous Every 24 hours 01/27/24 0228 01/28/24 1651   01/27/24 0400  vancomycin (VANCOREADY) IVPB 2000 mg/400 mL       Placed in Followed by Linked Group   2,000 mg 200 mL/hr over 120 Minutes Intravenous  Once 01/27/24 0241 01/27/24 0713   01/27/24 0100  ceFAZolin (ANCEF) IVPB 2g/100 mL premix  Status:  Discontinued        2 g 200 mL/hr over 30 Minutes Intravenous Every 8 hours 01/27/24 0037 01/27/24 0042   01/26/24 1300  ceFEPIme (MAXIPIME) 2 g in sodium chloride  0.9 % 100 mL IVPB        2 g 200 mL/hr over 30 Minutes Intravenous  Once 01/26/24 1256 01/26/24 1350   01/26/24 1300  metroNIDAZOLE (FLAGYL) IVPB 500 mg  500 mg 100 mL/hr over 60 Minutes Intravenous  Once  01/26/24 1256 01/26/24 1559   01/26/24 1300  vancomycin (VANCOCIN) IVPB 1000 mg/200 mL premix        1,000 mg 200 mL/hr over 60 Minutes Intravenous  Once 01/26/24 1256 01/26/24 1458           Data Reviewed:  I have personally reviewed the following...  CBC: Recent Labs  Lab 01/26/24 1256 01/27/24 0456 01/28/24 0409 01/29/24 0438 01/30/24 0411  WBC 8.5 24.0* 21.6* 16.8* 11.3*  NEUTROABS 7.9*  --   --   --   --   HGB 10.6* 8.7* 8.1* 8.2* 9.0*  HCT 35.0* 29.4* 27.3* 28.0* 28.7*  MCV 93.1 96.4 96.5 95.2 91.4  PLT 325 249 232 248 256   Basic Metabolic Panel: Recent Labs  Lab 01/26/24 1400 01/27/24 0456 01/27/24 1706 01/28/24 0409 01/29/24 0438 01/30/24 0411 01/31/24 0557  NA 139   < > 137 136 139 142 144  K 4.7   < > 5.4* 4.9 4.7 4.4 4.2  CL 102   < > 99 99 99 99 97*  CO2 24   < > 26 27 27 29  35*  GLUCOSE 133*   < > 80 92 85 65* 83  BUN 54*   < > 60* 63* 65* 64* 65*  CREATININE 2.03*   < > 2.70* 2.78* 2.86* 2.79* 2.32*  CALCIUM  9.1   < > 9.3 8.9 9.7 10.1 10.3  MG 2.2  --   --   --   --   --   --    < > = values in this interval not displayed.   GFR: Estimated Creatinine Clearance: 25.1 mL/min (A) (by C-G formula based on SCr of 2.32 mg/dL (H)). Liver Function Tests: Recent Labs  Lab 01/26/24 1400  AST 16  ALT 8  ALKPHOS 45  BILITOT 0.4  PROT 6.8  ALBUMIN 3.8   No results for input(s): LIPASE, AMYLASE in the last 168 hours. No results for input(s): AMMONIA in the last 168 hours. Coagulation Profile: Recent Labs  Lab 01/26/24 1256  INR 1.0   Cardiac Enzymes: No results for input(s): CKTOTAL, CKMB, CKMBINDEX, TROPONINI in the last 168 hours. BNP (last 3 results) Recent Labs    01/26/24 1256 01/28/24 0409  PROBNP 121.0 9,777.0*   HbA1C: No results for input(s): HGBA1C in the last 72 hours. CBG: Recent Labs  Lab 01/30/24 1154 01/30/24 1708 01/30/24 2217 01/31/24 0820 01/31/24 1141  GLUCAP 78 79 88 78 107*   Lipid  Profile: No results for input(s): CHOL, HDL, LDLCALC, TRIG, CHOLHDL, LDLDIRECT in the last 72 hours. Thyroid Function Tests: No results for input(s): TSH, T4TOTAL, FREET4, T3FREE, THYROIDAB in the last 72 hours.  Anemia Panel: No results for input(s): VITAMINB12, FOLATE, FERRITIN, TIBC, IRON, RETICCTPCT in the last 72 hours. Most Recent Urinalysis On File:     Component Value Date/Time   COLORURINE STRAW (A) 01/26/2024 1430   APPEARANCEUR CLEAR (A) 01/26/2024 1430   APPEARANCEUR Cloudy 10/14/2012 2100   LABSPEC 1.009 01/26/2024 1430   LABSPEC 1.016 10/14/2012 2100   PHURINE 5.0 01/26/2024 1430   GLUCOSEU 150 (A) 01/26/2024 1430   GLUCOSEU Negative 10/14/2012 2100   HGBUR NEGATIVE 01/26/2024 1430   BILIRUBINUR NEGATIVE 01/26/2024 1430   BILIRUBINUR Negative 10/14/2012 2100   KETONESUR NEGATIVE 01/26/2024 1430   PROTEINUR 100 (A) 01/26/2024 1430   NITRITE NEGATIVE 01/26/2024 1430   LEUKOCYTESUR NEGATIVE 01/26/2024 1430   LEUKOCYTESUR Negative 10/14/2012 2100  Sepsis Labs: @LABRCNTIP (procalcitonin:4,lacticidven:4) Microbiology: Recent Results (from the past 240 hours)  Resp panel by RT-PCR (RSV, Flu A&B, Covid) Anterior Nasal Swab     Status: None   Collection Time: 01/26/24 12:56 PM   Specimen: Anterior Nasal Swab  Result Value Ref Range Status   SARS Coronavirus 2 by RT PCR NEGATIVE NEGATIVE Final    Comment: (NOTE) SARS-CoV-2 target nucleic acids are NOT DETECTED.  The SARS-CoV-2 RNA is generally detectable in upper respiratory specimens during the acute phase of infection. The lowest concentration of SARS-CoV-2 viral copies this assay can detect is 138 copies/mL. A negative result does not preclude SARS-Cov-2 infection and should not be used as the sole basis for treatment or other patient management decisions. A negative result may occur with  improper specimen collection/handling, submission of specimen other than nasopharyngeal  swab, presence of viral mutation(s) within the areas targeted by this assay, and inadequate number of viral copies(<138 copies/mL). A negative result must be combined with clinical observations, patient history, and epidemiological information. The expected result is Negative.  Fact Sheet for Patients:  bloggercourse.com  Fact Sheet for Healthcare Providers:  seriousbroker.it  This test is no t yet approved or cleared by the [place]  FDA and  has been authorized for detection and/or diagnosis of SARS-CoV-2 by FDA under an Emergency Use Authorization (EUA). This EUA will remain  in effect (meaning this test can be used) for the duration of the COVID-19 declaration under Section 564(b)(1) of the Act, 21 U.S.C.section 360bbb-3(b)(1), unless the authorization is terminated  or revoked sooner.       Influenza A by PCR NEGATIVE NEGATIVE Final   Influenza B by PCR NEGATIVE NEGATIVE Final    Comment: (NOTE) The Xpert Xpress SARS-CoV-2/FLU/RSV plus assay is intended as an aid in the diagnosis of influenza from Nasopharyngeal swab specimens and should not be used as a sole basis for treatment. Nasal washings and aspirates are unacceptable for Xpert Xpress SARS-CoV-2/FLU/RSV testing.  Fact Sheet for Patients: bloggercourse.com  Fact Sheet for Healthcare Providers: seriousbroker.it  This test is not yet approved or cleared by the [place]  FDA and has been authorized for detection and/or diagnosis of SARS-CoV-2 by FDA under an Emergency Use Authorization (EUA). This EUA will remain in effect (meaning this test can be used) for the duration of the COVID-19 declaration under Section 564(b)(1) of the Act, 21 U.S.C. section 360bbb-3(b)(1), unless the authorization is terminated or revoked.     Resp Syncytial Virus by PCR NEGATIVE NEGATIVE Final    Comment: (NOTE) Fact Sheet for  Patients: bloggercourse.com  Fact Sheet for Healthcare Providers: seriousbroker.it  This test is not yet approved or cleared by the [place]  FDA and has been authorized for detection and/or diagnosis of SARS-CoV-2 by FDA under an Emergency Use Authorization (EUA). This EUA will remain in effect (meaning this test can be used) for the duration of the COVID-19 declaration under Section 564(b)(1) of the Act, 21 U.S.C. section 360bbb-3(b)(1), unless the authorization is terminated or revoked.  Performed at [facility], [address] [place], [place] [address]   Blood Culture (routine x 2)     Status: Abnormal   Collection Time: 01/26/24 12:56 PM   Specimen: BLOOD  Result Value Ref Range Status   Specimen Description   Final    BLOOD BLOOD RIGHT FOREARM Performed at [facility], [address] [place], [place] [address]    Special Requests   Final    BOTTLES DRAWN AEROBIC AND  ANAEROBIC Blood Culture results may not be optimal due to an inadequate volume of blood received in culture bottles Performed at [facility], [address] [place], [place] [address]    Culture  Setup Time   Final    GRAM NEGATIVE RODS AEROBIC BOTTLE ONLY CRITICAL VALUE NOTED.  VALUE IS CONSISTENT WITH PREVIOUSLY REPORTED AND CALLED VALUE. Performed at [facility], [address] [place], [place] [address]    Culture (A)  Final    PSEUDOMONAS AERUGINOSA SUSCEPTIBILITIES PERFORMED ON PREVIOUS CULTURE WITHIN THE LAST 5 DAYS. Performed at [facility], [address] [place], [place] [address]    Report Status 01/29/2024 FINAL  Final  Blood Culture (routine x 2)     Status: Abnormal   Collection Time: 01/26/24  1:01 PM   Specimen: BLOOD  Result Value Ref Range Status   Specimen Description   Final    BLOOD RIGHT ARM Performed at [facility], [address], [place], [place] [address]     Special Requests   Final    BOTTLES DRAWN AEROBIC AND ANAEROBIC Blood Culture adequate volume Performed at [facility], [address] [place], [place] [address]    Culture  Setup Time   Final    GRAM NEGATIVE RODS AEROBIC BOTTLE ONLY CRITICAL RESULT CALLED TO, READ BACK BY AND VERIFIED WITH:  NATHAN BELUE AT 0541 01/27/24 JG Performed at [facility], [address] [place], [place] [address]    Culture PSEUDOMONAS AERUGINOSA (A)  Final   Report Status 01/29/2024 FINAL  Final   Organism ID, Bacteria PSEUDOMONAS AERUGINOSA  Final      Susceptibility   Pseudomonas aeruginosa - MIC*    MEROPENEM INTERMEDIATE Intermediate     CIPROFLOXACIN 0.5 SENSITIVE Sensitive     IMIPENEM >=16 RESISTANT Resistant     PIP/TAZO Value in next row Sensitive      8 SENSITIVEThis is a modified FDA-approved test that has been validated and its performance characteristics determined by the reporting laboratory.  This laboratory is certified under the Clinical Laboratory Improvement Amendments CLIA as qualified to perform high complexity clinical laboratory testing.    CEFEPIME Value in next row Sensitive      8 SENSITIVEThis is a modified FDA-approved test that has been validated and its performance characteristics determined by the reporting laboratory.  This laboratory is certified under the Clinical Laboratory Improvement Amendments CLIA as qualified to perform high complexity clinical laboratory testing.    CEFTAZIDIME/AVIBACTAM Value in next row Sensitive      8 SENSITIVEThis is a modified FDA-approved test that has been validated and its performance characteristics determined by the reporting laboratory.  This laboratory is certified under the Clinical Laboratory Improvement Amendments CLIA as qualified to perform high complexity clinical laboratory testing.    CEFTOLOZANE/TAZOBACTAM Value in next row Sensitive      8 SENSITIVEThis is a modified FDA-approved test that has been validated  and its performance characteristics determined by the reporting laboratory.  This laboratory is certified under the Clinical Laboratory Improvement Amendments CLIA as qualified to perform high complexity clinical laboratory testing.    TOBRAMYCIN Value in next row Sensitive      8 SENSITIVEThis is a modified FDA-approved test that has been validated and its performance characteristics determined by the reporting laboratory.  This laboratory is certified under the Clinical Laboratory Improvement Amendments CLIA as qualified to perform high complexity clinical laboratory testing.    CEFTAZIDIME Value in next  row Sensitive      8 SENSITIVEThis is a modified FDA-approved test that has been validated and its performance characteristics determined by the reporting laboratory.  This laboratory is certified under the Clinical Laboratory Improvement Amendments CLIA as qualified to perform high complexity clinical laboratory testing.    * PSEUDOMONAS AERUGINOSA  Blood Culture ID Panel (Reflexed)     Status: Abnormal   Collection Time: 01/26/24  1:01 PM  Result Value Ref Range Status   Enterococcus faecalis NOT DETECTED NOT DETECTED Final   Enterococcus Faecium NOT DETECTED NOT DETECTED Final   Listeria monocytogenes NOT DETECTED NOT DETECTED Final   Staphylococcus species NOT DETECTED NOT DETECTED Final   Staphylococcus aureus (BCID) NOT DETECTED NOT DETECTED Final   Staphylococcus epidermidis NOT DETECTED NOT DETECTED Final   Staphylococcus lugdunensis NOT DETECTED NOT DETECTED Final   Streptococcus species NOT DETECTED NOT DETECTED Final   Streptococcus agalactiae NOT DETECTED NOT DETECTED Final   Streptococcus pneumoniae NOT DETECTED NOT DETECTED Final   Streptococcus pyogenes NOT DETECTED NOT DETECTED Final   A.calcoaceticus-baumannii NOT DETECTED NOT DETECTED Final   Bacteroides fragilis NOT DETECTED NOT DETECTED Final   Enterobacterales NOT DETECTED NOT DETECTED Final   Enterobacter cloacae  complex NOT DETECTED NOT DETECTED Final   Escherichia coli NOT DETECTED NOT DETECTED Final   Klebsiella aerogenes NOT DETECTED NOT DETECTED Final   Klebsiella oxytoca NOT DETECTED NOT DETECTED Final   Klebsiella pneumoniae NOT DETECTED NOT DETECTED Final   Proteus species NOT DETECTED NOT DETECTED Final   Salmonella species NOT DETECTED NOT DETECTED Final   Serratia marcescens NOT DETECTED NOT DETECTED Final   Haemophilus influenzae NOT DETECTED NOT DETECTED Final   Neisseria meningitidis NOT DETECTED NOT DETECTED Final   Pseudomonas aeruginosa DETECTED (A) NOT DETECTED Final    Comment: CRITICAL RESULT CALLED TO, READ BACK BY AND VERIFIED WITH:  NATHAN BELUE AT 0541 01/27/24 JG    Stenotrophomonas maltophilia NOT DETECTED NOT DETECTED Final   Candida albicans NOT DETECTED NOT DETECTED Final   Candida auris NOT DETECTED NOT DETECTED Final   Candida glabrata NOT DETECTED NOT DETECTED Final   Candida krusei NOT DETECTED NOT DETECTED Final   Candida parapsilosis NOT DETECTED NOT DETECTED Final   Candida tropicalis NOT DETECTED NOT DETECTED Final   Cryptococcus neoformans/gattii NOT DETECTED NOT DETECTED Final   CTX-M ESBL NOT DETECTED NOT DETECTED Final   Carbapenem resistance IMP NOT DETECTED NOT DETECTED Final   Carbapenem resistance KPC NOT DETECTED NOT DETECTED Final   Carbapenem resistance NDM NOT DETECTED NOT DETECTED Final   Carbapenem resistance VIM NOT DETECTED NOT DETECTED Final    Comment: Performed at [facility], [address] [place], [place] [address]  Culture, blood (Routine X 2) w Reflex to ID Panel     Status: None (Preliminary result)   Collection Time: 01/29/24  4:38 AM   Specimen: BLOOD  Result Value Ref Range Status   Specimen Description BLOOD BLOOD LEFT ARM  Final   Special Requests   Final    BOTTLES DRAWN AEROBIC AND ANAEROBIC Blood Culture adequate volume   Culture   Final    NO GROWTH 2 DAYS Performed at [facility], [address] [place], [place] [address]    Report Status PENDING  Incomplete  Culture, blood (Routine X 2) w Reflex to ID Panel     Status: None (Preliminary result)   Collection Time: 01/29/24  4:43 AM   Specimen: BLOOD  Result Value Ref  Range Status   Specimen Description BLOOD BLOOD LEFT HAND  Final   Special Requests   Final    BOTTLES DRAWN AEROBIC AND ANAEROBIC Blood Culture adequate volume   Culture   Final    NO GROWTH 2 DAYS Performed at [facility], [address] [place], [place] [address]    Report Status PENDING  Incomplete      Radiology Studies last 3 days: US  RENAL Result Date: 01/29/2024 CLINICAL DATA:  Acute renal failure. EXAM: RENAL / URINARY TRACT ULTRASOUND COMPLETE COMPARISON:  None Available. FINDINGS: Right Kidney: Renal measurements: 10.8 x 4.5 x 5.2 cm = volume: 131 mL. Echogenicity within normal limits. No mass or hydronephrosis visualized. Left Kidney: Renal measurements: 11.2 x 5.2 x 5.0 cm = volume: 150 mL. Echogenicity within normal limits. No mass or hydronephrosis visualized. Limited definition of both kidneys by ultrasound due to body habitus. Bladder: Appears normal for degree of bladder distention. Other: None. IMPRESSION: Unremarkable renal ultrasound. Electronically Signed   By: Marcey Moan M.D.   On: 01/29/2024 16:06   DG Chest Port 1 View Result Date: 01/28/2024 CLINICAL DATA:  Shortness of breath EXAM: PORTABLE CHEST 1 VIEW COMPARISON:  Chest x-ray 01/26/2024 FINDINGS: Heart is enlarged. There central pulmonary vascular congestion. There some minimal patchy opacities in the right mid lung. The costophrenic angles are clear. No pneumothorax or acute fracture. There are degenerative changes of the left shoulder. IMPRESSION: 1. Cardiomegaly with central pulmonary vascular congestion. 2. Minimal patchy opacities in the right mid lung may represent atelectasis or infection. Electronically Signed   By: Greig Pique M.D.   On: 01/28/2024 17:49    ECHOCARDIOGRAM COMPLETE Result Date: 01/28/2024    ECHOCARDIOGRAM REPORT   Patient Name:   LATRICIA CERRITO Date of Exam: 01/28/2024 Medical Rec #:  [id number]         Height:       64.0 in Accession #:    [id number]        Weight:       308.6 lb Date of Birth:  [birth year]         BSA:          2.353 m Patient Age:    83 years          BP:           117/59 mmHg Patient Gender: F                 HR:           89 bpm. Exam Location:  [facility] Procedure: 2D Echo, Cardiac Doppler and Color Doppler (Both Spectral and Color            Flow Doppler were utilized during procedure). Indications:     Abnormal ECG R94.31  History:         Patient has no prior history of Echocardiogram examinations.                  COPD; Risk Factors:Hypertension and Diabetes.  Sonographer:     Christopher Furnace Referring Phys:  [id number] [facility] Diagnosing Phys: Marsa Dooms MD  Sonographer Comments: Technically challenging study due to limited acoustic windows, patient is obese and no apical window. IMPRESSIONS  1. Left ventricular ejection fraction, by estimation, is 60 to 65%. The left ventricle has normal function. The left ventricle has no regional wall motion abnormalities. Left ventricular diastolic parameters are indeterminate.  2. Right ventricular systolic function is normal. The right ventricular  size is normal.  3. The mitral valve is normal in structure. Mild mitral valve regurgitation. No evidence of mitral stenosis.  4. The aortic valve is normal in structure. Aortic valve regurgitation is not visualized. No aortic stenosis is present.  5. The inferior vena cava is normal in size with greater than 50% respiratory variability, suggesting right atrial pressure of 3 mmHg. FINDINGS  Left Ventricle: Left ventricular ejection fraction, by estimation, is 60 to 65%. The left ventricle has normal function. The left ventricle has no regional wall motion abnormalities. Strain was performed and the global longitudinal strain is  indeterminate. The left ventricular internal cavity size was normal in size. There is no left ventricular hypertrophy. Left ventricular diastolic parameters are indeterminate. Right Ventricle: The right ventricular size is normal. No increase in right ventricular wall thickness. Right ventricular systolic function is normal. Left Atrium: Left atrial size was normal in size. Right Atrium: Right atrial size was normal in size. Pericardium: There is no evidence of pericardial effusion. Mitral Valve: The mitral valve is normal in structure. Mild mitral valve regurgitation. No evidence of mitral valve stenosis. Tricuspid Valve: The tricuspid valve is normal in structure. Tricuspid valve regurgitation is mild . No evidence of tricuspid stenosis. Aortic Valve: The aortic valve is normal in structure. Aortic valve regurgitation is not visualized. No aortic stenosis is present. Pulmonic Valve: The pulmonic valve was normal in structure. Pulmonic valve regurgitation is not visualized. No evidence of pulmonic stenosis. Aorta: The aortic root is normal in size and structure. Venous: The inferior vena cava is normal in size with greater than 50% respiratory variability, suggesting right atrial pressure of 3 mmHg. IAS/Shunts: No atrial level shunt detected by color flow Doppler. Additional Comments: 3D was performed not requiring image post processing on an independent workstation and was indeterminate.  LEFT VENTRICLE PLAX 2D LVIDd:         4.80 cm LVIDs:         2.80 cm LV PW:         1.00 cm LV IVS:        1.10 cm LVOT diam:     2.00 cm LVOT Area:     3.14 cm  LEFT ATRIUM         Index LA diam:    2.90 cm 1.23 cm/m   AORTA Ao Root diam: 2.70 cm  SHUNTS Systemic Diam: 2.00 cm Marsa Dooms MD Electronically signed by Marsa Dooms MD Signature Date/Time: 01/28/2024/1:33:28 PM    Final         Laneta Marsa, DO [facility] 01/31/2024, 5:25 PM    Dictation software may have been used to  generate the above note. Typos may occur and escape review in typed/dictated notes. Please contact Dr Marsa directly for clarity if needed.  Staff may message me via secure chat in Epic  but this may not receive an immediate response,  please page me for urgent matters!  If 7PM-7AM, please contact night coverage [email or web address]

## 2024-01-31 NOTE — Progress Notes (Signed)
 Patient ID: Kelly Hogan, female   DOB: 1940/10/03, 83 y.o.   MRN: [id number] [facility]    SUBJECTIVE: Resting comfortably in bed mostly asleep send states she is arousable no pain denies fever chills or sweats   Vitals:   01/31/24 0100 01/31/24 0300 01/31/24 0430 01/31/24 0900  BP: (!) 118/50 (!) 142/61    Pulse: 75     Resp: 12     Temp:  (!) 97.5 F (36.4 C)  97.6 F (36.4 C)  TempSrc:  Axillary  Axillary  SpO2: 98%     Weight:   130.6 kg   Height:         Intake/Output Summary (Last 24 hours) at 01/31/2024 9041 Last data filed at 01/31/2024 0948 Gross per 24 hour  Intake 120 ml  Output 1100 ml  Net -980 ml      PHYSICAL EXAM  General: Well developed, well nourished, in no acute distress HEENT:  Normocephalic and atramatic Neck:  No JVD.  Lungs: Clear bilaterally to auscultation and percussion. Heart: HRRR . Normal S1 and S2 without gallops or murmurs.  Abdomen: Bowel sounds are positive, abdomen soft and non-tender  Msk:  Back normal, normal gait. Normal strength and tone for age. Extremities: No clubbing, cyanosis or 4+ edema.  No evidence of cellulitis lower leg wrapped Neuro: Alert and oriented X 3 lethargic. Psych:  Good affect, responds appropriately   LABS: Basic Metabolic Panel: Recent Labs    01/30/24 0411 01/31/24 0557  NA 142 144  K 4.4 4.2  CL 99 97*  CO2 29 35*  GLUCOSE 65* 83  BUN 64* 65*  CREATININE 2.79* 2.32*  CALCIUM  10.1 10.3   Liver Function Tests: No results for input(s): AST, ALT, ALKPHOS, BILITOT, PROT, ALBUMIN in the last 72 hours. No results for input(s): LIPASE, AMYLASE in the last 72 hours. CBC: Recent Labs    01/29/24 0438 01/30/24 0411  WBC 16.8* 11.3*  HGB 8.2* 9.0*  HCT 28.0* 28.7*  MCV 95.2 91.4  PLT 248 256   Cardiac Enzymes: No results for input(s): CKTOTAL, CKMB, CKMBINDEX, TROPONINI in the last 72 hours. BNP: Invalid input(s): POCBNP D-Dimer: No results for  input(s): DDIMER in the last 72 hours. Hemoglobin A1C: No results for input(s): HGBA1C in the last 72 hours. Fasting Lipid Panel: No results for input(s): CHOL, HDL, LDLCALC, TRIG, CHOLHDL, LDLDIRECT in the last 72 hours. Thyroid Function Tests: No results for input(s): TSH, T4TOTAL, T3FREE, THYROIDAB in the last 72 hours.  Invalid input(s): FREET3 Anemia Panel: No results for input(s): VITAMINB12, FOLATE, FERRITIN, TIBC, IRON, RETICCTPCT in the last 72 hours.  US  RENAL Result Date: 01/29/2024 CLINICAL DATA:  Acute renal failure. EXAM: RENAL / URINARY TRACT ULTRASOUND COMPLETE COMPARISON:  None Available. FINDINGS: Right Kidney: Renal measurements: 10.8 x 4.5 x 5.2 cm = volume: 131 mL. Echogenicity within normal limits. No mass or hydronephrosis visualized. Left Kidney: Renal measurements: 11.2 x 5.2 x 5.0 cm = volume: 150 mL. Echogenicity within normal limits. No mass or hydronephrosis visualized. Limited definition of both kidneys by ultrasound due to body habitus. Bladder: Appears normal for degree of bladder distention. Other: None. IMPRESSION: Unremarkable renal ultrasound. Electronically Signed   By: Marcey Moan M.D.   On: 01/29/2024 16:06     Echo preserved left ventricular function EF around 60%  TELEMETRY: Sinus tachycardia rate of around 90:  ASSESSMENT AND PLAN:  Principal Problem:   Sepsis due to cellulitis [facility]) Active Problems:   Essential (primary) hypertension  Type 2 diabetes mellitus (HCC)   Chronic venous insufficiency   HLD (hyperlipidemia)   Morbid obesity with BMI of 50.0-59.9, adult (HCC)   OSA (obstructive sleep apnea)   Obesity hypoventilation syndrome (HCC)   (HFpEF) heart failure with preserved ejection fraction (HCC)   Bacteremia due to Pseudomonas   CO2 narcosis   Metabolic encephalopathy   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[place] Atrial fibrillation/SVT  Plan Continue antibiotic therapy for cellulitis Sleep  study CPAP recommend sleep study provide CPAP or BiPAP Anticoagulation for atrial fibrillation amiodarone for rhythm rate control Morbid obesity recommend weight loss exercise portion control COPD agree with inhalers as necessary supplemental oxygen as necessary Hypertension continue current therapy for blood pressure management and control Heart failure volume overload continue Lasix  therapy to help with diuresis Acute on chronic renal insufficiency agree with nephrology input for renal function management   Kelly JONETTA Lovelace, MD, 01/31/2024 9:58 AM

## 2024-02-01 DIAGNOSIS — N179 Acute kidney failure, unspecified: Secondary | ICD-10-CM

## 2024-02-01 DIAGNOSIS — R0689 Other abnormalities of breathing: Secondary | ICD-10-CM | POA: Diagnosis not present

## 2024-02-01 DIAGNOSIS — N189 Chronic kidney disease, unspecified: Secondary | ICD-10-CM

## 2024-02-01 DIAGNOSIS — R7881 Bacteremia: Secondary | ICD-10-CM | POA: Diagnosis not present

## 2024-02-01 DIAGNOSIS — B965 Pseudomonas (aeruginosa) (mallei) (pseudomallei) as the cause of diseases classified elsewhere: Secondary | ICD-10-CM | POA: Diagnosis not present

## 2024-02-01 DIAGNOSIS — I890 Lymphedema, not elsewhere classified: Secondary | ICD-10-CM | POA: Diagnosis not present

## 2024-02-01 DIAGNOSIS — G9341 Metabolic encephalopathy: Secondary | ICD-10-CM | POA: Diagnosis not present

## 2024-02-01 DIAGNOSIS — J9622 Acute and chronic respiratory failure with hypercapnia: Secondary | ICD-10-CM

## 2024-02-01 LAB — GLUCOSE, CAPILLARY
Glucose-Capillary: 103 mg/dL — ABNORMAL HIGH (ref 70–99)
Glucose-Capillary: 135 mg/dL — ABNORMAL HIGH (ref 70–99)
Glucose-Capillary: 106 mg/dL — ABNORMAL HIGH (ref 70–99)
Glucose-Capillary: 123 mg/dL — ABNORMAL HIGH (ref 70–99)

## 2024-02-01 LAB — BASIC METABOLIC PANEL WITH GFR
Anion gap: 9 (ref 5–15)
BUN: 62 mg/dL — ABNORMAL HIGH (ref 8–23)
CO2: 40 mmol/L — ABNORMAL HIGH (ref 22–32)
Calcium: 10.3 mg/dL (ref 8.9–10.3)
Chloride: 93 mmol/L — ABNORMAL LOW (ref 98–111)
Creatinine, Ser: 2.15 mg/dL — ABNORMAL HIGH (ref 0.44–1.00)
GFR, Estimated: 22 mL/min — ABNORMAL LOW (ref >60)
Glucose, Bld: 94 mg/dL (ref 70–99)
Potassium: 4.2 mmol/L (ref 3.5–5.1)
Sodium: 141 mmol/L (ref 135–145)

## 2024-02-01 MED ORDER — AMLODIPINE BESYLATE 5 MG PO TABS
5.0000 mg | ORAL_TABLET | Freq: Every day | ORAL | Status: DC
  Administered 2024-02-01 – 2024-02-05 (×5): 5 mg via ORAL
  Filled 2024-02-01 (×5): qty 1

## 2024-02-01 MED ORDER — SENNOSIDES-DOCUSATE SODIUM 8.6-50 MG PO TABS
2.0000 | ORAL_TABLET | Freq: Once | ORAL | Status: AC
  Administered 2024-02-01: 2 via ORAL
  Filled 2024-02-01: qty 2

## 2024-02-01 NOTE — Progress Notes (Addendum)
 [facility]  ROUNDING NOTE   Subjective:   Patient sitting up in bed Son at bedside Alert and oriented Complaining of right shoulder pain 4L [place]  UOP Creatinine 2.15  Objective:  Vital signs in last 24 hours:  Temp:  [97.5 F (36.4 C)-98.2 F (36.8 C)] 97.5 F (36.4 C) (11/17 1227) Pulse Rate:  [68-80] 79 (11/17 1227) Resp:  [12-20] 12 (11/17 0400) BP: (146-158)/(64-75) 151/75 (11/17 1227) SpO2:  [90 %-98 %] 98 % (11/17 0800) Weight:  [130.4 kg] 130.4 kg (11/17 0500)  Weight change: -0.2 kg Filed Weights   01/30/24 0500 01/31/24 0430 02/01/24 0500  Weight: (!) 142 kg 130.6 kg 130.4 kg    Intake/Output: I/O last 3 completed shifts: In: 900 [P.O.:900] Out: 3600 [Urine:3600]   Intake/Output this shift:  Total I/O In: 480 [P.O.:480] Out: -   Physical Exam: General: NAD  Head: Normocephalic, atraumatic. Moist oral mucosal membranes  Eyes: Anicteric  Lungs:  Clear to auscultation, 4L [place]  Heart: Regular rate and rhythm  Abdomen:  Soft, nontender  Extremities:  No peripheral edema.  Neurologic: Awake, alert, conversant  Skin: Warm,dry, no rash  Access: None     Basic Metabolic Panel: Recent Labs  Lab 01/26/24 1400 01/27/24 0456 01/28/24 0409 01/29/24 0438 01/30/24 0411 01/31/24 0557 02/01/24 0306  NA 139   < > 136 139 142 144 141  K 4.7   < > 4.9 4.7 4.4 4.2 4.2  CL 102   < > 99 99 99 97* 93*  CO2 24   < > 27 27 29  35* 40*  GLUCOSE 133*   < > 92 85 65* 83 94  BUN 54*   < > 63* 65* 64* 65* 62*  CREATININE 2.03*   < > 2.78* 2.86* 2.79* 2.32* 2.15*  CALCIUM  9.1   < > 8.9 9.7 10.1 10.3 10.3  MG 2.2  --   --   --   --   --   --    < > = values in this interval not displayed.    Liver Function Tests: Recent Labs  Lab 01/26/24 1400  AST 16  ALT 8  ALKPHOS 45  BILITOT 0.4  PROT 6.8  ALBUMIN 3.8   No results for input(s): LIPASE, AMYLASE in the last 168 hours. No results for input(s): AMMONIA in the last 168  hours.  CBC: Recent Labs  Lab 01/26/24 1256 01/27/24 0456 01/28/24 0409 01/29/24 0438 01/30/24 0411  WBC 8.5 24.0* 21.6* 16.8* 11.3*  NEUTROABS 7.9*  --   --   --   --   HGB 10.6* 8.7* 8.1* 8.2* 9.0*  HCT 35.0* 29.4* 27.3* 28.0* 28.7*  MCV 93.1 96.4 96.5 95.2 91.4  PLT 325 249 232 248 256    Cardiac Enzymes: No results for input(s): CKTOTAL, CKMB, CKMBINDEX, TROPONINI in the last 168 hours.  BNP: Invalid input(s): POCBNP  CBG: Recent Labs  Lab 01/31/24 1141 01/31/24 1752 01/31/24 2030 02/01/24 0759 02/01/24 1226  GLUCAP 107* 94 105* 103* 135*    Microbiology: Results for orders placed or performed during the hospital encounter of 01/26/24  Resp panel by RT-PCR (RSV, Flu A&B, Covid) Anterior Nasal Swab     Status: None   Collection Time: 01/26/24 12:56 PM   Specimen: Anterior Nasal Swab  Result Value Ref Range Status   SARS Coronavirus 2 by RT PCR NEGATIVE NEGATIVE Final    Comment: (NOTE) SARS-CoV-2 target nucleic acids are NOT DETECTED.  The SARS-CoV-2 RNA  is generally detectable in upper respiratory specimens during the acute phase of infection. The lowest concentration of SARS-CoV-2 viral copies this assay can detect is 138 copies/mL. A negative result does not preclude SARS-Cov-2 infection and should not be used as the sole basis for treatment or other patient management decisions. A negative result may occur with  improper specimen collection/handling, submission of specimen other than nasopharyngeal swab, presence of viral mutation(s) within the areas targeted by this assay, and inadequate number of viral copies(<138 copies/mL). A negative result must be combined with clinical observations, patient history, and epidemiological information. The expected result is Negative.  Fact Sheet for Patients:  bloggercourse.com  Fact Sheet for Healthcare Providers:  seriousbroker.it  This test is no t  yet approved or cleared by the [place]  FDA and  has been authorized for detection and/or diagnosis of SARS-CoV-2 by FDA under an Emergency Use Authorization (EUA). This EUA will remain  in effect (meaning this test can be used) for the duration of the COVID-19 declaration under Section 564(b)(1) of the Act, 21 U.S.C.section 360bbb-3(b)(1), unless the authorization is terminated  or revoked sooner.       Influenza A by PCR NEGATIVE NEGATIVE Final   Influenza B by PCR NEGATIVE NEGATIVE Final    Comment: (NOTE) The Xpert Xpress SARS-CoV-2/FLU/RSV plus assay is intended as an aid in the diagnosis of influenza from Nasopharyngeal swab specimens and should not be used as a sole basis for treatment. Nasal washings and aspirates are unacceptable for Xpert Xpress SARS-CoV-2/FLU/RSV testing.  Fact Sheet for Patients: bloggercourse.com  Fact Sheet for Healthcare Providers: seriousbroker.it  This test is not yet approved or cleared by the [place]  FDA and has been authorized for detection and/or diagnosis of SARS-CoV-2 by FDA under an Emergency Use Authorization (EUA). This EUA will remain in effect (meaning this test can be used) for the duration of the COVID-19 declaration under Section 564(b)(1) of the Act, 21 U.S.C. section 360bbb-3(b)(1), unless the authorization is terminated or revoked.     Resp Syncytial Virus by PCR NEGATIVE NEGATIVE Final    Comment: (NOTE) Fact Sheet for Patients: bloggercourse.com  Fact Sheet for Healthcare Providers: seriousbroker.it  This test is not yet approved or cleared by the [place]  FDA and has been authorized for detection and/or diagnosis of SARS-CoV-2 by FDA under an Emergency Use Authorization (EUA). This EUA will remain in effect (meaning this test can be used) for the duration of the COVID-19 declaration under Section 564(b)(1)  of the Act, 21 U.S.C. section 360bbb-3(b)(1), unless the authorization is terminated or revoked.  Performed at [facility], [address], [place], [place] [address]   Blood Culture (routine x 2)     Status: Abnormal   Collection Time: 01/26/24 12:56 PM   Specimen: BLOOD  Result Value Ref Range Status   Specimen Description   Final    BLOOD BLOOD RIGHT FOREARM Performed at [facility], [address] [place], [place] [address]    Special Requests   Final    BOTTLES DRAWN AEROBIC AND ANAEROBIC Blood Culture results may not be optimal due to an inadequate volume of blood received in culture bottles Performed at [facility], [address] [place], [place] [address]    Culture  Setup Time   Final    GRAM NEGATIVE RODS AEROBIC BOTTLE ONLY CRITICAL VALUE NOTED.  VALUE IS CONSISTENT WITH PREVIOUSLY REPORTED AND CALLED VALUE. Performed at [facility], [address] [place], [place] [address]  Culture (A)  Final    PSEUDOMONAS AERUGINOSA SUSCEPTIBILITIES PERFORMED ON PREVIOUS CULTURE WITHIN THE LAST 5 DAYS. Performed at [facility], [address] [place], [place] [address]    Report Status 01/29/2024 FINAL  Final  Blood Culture (routine x 2)     Status: Abnormal   Collection Time: 01/26/24  1:01 PM   Specimen: BLOOD  Result Value Ref Range Status   Specimen Description   Final    BLOOD RIGHT ARM Performed at [facility], [address], [place], [place] [address]    Special Requests   Final    BOTTLES DRAWN AEROBIC AND ANAEROBIC Blood Culture adequate volume Performed at [facility], [address], [place], [place] [address]    Culture  Setup Time   Final    GRAM NEGATIVE RODS AEROBIC BOTTLE ONLY CRITICAL RESULT CALLED TO, READ BACK BY AND VERIFIED WITH:  NATHAN BELUE AT 0541 01/27/24 JG Performed at [facility], [address] [place], [place] [address]    Culture PSEUDOMONAS  AERUGINOSA (A)  Final   Report Status 01/29/2024 FINAL  Final   Organism ID, Bacteria PSEUDOMONAS AERUGINOSA  Final      Susceptibility   Pseudomonas aeruginosa - MIC*    MEROPENEM INTERMEDIATE Intermediate     CIPROFLOXACIN 0.5 SENSITIVE Sensitive     IMIPENEM >=16 RESISTANT Resistant     PIP/TAZO Value in next row Sensitive      8 SENSITIVEThis is a modified FDA-approved test that has been validated and its performance characteristics determined by the reporting laboratory.  This laboratory is certified under the Clinical Laboratory Improvement Amendments CLIA as qualified to perform high complexity clinical laboratory testing.    CEFEPIME Value in next row Sensitive      8 SENSITIVEThis is a modified FDA-approved test that has been validated and its performance characteristics determined by the reporting laboratory.  This laboratory is certified under the Clinical Laboratory Improvement Amendments CLIA as qualified to perform high complexity clinical laboratory testing.    CEFTAZIDIME/AVIBACTAM Value in next row Sensitive      8 SENSITIVEThis is a modified FDA-approved test that has been validated and its performance characteristics determined by the reporting laboratory.  This laboratory is certified under the Clinical Laboratory Improvement Amendments CLIA as qualified to perform high complexity clinical laboratory testing.    CEFTOLOZANE/TAZOBACTAM Value in next row Sensitive      8 SENSITIVEThis is a modified FDA-approved test that has been validated and its performance characteristics determined by the reporting laboratory.  This laboratory is certified under the Clinical Laboratory Improvement Amendments CLIA as qualified to perform high complexity clinical laboratory testing.    TOBRAMYCIN Value in next row Sensitive      8 SENSITIVEThis is a modified FDA-approved test that has been validated and its performance characteristics determined by the reporting laboratory.  This laboratory is  certified under the Clinical Laboratory Improvement Amendments CLIA as qualified to perform high complexity clinical laboratory testing.    CEFTAZIDIME Value in next row Sensitive      8 SENSITIVEThis is a modified FDA-approved test that has been validated and its performance characteristics determined by the reporting laboratory.  This laboratory is certified under the Clinical Laboratory Improvement Amendments CLIA as qualified to perform high complexity clinical laboratory testing.    * PSEUDOMONAS AERUGINOSA  Blood Culture ID Panel (Reflexed)     Status: Abnormal   Collection Time: 01/26/24  1:01 PM  Result Value Ref  Range Status   Enterococcus faecalis NOT DETECTED NOT DETECTED Final   Enterococcus Faecium NOT DETECTED NOT DETECTED Final   Listeria monocytogenes NOT DETECTED NOT DETECTED Final   Staphylococcus species NOT DETECTED NOT DETECTED Final   Staphylococcus aureus (BCID) NOT DETECTED NOT DETECTED Final   Staphylococcus epidermidis NOT DETECTED NOT DETECTED Final   Staphylococcus lugdunensis NOT DETECTED NOT DETECTED Final   Streptococcus species NOT DETECTED NOT DETECTED Final   Streptococcus agalactiae NOT DETECTED NOT DETECTED Final   Streptococcus pneumoniae NOT DETECTED NOT DETECTED Final   Streptococcus pyogenes NOT DETECTED NOT DETECTED Final   A.calcoaceticus-baumannii NOT DETECTED NOT DETECTED Final   Bacteroides fragilis NOT DETECTED NOT DETECTED Final   Enterobacterales NOT DETECTED NOT DETECTED Final   Enterobacter cloacae complex NOT DETECTED NOT DETECTED Final   Escherichia coli NOT DETECTED NOT DETECTED Final   Klebsiella aerogenes NOT DETECTED NOT DETECTED Final   Klebsiella oxytoca NOT DETECTED NOT DETECTED Final   Klebsiella pneumoniae NOT DETECTED NOT DETECTED Final   Proteus species NOT DETECTED NOT DETECTED Final   Salmonella species NOT DETECTED NOT DETECTED Final   Serratia marcescens NOT DETECTED NOT DETECTED Final   Haemophilus influenzae NOT  DETECTED NOT DETECTED Final   Neisseria meningitidis NOT DETECTED NOT DETECTED Final   Pseudomonas aeruginosa DETECTED (A) NOT DETECTED Final    Comment: CRITICAL RESULT CALLED TO, READ BACK BY AND VERIFIED WITH:  NATHAN BELUE AT 0541 01/27/24 JG    Stenotrophomonas maltophilia NOT DETECTED NOT DETECTED Final   Candida albicans NOT DETECTED NOT DETECTED Final   Candida auris NOT DETECTED NOT DETECTED Final   Candida glabrata NOT DETECTED NOT DETECTED Final   Candida krusei NOT DETECTED NOT DETECTED Final   Candida parapsilosis NOT DETECTED NOT DETECTED Final   Candida tropicalis NOT DETECTED NOT DETECTED Final   Cryptococcus neoformans/gattii NOT DETECTED NOT DETECTED Final   CTX-M ESBL NOT DETECTED NOT DETECTED Final   Carbapenem resistance IMP NOT DETECTED NOT DETECTED Final   Carbapenem resistance KPC NOT DETECTED NOT DETECTED Final   Carbapenem resistance NDM NOT DETECTED NOT DETECTED Final   Carbapenem resistance VIM NOT DETECTED NOT DETECTED Final    Comment: Performed at [facility], [address] [place], [place] [address]  Culture, blood (Routine X 2) w Reflex to ID Panel     Status: None (Preliminary result)   Collection Time: 01/29/24  4:38 AM   Specimen: BLOOD  Result Value Ref Range Status   Specimen Description BLOOD BLOOD LEFT ARM  Final   Special Requests   Final    BOTTLES DRAWN AEROBIC AND ANAEROBIC Blood Culture adequate volume   Culture   Final    NO GROWTH 3 DAYS Performed at [facility], [address] [place], [place] [address]    Report Status PENDING  Incomplete  Culture, blood (Routine X 2) w Reflex to ID Panel     Status: None (Preliminary result)   Collection Time: 01/29/24  4:43 AM   Specimen: BLOOD  Result Value Ref Range Status   Specimen Description BLOOD BLOOD LEFT HAND  Final   Special Requests   Final    BOTTLES DRAWN AEROBIC AND ANAEROBIC Blood Culture adequate volume   Culture   Final    NO GROWTH 3 DAYS Performed  at [facility], [address] [place], [place] [address]    Report Status PENDING  Incomplete    Coagulation Studies: No results for input(s): LABPROT, INR in the last  72 hours.   Urinalysis: No results for input(s): COLORURINE, LABSPEC, PHURINE, GLUCOSEU, HGBUR, BILIRUBINUR, KETONESUR, PROTEINUR, UROBILINOGEN, NITRITE, LEUKOCYTESUR in the last 72 hours.  Invalid input(s): APPERANCEUR     Imaging: No results found.    Medications:    cefTAZidime (FORTAZ)  IV 2 g (02/01/24 1016)    amiodarone  400 mg Oral BID   Followed by   [name] ON 02/05/2024] amiodarone  200 mg Oral Daily   amLODipine  5 mg Oral Daily   apixaban  2.5 mg Oral BID   budesonide-glycopyrrolate-formoterol  2 puff Inhalation BID   furosemide   60 mg Intravenous BID   lactose free nutrition  237 mL Oral TID WC   pantoprazole   40 mg Oral Daily   polyethylene glycol  17 g Oral Daily   pravastatin  20 mg Oral QPM   senna-docusate  2 tablet Oral Once   sodium chloride  flush  3 mL Intravenous Q12H   acetaminophen  **OR** acetaminophen , bisacodyl , dextrose, HYDROmorphone (DILAUDID) injection, ondansetron  (ZOFRAN ) IV, senna-docusate  Assessment/ Plan:  Kelly Hogan is a 83 y.o.  female with  past medical history including hyperlipidemia, type 2 diabetes, hypertension, obesity, and COPD, who was admitted to [facility] on 01/26/2024 for SVT (supraventricular tachycardia) [I47.10] Cellulitis of left lower extremity [L03.116] Sepsis due to cellulitis (HCC) [L03.90, A41.9] Sepsis, due to unspecified organism, unspecified whether acute organ dysfunction present (HCC) [A41.9]   Acute kidney injury with mild proteinuria on chronic kidney disease stage IV. Baseline creatinine appears to be 1.83 with GFR 27 on December 08, 2023. Suspect there may be a cardiorenal component. BNP elevated. Renal US  unremarkable.  Creatinine has improved. Urine output adequate. Will need to follow  up in our office at discharge.   Lab Results  Component Value Date   CREATININE 2.15 (H) 02/01/2024   CREATININE 2.32 (H) 01/31/2024   CREATININE 2.79 (H) 01/30/2024    Intake/Output Summary (Last 24 hours) at 02/01/2024 1411 Last data filed at 02/01/2024 0900 Gross per 24 hour  Intake 1260 ml  Output 1800 ml  Net -540 ml   2. Anemia of chronic kidney disease Lab Results  Component Value Date   HGB 9.0 (L) 01/30/2024    Hgb borderline. Will continue to monitor for need of ESA.   3. Mild hyperkalemia  Potassium 4.2   4.  Hypotension with history of hypertension.  Likely secondary to infectious process.  Antihypertensives initially held, now receiving amlodipine and furosemide .   Due to renal recovery, we will sign off at this time. Will schedule appt in our office at discharge.    LOS: 6 Kelly Hogan 11/17/20252:11 PM

## 2024-02-01 NOTE — Progress Notes (Signed)
 PROGRESS NOTE    Kelly Hogan   FMW:[id number] DOB: 1940-04-19  DOA: 01/26/2024 Date of Service: 02/01/24 which is hospital day 6  PCP: [name] [name] LABOR, MD    Hospital course / significant events:   HPI: Kelly Hogan is a 83 y.o. 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    11/11: admitted to hospitalist w/ hypotension, cellulitis. Metronidazole and cefepime in the ED. SVT with rates of 140s, pt requiring amio gtt.   11/12: (+)Pseudomonas bacteremia - linezolid and vancomycin. Remains on amio gtt for now, cardiology will see in AM. Was off amio briefly this evening d/t limited IV access and needed other Rx, HR was WNL but then up again and back on the drip. Renal fxn worse, resp acidosis and hypercarbia, pt placed on BiPAP. Fluids changed to bicarb infusion 1L. Hyperkalemia, mild, gave K shifting Rx w/ insulin/D50, also admin Lokelma.  11/13: renal fxn continuing to worsen - Nephrology consulted. Pt remains on BiPAP this morning. Was off briefly per her request but O2 down and SOB so placed back on the BiPAP. BP soft, gave another 1L fluids. Cardiology - transition to po amio, starting eliquis. ID recs change ceftazidime d/t myoclonus effects and confusion. Still worsening into the afternoon, CXR and BNP indicative of HFpEF w/ pulmonary edema despite fairly clear lungs (but exam difficult). Son is ok for trial of diuretics despite renal fxn.  11/14: good UOP and respiratory is improved onto Plato O2, renal fxn about same. Continuing on ceftazidime. Changed morphine to dilaudid w/ renal fxn. Renal US  no concerns. Cr 2.86. BCx repeated. Requiring BiPAP at night  11/15: somnolent but alerts to voice. Has not wanted to sit up in bed/chair. Cr  2.79. UOP yesterday looks like under documented. Continue diuresis. Repeat BCx NG thus far.  11/16: Cr 2.32, up to chair today encouraging.  11/17: Cr 2.15, CO2 levels higher today on BMP, pt refusing BiPAP overnight, encouraged once again she needs to wear at night, needs to be up to chair as much as possible, incentive spirometer      Consultants:  Cardiology  Infectious disease   Procedures/Surgeries: none      ASSESSMENT & PLAN:   Cellulitis of the left lower extremity. Present on admission and ruled out DVT (though US  exam somewhat limited) (+)Pseudomonas bacteremia Dc cefepime d/t myoclonus / neuro effects. Continue on ceftazidime.  ID following  Monitor cultures --> initial cultures 11/11 sensitive to ceftazidime, repeat cultures 11/14 are negative thus far  trend leukocyte count and fever curve.   ID consult for bacteremia - likely cellulitis but eval lungs as well may need CT    HFpEF Exacerbation d/t SVT + fluid tx per sepsis protocol  Echo noted preserved EF but unable to assess diastolic fxn, likely is dysfunctional. No significant valve dysfunction.  Diuresing Lasix  IV 60 mg bid  Strict I&O Careful monitoring BMP / renal fxn --> improving  Cardiology following   SVT:  Amiodarone po Eliquis 2.5 mg bid  Cardiology to follow  Echo as above       Acte on Chronic hypoxic/hypercarbic respiratory failure secondary to COPD and OSA and HFpEF, suspect restrictive disease / obesity hypoventilation syndrome Resp support was escalated to BIPAP and now weaned  down to [place] but requiring BiPAP at night Pt has been resistant to sitting upright to allow better chest expansion but was able to sit in chair awhile today  Incentive spirometry encouraged   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   Midodrine dc today hold antihypertensives. Cardiology following    Type 2 diabetes Monitor glc Relatively low glc, will hold on SSI for now   Hyperlipidemia:  statin   Chronic venous stasis Rec follow w/ lymphedema clinic   Skin nodularity bilateral lower extremities Question hyperkeratotic lesions? Keloid type lesions?  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 Discussed I support no CPR / intubation, should she be weak enough to experience cardiopulmonary arrest. 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  Underweight - under 18  overweight - 25 to 29 obese - 30 or more Class 1 obesity: BMI of 30.0 to 34 Class 2 obesity: BMI of 35.0 to 39 Class 3 obesity: BMI of 40.0 to 49 Super Morbid Obesity: BMI 50-59 Super-super Morbid Obesity: BMI 60+ Healthy nutrition and physical activity advised as adjunct to other disease management and risk reduction treatments    DVT prophylaxis: lovenox  IV  fluids:dc Nutrition: dysphagia diet for now  Unumprovident / other devices: none  Code Status: DNR per MOST form which was completed by the patient  ACP documentation reviewed: none on file in [facility]  [facility] needs: SNF  Medical barriers to dispo: IV abx and bacteremia. Hypercarbia requiring BiPAP. Expected medical readiness for discharge several days at least.              Subjective / Brief ROS:  Patient reports dry mouth States she doesn't want to wear the BiPAP  Awakens to voice, minimal responses  Denies CP Reports feels about same as yesterday   Family Communication: son at bedside on roudns this morning        Objective Findings:  Vitals:   02/01/24 0400 02/01/24 0500 02/01/24 0800 02/01/24 1227  BP: (!) 158/74  (!) 148/64 (!) 151/75  Pulse: 74  68 79  Resp: 12     Temp: 98.2 F (36.8 C)  98.1 F (36.7 C) (!) 97.5 F (36.4 C)  TempSrc: Oral  Axillary Oral  SpO2: 94%  98%   Weight:  130.4 kg    Height:  Intake/Output Summary (Last 24 hours) at 02/01/2024 1539 Last data filed at 02/01/2024 0900 Gross per 24 hour  Intake 1260 ml  Output 1800 ml  Net -540 ml   Filed Weights   01/30/24 0500 01/31/24 0430 02/01/24 0500  Weight: (!) 142 kg 130.6 kg 130.4 kg    Examination:  Physical Exam Constitutional:      General: She is not in acute distress.    Appearance: She is ill-appearing.     Comments: Alerts to voice but groggy and giving one-word answers  Cardiovascular:     Rate and Rhythm: Normal rate and regular rhythm.  Pulmonary:     Effort: Tachypnea present. No respiratory distress.     Breath sounds: Decreased air movement present.  Musculoskeletal:     Right lower leg: Edema present.     Left lower leg: Edema present.  Skin:    Comments: See photos  Neurological:     Mental Status: She is alert and oriented to person, place, and time. Mental status is at baseline.  Psychiatric:        Mood and Affect: Mood normal.         Behavior: Behavior normal.       01/27/24 - erythema improved     01/26/24     Scheduled Medications:   amiodarone  400 mg Oral BID   Followed by   [name] ON 02/05/2024] amiodarone  200 mg Oral Daily   amLODipine  5 mg Oral Daily   apixaban  2.5 mg Oral BID   budesonide-glycopyrrolate-formoterol  2 puff Inhalation BID   furosemide   60 mg Intravenous BID   lactose free nutrition  237 mL Oral TID WC   pantoprazole   40 mg Oral Daily   polyethylene glycol  17 g Oral Daily   pravastatin  20 mg Oral QPM   senna-docusate  2 tablet Oral Once   sodium chloride  flush  3 mL Intravenous Q12H    Continuous Infusions:  cefTAZidime (FORTAZ)  IV 2 g (02/01/24 1016)    PRN Medications:  acetaminophen  **OR** acetaminophen , bisacodyl , dextrose, HYDROmorphone (DILAUDID) injection, ondansetron  (ZOFRAN ) IV, senna-docusate  Antimicrobials from admission:  Anti-infectives (From admission, onward)    Start     Dose/Rate Route Frequency Ordered Stop   01/29/24 1000  cefTAZidime (FORTAZ) 2 g in sodium chloride  0.9 % 100 mL IVPB        2 g 200 mL/hr over 30 Minutes Intravenous Every 24 hours 01/28/24 1651 02/07/24 2359   01/28/24 1600  vancomycin (VANCOCIN) IVPB 1000 mg/200 mL premix  Status:  Discontinued       Placed in Followed by Linked Group   1,000 mg 200 mL/hr over 60 Minutes Intravenous Every 36 hours 01/27/24 0241 01/27/24 1045   01/27/24 2200  linezolid (ZYVOX) IVPB 600 mg  Status:  Discontinued        600 mg 300 mL/hr over 60 Minutes Intravenous Every 12 hours 01/27/24 1046 01/28/24 1618   01/27/24 1200  ceFEPIme (MAXIPIME) 2 g in sodium chloride  0.9 % 100 mL IVPB  Status:  Discontinued        2 g 200 mL/hr over 30 Minutes Intravenous Every 24 hours 01/27/24 0228 01/28/24 1651   01/27/24 0400  vancomycin (VANCOREADY) IVPB 2000 mg/400 mL       Placed in Followed by Linked Group   2,000 mg 200 mL/hr over 120 Minutes Intravenous  Once 01/27/24 0241 01/27/24 0713   01/27/24  0100  ceFAZolin (ANCEF) IVPB 2g/100  mL premix  Status:  Discontinued        2 g 200 mL/hr over 30 Minutes Intravenous Every 8 hours 01/27/24 0037 01/27/24 0042   01/26/24 1300  ceFEPIme (MAXIPIME) 2 g in sodium chloride  0.9 % 100 mL IVPB        2 g 200 mL/hr over 30 Minutes Intravenous  Once 01/26/24 1256 01/26/24 1350   01/26/24 1300  metroNIDAZOLE (FLAGYL) IVPB 500 mg        500 mg 100 mL/hr over 60 Minutes Intravenous  Once 01/26/24 1256 01/26/24 1559   01/26/24 1300  vancomycin (VANCOCIN) IVPB 1000 mg/200 mL premix        1,000 mg 200 mL/hr over 60 Minutes Intravenous  Once 01/26/24 1256 01/26/24 1458           Data Reviewed:  I have personally reviewed the following...  CBC: Recent Labs  Lab 01/26/24 1256 01/27/24 0456 01/28/24 0409 01/29/24 0438 01/30/24 0411  WBC 8.5 24.0* 21.6* 16.8* 11.3*  NEUTROABS 7.9*  --   --   --   --   HGB 10.6* 8.7* 8.1* 8.2* 9.0*  HCT 35.0* 29.4* 27.3* 28.0* 28.7*  MCV 93.1 96.4 96.5 95.2 91.4  PLT 325 249 232 248 256   Basic Metabolic Panel: Recent Labs  Lab 01/26/24 1400 01/27/24 0456 01/28/24 0409 01/29/24 0438 01/30/24 0411 01/31/24 0557 02/01/24 0306  NA 139   < > 136 139 142 144 141  K 4.7   < > 4.9 4.7 4.4 4.2 4.2  CL 102   < > 99 99 99 97* 93*  CO2 24   < > 27 27 29  35* 40*  GLUCOSE 133*   < > 92 85 65* 83 94  BUN 54*   < > 63* 65* 64* 65* 62*  CREATININE 2.03*   < > 2.78* 2.86* 2.79* 2.32* 2.15*  CALCIUM  9.1   < > 8.9 9.7 10.1 10.3 10.3  MG 2.2  --   --   --   --   --   --    < > = values in this interval not displayed.   GFR: Estimated Creatinine Clearance: 27.1 mL/min (A) (by C-G formula based on SCr of 2.15 mg/dL (H)). Liver Function Tests: Recent Labs  Lab 01/26/24 1400  AST 16  ALT 8  ALKPHOS 45  BILITOT 0.4  PROT 6.8  ALBUMIN 3.8   No results for input(s): LIPASE, AMYLASE in the last 168 hours. No results for input(s): AMMONIA in the last 168 hours. Coagulation Profile: Recent Labs  Lab  01/26/24 1256  INR 1.0   Cardiac Enzymes: No results for input(s): CKTOTAL, CKMB, CKMBINDEX, TROPONINI in the last 168 hours. BNP (last 3 results) Recent Labs    01/26/24 1256 01/28/24 0409  PROBNP 121.0 9,777.0*   HbA1C: No results for input(s): HGBA1C in the last 72 hours. CBG: Recent Labs  Lab 01/31/24 1141 01/31/24 1752 01/31/24 2030 02/01/24 0759 02/01/24 1226  GLUCAP 107* 94 105* 103* 135*   Lipid Profile: No results for input(s): CHOL, HDL, LDLCALC, TRIG, CHOLHDL, LDLDIRECT in the last 72 hours. Thyroid Function Tests: No results for input(s): TSH, T4TOTAL, FREET4, T3FREE, THYROIDAB in the last 72 hours.  Anemia Panel: No results for input(s): VITAMINB12, FOLATE, FERRITIN, TIBC, IRON, RETICCTPCT in the last 72 hours. Most Recent Urinalysis On File:     Component Value Date/Time   COLORURINE STRAW (A) 01/26/2024 1430   APPEARANCEUR CLEAR (A) 01/26/2024 1430   APPEARANCEUR Cloudy  10/14/2012 2100   LABSPEC 1.009 01/26/2024 1430   LABSPEC 1.016 10/14/2012 2100   PHURINE 5.0 01/26/2024 1430   GLUCOSEU 150 (A) 01/26/2024 1430   GLUCOSEU Negative 10/14/2012 2100   HGBUR NEGATIVE 01/26/2024 1430   BILIRUBINUR NEGATIVE 01/26/2024 1430   BILIRUBINUR Negative 10/14/2012 2100   KETONESUR NEGATIVE 01/26/2024 1430   PROTEINUR 100 (A) 01/26/2024 1430   NITRITE NEGATIVE 01/26/2024 1430   LEUKOCYTESUR NEGATIVE 01/26/2024 1430   LEUKOCYTESUR Negative 10/14/2012 2100   Sepsis Labs: @LABRCNTIP (procalcitonin:4,lacticidven:4) Microbiology: Recent Results (from the past 240 hours)  Resp panel by RT-PCR (RSV, Flu A&B, Covid) Anterior Nasal Swab     Status: None   Collection Time: 01/26/24 12:56 PM   Specimen: Anterior Nasal Swab  Result Value Ref Range Status   SARS Coronavirus 2 by RT PCR NEGATIVE NEGATIVE Final    Comment: (NOTE) SARS-CoV-2 target nucleic acids are NOT DETECTED.  The SARS-CoV-2 RNA is generally detectable in  upper respiratory specimens during the acute phase of infection. The lowest concentration of SARS-CoV-2 viral copies this assay can detect is 138 copies/mL. A negative result does not preclude SARS-Cov-2 infection and should not be used as the sole basis for treatment or other patient management decisions. A negative result may occur with  improper specimen collection/handling, submission of specimen other than nasopharyngeal swab, presence of viral mutation(s) within the areas targeted by this assay, and inadequate number of viral copies(<138 copies/mL). A negative result must be combined with clinical observations, patient history, and epidemiological information. The expected result is Negative.  Fact Sheet for Patients:  bloggercourse.com  Fact Sheet for Healthcare Providers:  seriousbroker.it  This test is no t yet approved or cleared by the [place]  FDA and  has been authorized for detection and/or diagnosis of SARS-CoV-2 by FDA under an Emergency Use Authorization (EUA). This EUA will remain  in effect (meaning this test can be used) for the duration of the COVID-19 declaration under Section 564(b)(1) of the Act, 21 U.S.C.section 360bbb-3(b)(1), unless the authorization is terminated  or revoked sooner.       Influenza A by PCR NEGATIVE NEGATIVE Final   Influenza B by PCR NEGATIVE NEGATIVE Final    Comment: (NOTE) The Xpert Xpress SARS-CoV-2/FLU/RSV plus assay is intended as an aid in the diagnosis of influenza from Nasopharyngeal swab specimens and should not be used as a sole basis for treatment. Nasal washings and aspirates are unacceptable for Xpert Xpress SARS-CoV-2/FLU/RSV testing.  Fact Sheet for Patients: bloggercourse.com  Fact Sheet for Healthcare Providers: seriousbroker.it  This test is not yet approved or cleared by the [place]  FDA and has been  authorized for detection and/or diagnosis of SARS-CoV-2 by FDA under an Emergency Use Authorization (EUA). This EUA will remain in effect (meaning this test can be used) for the duration of the COVID-19 declaration under Section 564(b)(1) of the Act, 21 U.S.C. section 360bbb-3(b)(1), unless the authorization is terminated or revoked.     Resp Syncytial Virus by PCR NEGATIVE NEGATIVE Final    Comment: (NOTE) Fact Sheet for Patients: bloggercourse.com  Fact Sheet for Healthcare Providers: seriousbroker.it  This test is not yet approved or cleared by the [place]  FDA and has been authorized for detection and/or diagnosis of SARS-CoV-2 by FDA under an Emergency Use Authorization (EUA). This EUA will remain in effect (meaning this test can be used) for the duration of the COVID-19 declaration under Section 564(b)(1) of the Act, 21 U.S.C. section 360bbb-3(b)(1), unless the authorization is terminated or revoked.  Performed at [facility], [address] [place], [place] [address]   Blood Culture (routine x 2)     Status: Abnormal   Collection Time: 01/26/24 12:56 PM   Specimen: BLOOD  Result Value Ref Range Status   Specimen Description   Final    BLOOD BLOOD RIGHT FOREARM Performed at [facility], [address] [place], [place] [address]    Special Requests   Final    BOTTLES DRAWN AEROBIC AND ANAEROBIC Blood Culture results may not be optimal due to an inadequate volume of blood received in culture bottles Performed at [facility], [address], [place], [place] [address]    Culture  Setup Time   Final    GRAM NEGATIVE RODS AEROBIC BOTTLE ONLY CRITICAL VALUE NOTED.  VALUE IS CONSISTENT WITH PREVIOUSLY REPORTED AND CALLED VALUE. Performed at [facility], [address] [place], [place] [address]    Culture (A)  Final    PSEUDOMONAS AERUGINOSA SUSCEPTIBILITIES PERFORMED ON  PREVIOUS CULTURE WITHIN THE LAST 5 DAYS. Performed at [facility], [address], [place], [place] [address]    Report Status 01/29/2024 FINAL  Final  Blood Culture (routine x 2)     Status: Abnormal   Collection Time: 01/26/24  1:01 PM   Specimen: BLOOD  Result Value Ref Range Status   Specimen Description   Final    BLOOD RIGHT ARM Performed at [facility], [address], [place], [place] [address]    Special Requests   Final    BOTTLES DRAWN AEROBIC AND ANAEROBIC Blood Culture adequate volume Performed at [facility], [address] [place], [place] [address]    Culture  Setup Time   Final    GRAM NEGATIVE RODS AEROBIC BOTTLE ONLY CRITICAL RESULT CALLED TO, READ BACK BY AND VERIFIED WITH:  NATHAN BELUE AT 0541 01/27/24 JG Performed at [facility], [address] [place], [place] [address]    Culture PSEUDOMONAS AERUGINOSA (A)  Final   Report Status 01/29/2024 FINAL  Final   Organism ID, Bacteria PSEUDOMONAS AERUGINOSA  Final      Susceptibility   Pseudomonas aeruginosa - MIC*    MEROPENEM INTERMEDIATE Intermediate     CIPROFLOXACIN 0.5 SENSITIVE Sensitive     IMIPENEM >=16 RESISTANT Resistant     PIP/TAZO Value in next row Sensitive      8 SENSITIVEThis is a modified FDA-approved test that has been validated and its performance characteristics determined by the reporting laboratory.  This laboratory is certified under the Clinical Laboratory Improvement Amendments CLIA as qualified to perform high complexity clinical laboratory testing.    CEFEPIME Value in next row Sensitive      8 SENSITIVEThis is a modified FDA-approved test that has been validated and its performance characteristics determined by the reporting laboratory.  This laboratory is certified under the Clinical Laboratory Improvement Amendments CLIA as qualified to perform high complexity clinical laboratory testing.    CEFTAZIDIME/AVIBACTAM Value in next row Sensitive      8  SENSITIVEThis is a modified FDA-approved test that has been validated and its performance characteristics determined by the reporting laboratory.  This laboratory is certified under the Clinical Laboratory Improvement Amendments CLIA as qualified to perform high complexity clinical laboratory testing.    CEFTOLOZANE/TAZOBACTAM Value in next row Sensitive      8 SENSITIVEThis is a modified FDA-approved test that has been validated and its performance characteristics determined by the reporting laboratory.  This laboratory is certified under the Clinical Laboratory Improvement Amendments CLIA as qualified to perform high complexity clinical laboratory testing.    TOBRAMYCIN Value in next row Sensitive      8 SENSITIVEThis is a modified FDA-approved test that has been validated and its performance characteristics determined by the reporting laboratory.  This laboratory is certified under the Clinical Laboratory Improvement Amendments CLIA as qualified to perform high complexity clinical laboratory testing.    CEFTAZIDIME Value in next row Sensitive      8 SENSITIVEThis is a modified FDA-approved test that has been validated and its performance characteristics determined by the reporting laboratory.  This laboratory is certified under the Clinical Laboratory Improvement Amendments CLIA as qualified to perform high complexity clinical laboratory testing.    * PSEUDOMONAS AERUGINOSA  Blood Culture ID Panel (Reflexed)     Status: Abnormal   Collection Time: 01/26/24  1:01 PM  Result Value Ref Range Status   Enterococcus faecalis NOT DETECTED NOT DETECTED Final   Enterococcus Faecium NOT DETECTED NOT DETECTED Final   Listeria monocytogenes NOT DETECTED NOT DETECTED Final   Staphylococcus species NOT DETECTED NOT DETECTED Final   Staphylococcus aureus (BCID) NOT DETECTED NOT DETECTED Final   Staphylococcus epidermidis NOT DETECTED NOT DETECTED Final   Staphylococcus lugdunensis NOT DETECTED NOT DETECTED  Final   Streptococcus species NOT DETECTED NOT DETECTED Final   Streptococcus agalactiae NOT DETECTED NOT DETECTED Final   Streptococcus pneumoniae NOT DETECTED NOT DETECTED Final   Streptococcus pyogenes NOT DETECTED NOT DETECTED Final   A.calcoaceticus-baumannii NOT DETECTED NOT DETECTED Final   Bacteroides fragilis NOT DETECTED NOT DETECTED Final   Enterobacterales NOT DETECTED NOT DETECTED Final   Enterobacter cloacae complex NOT DETECTED NOT DETECTED Final   Escherichia coli NOT DETECTED NOT DETECTED Final   Klebsiella aerogenes NOT DETECTED NOT DETECTED Final   Klebsiella oxytoca NOT DETECTED NOT DETECTED Final   Klebsiella pneumoniae NOT DETECTED NOT DETECTED Final   Proteus species NOT DETECTED NOT DETECTED Final   Salmonella species NOT DETECTED NOT DETECTED Final   Serratia marcescens NOT DETECTED NOT DETECTED Final   Haemophilus influenzae NOT DETECTED NOT DETECTED Final   Neisseria meningitidis NOT DETECTED NOT DETECTED Final   Pseudomonas aeruginosa DETECTED (A) NOT DETECTED Final    Comment: CRITICAL RESULT CALLED TO, READ BACK BY AND VERIFIED WITH:  NATHAN BELUE AT 0541 01/27/24 JG    Stenotrophomonas maltophilia NOT DETECTED NOT DETECTED Final   Candida albicans NOT DETECTED NOT DETECTED Final   Candida auris NOT DETECTED NOT DETECTED Final   Candida glabrata NOT DETECTED NOT DETECTED Final   Candida krusei NOT DETECTED NOT DETECTED Final   Candida parapsilosis NOT DETECTED NOT DETECTED Final   Candida tropicalis NOT DETECTED NOT DETECTED Final   Cryptococcus neoformans/gattii NOT DETECTED NOT DETECTED Final   CTX-M ESBL NOT DETECTED NOT DETECTED Final   Carbapenem resistance IMP NOT DETECTED NOT DETECTED Final   Carbapenem resistance KPC NOT DETECTED NOT DETECTED Final   Carbapenem resistance NDM NOT DETECTED NOT DETECTED Final   Carbapenem resistance VIM NOT DETECTED NOT DETECTED Final    Comment: Performed at [facility], [address]  [place], [place] [address]  Culture, blood (Routine X 2) w Reflex to ID Panel     Status: None (Preliminary result)   Collection Time: 01/29/24  4:38 AM   Specimen: BLOOD  Result Value Ref Range Status   Specimen Description BLOOD BLOOD LEFT ARM  Final   Special Requests  Final    BOTTLES DRAWN AEROBIC AND ANAEROBIC Blood Culture adequate volume   Culture   Final    NO GROWTH 3 DAYS Performed at [facility], [address] [place], [place] [address]    Report Status PENDING  Incomplete  Culture, blood (Routine X 2) w Reflex to ID Panel     Status: None (Preliminary result)   Collection Time: 01/29/24  4:43 AM   Specimen: BLOOD  Result Value Ref Range Status   Specimen Description BLOOD BLOOD LEFT HAND  Final   Special Requests   Final    BOTTLES DRAWN AEROBIC AND ANAEROBIC Blood Culture adequate volume   Culture   Final    NO GROWTH 3 DAYS Performed at [facility], [address], [place], [place] [address]    Report Status PENDING  Incomplete      Radiology Studies last 3 days: US  RENAL Result Date: 01/29/2024 CLINICAL DATA:  Acute renal failure. EXAM: RENAL / URINARY TRACT ULTRASOUND COMPLETE COMPARISON:  None Available. FINDINGS: Right Kidney: Renal measurements: 10.8 x 4.5 x 5.2 cm = volume: 131 mL. Echogenicity within normal limits. No mass or hydronephrosis visualized. Left Kidney: Renal measurements: 11.2 x 5.2 x 5.0 cm = volume: 150 mL. Echogenicity within normal limits. No mass or hydronephrosis visualized. Limited definition of both kidneys by ultrasound due to body habitus. Bladder: Appears normal for degree of bladder distention. Other: None. IMPRESSION: Unremarkable renal ultrasound. Electronically Signed   By: Marcey Moan M.D.   On: 01/29/2024 16:06   DG Chest Port 1 View Result Date: 01/28/2024 CLINICAL DATA:  Shortness of breath EXAM: PORTABLE CHEST 1 VIEW COMPARISON:  Chest x-ray 01/26/2024 FINDINGS: Heart is enlarged. There central pulmonary  vascular congestion. There some minimal patchy opacities in the right mid lung. The costophrenic angles are clear. No pneumothorax or acute fracture. There are degenerative changes of the left shoulder. IMPRESSION: 1. Cardiomegaly with central pulmonary vascular congestion. 2. Minimal patchy opacities in the right mid lung may represent atelectasis or infection. Electronically Signed   By: Greig Pique M.D.   On: 01/28/2024 17:49        Laneta Blunt, DO [facility] 02/01/2024, 3:39 PM    Dictation software may have been used to generate the above note. Typos may occur and escape review in typed/dictated notes. Please contact Dr Blunt directly for clarity if needed.  Staff may message me via secure chat in Epic  but this may not receive an immediate response,  please page me for urgent matters!  If 7PM-7AM, please contact night coverage [email or web address]

## 2024-02-01 NOTE — Progress Notes (Signed)
 Date of Admission:  01/26/2024      ID: Kelly Hogan is a 83 y.o. female Principal Problem:   Sepsis due to cellulitis Kelly Hogan) Active Problems:   Essential (primary) hypertension   Type 2 diabetes mellitus (HCC)   Chronic venous insufficiency   HLD (hyperlipidemia)   Morbid obesity with BMI of 50.0-59.9, adult (HCC)   OSA (obstructive sleep apnea)   Obesity hypoventilation syndrome (HCC)   (HFpEF) heart failure with preserved ejection fraction (HCC)   Bacteremia due to Pseudomonas   CO2 narcosis   Metabolic encephalopathy   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nostras verrucosa  Patient's son and brother at bedside  Kelly Hogan is a 36 y.o. with a history of HFpEF, OSA, morbid obesity, HTN, DM, COPD on oxygen at home, chronic lymphedema legs presented to the ED brought in by EMS from home with fever and chills of 2 days  Subjective: Pt is alert  Says feeling better  Medications:   amiodarone  400 mg Oral BID   Followed by   [name] ON 02/05/2024] amiodarone  200 mg Oral Daily   apixaban  2.5 mg Oral BID   budesonide-glycopyrrolate-formoterol  2 puff Inhalation BID   furosemide   60 mg Intravenous BID   lactose free nutrition  237 mL Oral TID WC   pantoprazole   40 mg Oral Daily   polyethylene glycol  17 g Oral Daily   pravastatin  20 mg Oral QPM   sodium chloride  flush  3 mL Intravenous Q12H    Objective: Vital signs in last 24 hours: Patient Vitals for the past 24 hrs:  BP Temp Temp src Pulse Resp SpO2 Weight  02/01/24 0800 (!) 148/64 98.1 F (36.7 C) Axillary 68 -- 98 % --  02/01/24 0500 -- -- -- -- -- -- 130.4 kg  02/01/24 0400 (!) 158/74 98.2 F (36.8 C) Oral 74 12 94 % --  02/01/24 0000 (!) 146/64 98 F (36.7 C) Oral 80 20 96 % --  01/31/24 2000 (!) 156/66 98 F (36.7 C) Oral 78 -- 90 % --       PHYSICAL EXAM:  General: awake more alert No resp distress BiPAP Lungs: Bilateral air entry Heart: S1-S2 Abdomen: Soft, non-tender,not distended. Bowel sounds normal. No  masses Extremities: Picture reviewed     Skin: No rashes or lesions. Or bruising Lymph: Cervical, supraclavicular normal. Neurologic: Grossly non-focal  Lab Results    Latest Ref Rng & Units 01/30/2024    4:11 AM 01/29/2024    4:38 AM 01/28/2024    4:09 AM  CBC  WBC 4.0 - 10.5 K/uL 11.3  16.8  21.6   Hemoglobin 12.0 - 15.0 g/dL 9.0  8.2  8.1   Hematocrit 36.0 - 46.0 % 28.7  28.0  27.3   Platelets 150 - 400 K/uL 256  248  232        Latest Ref Rng & Units 02/01/2024    3:06 AM 01/31/2024    5:57 AM 01/30/2024    4:11 AM  CMP  Glucose 70 - 99 mg/dL 94  83  65   BUN 8 - 23 mg/dL 62  65  64   Creatinine 0.44 - 1.00 mg/dL 7.84  7.67  7.20   Sodium 135 - 145 mmol/L 141  144  142   Potassium 3.5 - 5.1 mmol/L 4.2  4.2  4.4   Chloride 98 - 111 mmol/L 93  97  99   CO2 22 - 32 mmol/L 40  35  29   Calcium  8.9 - 10.3 mg/dL 89.6  89.6  89.8       Microbiology: 01/26/2024 blood cultures Pseudomonas into 2 sets 01/29/2024 blood culture sent Studies/Results: No results found.    Assessment/Plan: 83 year old female with history of bilateral lymphedema with chronic surgical changes presenting with fever and chills  Pseudomonas bacteremia: Susceptible organism Source of the infection likely the legs Patient is currently on ceftazidime Initially she was on cefepime but was changed to ceftazidime due to myoclonus Repeat blood culture neg 2D echo does not show any valvular vegetation though a limited study As she has improved and wbc normalized can complete a total  7 days of appropriate IV antibiotic (ceftazidime )  Metabolic encephalopathy secondary to likely CO2 narcosis, much improved  Acute on chronic hypoxic and hypercapnic respiratory failure was on BiPAP  COPD  SVT on amiodarone  CHF  Chronic lymphedema with verrucous changes and fibrous changes This is suggestive of elephantiasis nostras verrucosa  AKI on CKD some worsening  Myoclonus slight  improvement Opioids can linger in the system for a longer period of time because of AKI and can compound the problem  Discussed the management with care team and ID pharmacist ID will sign off- call if needed

## 2024-02-01 NOTE — Progress Notes (Signed)
 [facility] CLINIC CARDIOLOGY PROGRESS NOTE       Patient ID: Kelly Hogan MRN: [id number] DOB/AGE: [birth year] 82 y.o.  Admit date: 01/26/2024 Referring Physician Dr. Laneta Blunt Primary Physician [name], [name] LABOR, MD  Primary Cardiologist Damien Lee, NP Reason for Consultation AF, bacteremia  HPI: Kelly Hogan is a 83 y.o. female  with a past medical history of COPD, chronic respiratory failure on 4 L, hypertension, hyperlipidemia, type 2 diabetes, obesity, obesity hypoventilation syndrome, history of venous insufficiency who presented to the ED on 01/26/2024 for concern for leg infection.  Noted to be in atrial fibrillation RVR initially, placed on IV amiodarone and ultimately converted to normal sinus rhythm.  Blood cultures were positive for Pseudomonas.  Cardiology was consulted for further evaluation.   Interval history: - Patient seen and examined this morning, resting comfortably in bed on supplemental O2. - States that she is hurting all over.  Denies any chest pain, shortness of breath. - Remains in sinus rhythm.  Continues to tolerate p.o. amiodarone.  Review of systems complete and found to be negative unless listed above    Past Medical History:  Diagnosis Date   Asthma    COPD (chronic obstructive pulmonary disease) (HCC)    Diabetes mellitus without complication (HCC)    Hypertension    Morbid obesity with BMI of 50.0-59.9, adult (HCC) 07/13/2019   Stomach ulcer     No past surgical history on file.  Medications Prior to Admission  Medication Sig Dispense Refill Last Dose/Taking   acetaminophen  (TYLENOL ) 500 MG tablet Take 500-1,000 mg by mouth every 6 (six) hours as needed for mild pain or fever.    01/26/2024   albuterol  (PROVENTIL ) (2.5 MG/3ML) 0.083% nebulizer solution Take 3 mLs by nebulization every 6 (six) hours as needed for wheezing.   01/26/2024   aspirin  81 MG EC tablet Take 81 mg by mouth daily.   01/25/2024   carvedilol  (COREG ) 25 MG  tablet Take 12.5 mg by mouth in the morning and at bedtime.   01/25/2024   Cholecalciferol  25 MCG (1000 UT) tablet Take 1,000 Units by mouth daily.   01/25/2024   ciclopirox (LOPROX) 0.77 % cream Apply 0.77 % topically 2 (two) times daily.   01/25/2024   diclofenac Sodium (VOLTAREN) 1 % GEL Apply 2 g topically 4 (four) times daily.   01/26/2024   docusate sodium  (COLACE) 100 MG capsule Take 100 mg by mouth 2 (two) times daily.  4 01/26/2024   empagliflozin (JARDIANCE) 10 MG TABS tablet Take 1 tablet by mouth daily.   01/25/2024   fluticasone  (FLONASE ) 50 MCG/ACT nasal spray Place 1 spray into both nostrils daily.    01/25/2024   gatifloxacin (ZYMAXID) 0.5 % SOLN Place 1 drop into the left eye 4 (four) times daily.   Past Month   ketorolac (ACULAR) 0.5 % ophthalmic solution Place 1 drop into the left eye 2 (two) times daily.   Unknown   Lidocaine 4 % PTCH Place onto the skin.   Past Month   olmesartan (BENICAR) 5 MG tablet Take 5 mg by mouth daily.   01/25/2024   omeprazole (PRILOSEC) 20 MG capsule Take 20 mg by mouth daily.   01/25/2024   Oyster Shell 500 MG TABS Take 500 mg by mouth in the morning, at noon, and at bedtime.   01/25/2024   pravastatin (PRAVACHOL) 20 MG tablet Take 20 mg by mouth daily.   01/25/2024   prednisoLONE acetate (PRED FORTE) 1 % ophthalmic suspension Place  1 drop into the left eye 4 (four) times daily.   Past Month   PROAIR  HFA 108 (90 Base) MCG/ACT inhaler Inhale 2 puffs into the lungs every 4 (four) hours as needed for wheezing or shortness of breath.    01/26/2024   senna (SENOKOT) 8.6 MG tablet Take 1 tablet by mouth daily.   01/25/2024   Torsemide 40 MG TABS Take 40 mg by mouth 2 (two) times daily.   01/25/2024   TRELEGY ELLIPTA 100-62.5-25 MCG/INH AEPB Take 1 puff by mouth daily.   01/25/2024   amLODipine (NORVASC) 5 MG tablet Take 5 mg by mouth daily. (Patient not taking: Reported on 01/26/2024)   Not Taking   ferrous sulfate 324 MG TBEC Take 324 mg by mouth.    Unknown   furosemide  (LASIX ) 40 MG tablet Take 40-80 mg by mouth as directed.  (Patient not taking: Reported on 01/26/2024)   Not Taking   hydrALAZINE  (APRESOLINE ) 25 MG tablet Take 25 mg by mouth 2 (two) times daily. (Patient not taking: Reported on 01/26/2024)   Not Taking   ipratropium-albuterol  (DUONEB) 0.5-2.5 (3) MG/3ML SOLN Take 3 mLs by nebulization every 6 (six) hours as needed (wheezing). (Patient not taking: Reported on 01/26/2024)   Not Taking   omeprazole (PRILOSEC) 20 MG capsule Take by mouth.      spironolactone  (ALDACTONE ) 25 MG tablet Take 25 mg by mouth daily. (Patient not taking: Reported on 01/26/2024)  3 Not Taking   telmisartan (MICARDIS) 80 MG tablet Take 80 mg by mouth daily. (Patient not taking: Reported on 01/26/2024)   Not Taking   Social History   Socioeconomic History   Marital status: Single    Spouse name: Not on file   Number of children: Not on file   Years of education: Not on file   Highest education level: Not on file  Occupational History   Not on file  Tobacco Use   Smoking status: Former    Types: Cigarettes   Smokeless tobacco: Never  Substance and Sexual Activity   Alcohol use: No   Drug use: No   Sexual activity: Not on file  Other Topics Concern   Not on file  Social History Narrative   Not on file   Social Drivers of Health   Financial Resource Strain: Low Risk (08/12/2023)   Received from [facility]   Overall Financial Resource Strain (CARDIA)    Difficulty of Paying Living Expenses: Not very hard  Food Insecurity: Patient Declined (01/27/2024)   Hunger Vital Sign    Worried About Running Out of Food in the Last Year: Patient declined    Ran Out of Food in the Last Year: Patient declined  Transportation Needs: Patient Declined (01/27/2024)   PRAPARE - Administrator, Civil Service (Medical): Patient declined    Lack of Transportation (Non-Medical): Patient declined  Physical Activity: Not on file  Stress: No  Stress Concern Present (11/17/2023)   Received from [facility] - Occupational Stress Questionnaire    Do you feel stress - tense, restless, nervous, or anxious, or unable to sleep at night because your mind is troubled all the time - these days?: Only a little  Social Connections: Patient Declined (01/27/2024)   Social Connection and Isolation Panel    Frequency of Communication with Friends and Family: Patient declined    Frequency of Social Gatherings with Friends and Family: Patient declined    Attends Religious Services:  Patient declined    Active Member of Clubs or Organizations: Patient declined    Attends Banker Meetings: Patient declined    Marital Status: Patient declined  Intimate Partner Violence: Patient Declined (01/27/2024)   Humiliation, Afraid, Rape, and Kick questionnaire    Fear of Current or Ex-Partner: Patient declined    Emotionally Abused: Patient declined    Physically Abused: Patient declined    Sexually Abused: Patient declined    Family History  Problem Relation Age of Onset   Hypertension Sister    Diabetes Mellitus II Brother      Vitals:   02/01/24 0000 02/01/24 0400 02/01/24 0500 02/01/24 0800  BP: (!) 146/64 (!) 158/74  (!) 148/64  Pulse: 80 74  68  Resp: 20 12    Temp: 98 F (36.7 C) 98.2 F (36.8 C)  98.1 F (36.7 C)  TempSrc: Oral Oral  Axillary  SpO2: 96% 94%  98%  Weight:   130.4 kg   Height:        PHYSICAL EXAM General: Chronically ill appearing female, well nourished, in no acute distress. HEENT: Normocephalic and atraumatic. Neck: No JVD.  Lungs: Normal respiratory effort on 2L. Coarse breath sounds bilaterally.  Heart: HRRR. Normal S1 and S2 without gallops or murmurs.  Abdomen: Non-distended appearing.  Msk: Normal strength and tone for age. Extremities: Warm and well perfused. No clubbing, cyanosis. Bilateral LE with edema and erythema, wraps in place -overall improved.   Neuro: Alert and oriented X 3. Psych: Answers questions appropriately.   Labs: Basic Metabolic Panel: Recent Labs    01/31/24 0557 02/01/24 0306  NA 144 141  K 4.2 4.2  CL 97* 93*  CO2 35* 40*  GLUCOSE 83 94  BUN 65* 62*  CREATININE 2.32* 2.15*  CALCIUM  10.3 10.3   Liver Function Tests: No results for input(s): AST, ALT, ALKPHOS, BILITOT, PROT, ALBUMIN in the last 72 hours.  No results for input(s): LIPASE, AMYLASE in the last 72 hours. CBC: Recent Labs    01/30/24 0411  WBC 11.3*  HGB 9.0*  HCT 28.7*  MCV 91.4  PLT 256   Cardiac Enzymes: No results for input(s): CKTOTAL, CKMB, CKMBINDEX, TROPONINIHS in the last 72 hours. BNP: No results for input(s): BNP in the last 72 hours. D-Dimer: No results for input(s): DDIMER in the last 72 hours. Hemoglobin A1C: No results for input(s): HGBA1C in the last 72 hours. Fasting Lipid Panel: No results for input(s): CHOL, HDL, LDLCALC, TRIG, CHOLHDL, LDLDIRECT in the last 72 hours. Thyroid Function Tests: No results for input(s): TSH, T4TOTAL, T3FREE, THYROIDAB in the last 72 hours.  Invalid input(s): FREET3  Anemia Panel: No results for input(s): VITAMINB12, FOLATE, FERRITIN, TIBC, IRON, RETICCTPCT in the last 72 hours.   Radiology: US  RENAL Result Date: 01/29/2024 CLINICAL DATA:  Acute renal failure. EXAM: RENAL / URINARY TRACT ULTRASOUND COMPLETE COMPARISON:  None Available. FINDINGS: Right Kidney: Renal measurements: 10.8 x 4.5 x 5.2 cm = volume: 131 mL. Echogenicity within normal limits. No mass or hydronephrosis visualized. Left Kidney: Renal measurements: 11.2 x 5.2 x 5.0 cm = volume: 150 mL. Echogenicity within normal limits. No mass or hydronephrosis visualized. Limited definition of both kidneys by ultrasound due to body habitus. Bladder: Appears normal for degree of bladder distention. Other: None. IMPRESSION: Unremarkable renal ultrasound.  Electronically Signed   By: Marcey Moan M.D.   On: 01/29/2024 16:06   DG Chest Port 1 View Result Date: 01/28/2024 CLINICAL DATA:  Shortness of breath  EXAM: PORTABLE CHEST 1 VIEW COMPARISON:  Chest x-ray 01/26/2024 FINDINGS: Heart is enlarged. There central pulmonary vascular congestion. There some minimal patchy opacities in the right mid lung. The costophrenic angles are clear. No pneumothorax or acute fracture. There are degenerative changes of the left shoulder. IMPRESSION: 1. Cardiomegaly with central pulmonary vascular congestion. 2. Minimal patchy opacities in the right mid lung may represent atelectasis or infection. Electronically Signed   By: Greig Pique M.D.   On: 01/28/2024 17:49   ECHOCARDIOGRAM COMPLETE Result Date: 01/28/2024    ECHOCARDIOGRAM REPORT   Patient Name:   Kelly Hogan Date of Exam: 01/28/2024 Medical Rec #:  [id number]         Height:       64.0 in Accession #:    [id number]        Weight:       308.6 lb Date of Birth:  1940-07-11         BSA:          2.353 m Patient Age:    83 years          BP:           117/59 mmHg Patient Gender: F                 HR:           89 bpm. Exam Location:  [facility] Procedure: 2D Echo, Cardiac Doppler and Color Doppler (Both Spectral and Color            Flow Doppler were utilized during procedure). Indications:     Abnormal ECG R94.31  History:         Patient has no prior history of Echocardiogram examinations.                  COPD; Risk Factors:Hypertension and Diabetes.  Sonographer:     Christopher Furnace Referring Phys:  [id number] [facility] Diagnosing Phys: Marsa Dooms MD  Sonographer Comments: Technically challenging study due to limited acoustic windows, patient is obese and no apical window. IMPRESSIONS  1. Left ventricular ejection fraction, by estimation, is 60 to 65%. The left ventricle has normal function. The left ventricle has no regional wall motion abnormalities. Left ventricular diastolic parameters are indeterminate.  2.  Right ventricular systolic function is normal. The right ventricular size is normal.  3. The mitral valve is normal in structure. Mild mitral valve regurgitation. No evidence of mitral stenosis.  4. The aortic valve is normal in structure. Aortic valve regurgitation is not visualized. No aortic stenosis is present.  5. The inferior vena cava is normal in size with greater than 50% respiratory variability, suggesting right atrial pressure of 3 mmHg. FINDINGS  Left Ventricle: Left ventricular ejection fraction, by estimation, is 60 to 65%. The left ventricle has normal function. The left ventricle has no regional wall motion abnormalities. Strain was performed and the global longitudinal strain is indeterminate. The left ventricular internal cavity size was normal in size. There is no left ventricular hypertrophy. Left ventricular diastolic parameters are indeterminate. Right Ventricle: The right ventricular size is normal. No increase in right ventricular wall thickness. Right ventricular systolic function is normal. Left Atrium: Left atrial size was normal in size. Right Atrium: Right atrial size was normal in size. Pericardium: There is no evidence of pericardial effusion. Mitral Valve: The mitral valve is normal in structure. Mild mitral valve regurgitation. No evidence of mitral valve stenosis. Tricuspid Valve: The tricuspid valve is normal  in structure. Tricuspid valve regurgitation is mild . No evidence of tricuspid stenosis. Aortic Valve: The aortic valve is normal in structure. Aortic valve regurgitation is not visualized. No aortic stenosis is present. Pulmonic Valve: The pulmonic valve was normal in structure. Pulmonic valve regurgitation is not visualized. No evidence of pulmonic stenosis. Aorta: The aortic root is normal in size and structure. Venous: The inferior vena cava is normal in size with greater than 50% respiratory variability, suggesting right atrial pressure of 3 mmHg. IAS/Shunts: No atrial  level shunt detected by color flow Doppler. Additional Comments: 3D was performed not requiring image post processing on an independent workstation and was indeterminate.  LEFT VENTRICLE PLAX 2D LVIDd:         4.80 cm LVIDs:         2.80 cm LV PW:         1.00 cm LV IVS:        1.10 cm LVOT diam:     2.00 cm LVOT Area:     3.14 cm  LEFT ATRIUM         Index LA diam:    2.90 cm 1.23 cm/m   AORTA Ao Root diam: 2.70 cm  SHUNTS Systemic Diam: 2.00 cm Marsa Dooms MD Electronically signed by Marsa Dooms MD Signature Date/Time: 01/28/2024/1:33:28 PM    Final    US  Venous Img Lower Unilateral Left (DVT) Result Date: 01/27/2024 EXAM: ULTRASOUND DUPLEX OF THE LEFT LOWER EXTREMITY VEINS TECHNIQUE: Duplex ultrasound using B-mode/gray scaled imaging and Doppler spectral analysis and color flow was obtained of the deep venous structures of the left lower extremity. COMPARISON: None available. CLINICAL HISTORY: Swelling. FINDINGS: There is no evidence of deep venous thrombosis within the left lower extremity. The common femoral vein, femoral vein, and popliteal vein of the left lower extremity demonstrate normal compressibility with normal color flow and spectral analysis. There is limited visualization of the left posterior tibial veins and the left peroneal veins are not identified. IMPRESSION: 1. No evidence of DVT in the left lower extremity. 2. Limited visualization of the left posterior tibial veins and the left peroneal veins are not identified. Electronically signed by: Evalene Coho MD 01/27/2024 05:43 AM EST RP Workstation: [id number]   DG Chest Port 1 View Result Date: 01/26/2024 CLINICAL DATA:  Questionable sepsis EXAM: PORTABLE CHEST 1 VIEW COMPARISON:  Chest radiograph dated 07/07/2019. FINDINGS: No focal consolidation, pleural effusion or pneumothorax. Top-normal cardiac size. No acute osseous pathology. IMPRESSION: No active disease. Electronically Signed   By: Vanetta Chou M.D.    On: 01/26/2024 13:58    ECHO as above  TELEMETRY (personally reviewed): Sinus rhythm rate 60s  EKG (personally reviewed): Sinus rhythm rate 81 bpm  Data reviewed by me 02/01/2024: last 24h vitals tele labs imaging I/O ED provider note, admission H&P, hospitalist progress note, ID note, nephrology note  Principal Problem:   Sepsis due to cellulitis [facility]) Active Problems:   Essential (primary) hypertension   Type 2 diabetes mellitus (HCC)   Chronic venous insufficiency   HLD (hyperlipidemia)   Morbid obesity with BMI of 50.0-59.9, adult (HCC)   OSA (obstructive sleep apnea)   Obesity hypoventilation syndrome (HCC)   (HFpEF) heart failure with preserved ejection fraction (HCC)   Bacteremia due to Pseudomonas   CO2 narcosis   Metabolic encephalopathy   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nostras verrucosa    ASSESSMENT AND PLAN:  Kelly Hogan is a 83 y.o. female  with a past medical history of COPD, chronic respiratory failure  on 4 L, hypertension, hyperlipidemia, type 2 diabetes, obesity, obesity hypoventilation syndrome, history of venous insufficiency who presented to the ED on 01/26/2024 for concern for leg infection.  Noted to be in atrial fibrillation RVR initially, placed on IV amiodarone and ultimately converted to normal sinus rhythm.  Blood cultures were positive for Pseudomonas.  Cardiology was consulted for further evaluation.   # Acute on chronic hypoxic respiratory failure # Atrial fibrillation RVR # New onset atrial fibrillation # Cellulitis # Pseudomonas bacteremia # Hypertension Patient sent to the ED for evaluation of cellulitis, ultimately developed atrial fibrillation RVR which was treated with IV amiodarone and she converted to normal sinus rhythm yesterday.  Found to have positive blood cultures growing Pseudomonas.  Last week developed worsening hypoxia and was placed on BiPAP, now on nasal cannula.  Echo this admission with EF 60-65%, no WMA's, mild MR. - Continue IV Lasix  60  mg twice daily. - Continue p.o. amiodarone load with 400 mg twice daily for 7 days followed by 200 mg daily. - Continue Eliquis 2.5 mg twice daily (dose appropriate given age, renal function). - Continue pravastatin 20 mg daily. - Reviewed ID note from Friday, no mention of TEE and TTE from this admission did not show any significant valvular issues. - Further management of cellulitis, bacteremia as per primary team.  This patient's plan of care was discussed and created with Dr. Florencio and he is in agreement.  Signed: Danita Bloch, PA-C  02/01/2024, 9:56 AM [facility]

## 2024-02-01 NOTE — Progress Notes (Signed)
 Occupational Therapy Treatment Patient Details Name: Kelly Hogan MRN: [id number] DOB: June 05, 1940 Today's Date: 02/01/2024   History of present illness Kelly Hogan is a 83 y.o. 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 She was placed on BiPAP and uses 4L at baseline.   OT comments  Patient seen for OT treatment on this date. Upon arrival to room patient resting in bed on 4L of O2 with son present, agreeable to treatment. PT/OT cotx as patient requires +3 for mobility. Patient agreeable to sit EOB; total A x 3 for bed mobility, O2 dropped to 85%, increased to 5L with good improvement in O2 saturation. From EOB, patient performed 2 sit<>stands with max A to transition into standing, mod-max A x 1 to maintain stance; on 2nd trial able to take side steps to the L with max A x 2 to facilitate weight shifting and to A with walker management. Patient with good efforts with mobility and fatigues quickly; she transitioned back to EOB and then required total A x 3 to return to supine, made good effort to reposition BLE in bed.  Patient ended treatment in bed with bed/chair alarm on and all needs within reach. Patient making good progress toward goals, will continue to follow POC. Discharge recommendation remains appropriate.  .      If plan is discharge home, recommend the following:  Two people to help with walking and/or transfers;Two people to help with bathing/dressing/bathroom;Direct supervision/assist for medications management;Supervision due to cognitive status;Assist for transportation;Assistance with cooking/housework;Help with stairs or ramp for entrance;Assistance with feeding;Direct supervision/assist for financial management   Equipment  Recommendations  Other (comment) (defer to next venue of care)    Recommendations for Other Services      Precautions / Restrictions Precautions Precautions: Fall Recall of Precautions/Restrictions: Impaired Restrictions Weight Bearing Restrictions Per Provider Order: No       Mobility Bed Mobility Overal bed mobility: Needs Assistance Bed Mobility: Supine to Sit, Sit to Supine     Supine to sit: Total assist, +2 for physical assistance Sit to supine: Total assist, +2 for physical assistance   General bed mobility comments: total 3+ assist to mobilize to EOB from supine; 1 assist at each leg to guide on/off bed; total 2+ to scoot to HOB in supine    Transfers Overall transfer level: Needs assistance Equipment used: Rolling walker (2 wheels) Transfers: Sit to/from Stand Sit to Stand: Max assist, +2 physical assistance           General transfer comment: max A x 2 for sit<>stand for lifting A and walker management     Balance Overall balance assessment: Needs assistance Sitting-balance support: Bilateral upper extremity supported, Feet supported Sitting balance-Leahy Scale: Poor Sitting balance - Comments: need for BUE to support in sitting Postural control: Posterior lean Standing balance support: Reliant on assistive device for balance, Bilateral upper extremity supported Standing balance-Leahy Scale: Poor                             ADL either performed or assessed with clinical judgement   ADL Overall ADL's : Needs assistance/impaired Eating/Feeding: Moderate assistance Eating/Feeding Details (indicate cue type and reason): increased set up and HOH to  hold water cup while sitting upright in bed Grooming: Sitting;Maximal assistance                                      Extremity/Trunk Assessment Upper Extremity Assessment Upper Extremity Assessment: LUE deficits/detail LUE Deficits / Details: hx of LUE pain limiting WB and  mobility            Vision       Perception     Praxis     Communication Communication Communication: Impaired Factors Affecting Communication: Reduced clarity of speech   Cognition Arousal: Lethargic Behavior During Therapy: WFL for tasks assessed/performed Cognition: No apparent impairments                               Following commands: Intact        Cueing   Cueing Techniques: Verbal cues, Tactile cues  Exercises      Shoulder Instructions       General Comments patient on 4L of O2 at start of tx, turned up to 5L for mobility    Pertinent Vitals/ Pain       Pain Assessment Pain Assessment: 0-10 Pain Score: 5  Pain Location: generalized Pain Descriptors / Indicators: Tender Pain Intervention(s): Monitored during session  Home Living                                          Prior Functioning/Environment              Frequency  Min 2X/week        Progress Toward Goals  OT Goals(current goals can now be found in the care plan section)  Progress towards OT goals: Progressing toward goals  Acute Rehab OT Goals Patient Stated Goal: to go home OT Goal Formulation: With patient Time For Goal Achievement: 02/12/24 Potential to Achieve Goals: Fair ADL Goals Pt Will Perform Grooming: sitting;with set-up;with supervision Pt Will Transfer to Toilet: stand pivot transfer;bedside commode;with +2 assist;with mod assist Pt Will Perform Toileting - Clothing Manipulation and hygiene: sit to/from stand;with mod assist  Plan      Co-evaluation    PT/OT/SLP Co-Evaluation/Treatment: Yes Reason for Co-Treatment: Complexity of the patient's impairments (multi-system involvement) PT goals addressed during session: Mobility/safety with mobility OT goals addressed during session: ADL's and self-care      AM-PAC OT 6 Clicks Daily Activity     Outcome Measure   Help from another person eating meals?: Total Help from  another person taking care of personal grooming?: A Lot Help from another person toileting, which includes using toliet, bedpan, or urinal?: Total Help from another person bathing (including washing, rinsing, drying)?: Total Help from another person to put on and taking off regular upper body clothing?: Total Help from another person to put on and taking off regular lower body clothing?: Total 6 Click Score: 7    End of Session Equipment Utilized During Treatment: Rolling walker (2 wheels);Gait belt;Oxygen  OT Visit Diagnosis: Muscle weakness (generalized) (M62.81);Pain;Other abnormalities of gait and mobility (R26.89) Pain - Right/Left: Left Pain - part of body: Leg   Activity Tolerance Patient tolerated treatment well   Patient Left in bed;with call bell/phone within reach;with bed alarm set;with family/visitor present   Nurse Communication  Time: 8858-8786 OT Time Calculation (min): 32 min  Charges: OT General Charges $OT Visit: 1 Visit OT Treatments $Self Care/Home Management : 23-37 mins  Rogers Clause, OT/L [facility], 02/01/2024

## 2024-02-01 NOTE — Plan of Care (Signed)
  Problem: Fluid Volume: Goal: Hemodynamic stability will improve Outcome: Progressing   Problem: Clinical Measurements: Goal: Diagnostic test results will improve Outcome: Progressing Goal: Signs and symptoms of infection will decrease Outcome: Progressing   Problem: Respiratory: Goal: Ability to maintain adequate ventilation will improve Outcome: Progressing   Problem: Education: Goal: Knowledge of General Education information will improve Description: Including pain rating scale, medication(s)/side effects and non-pharmacologic comfort measures Outcome: Progressing   Problem: Health Behavior/Discharge Planning: Goal: Ability to manage health-related needs will improve Outcome: Progressing   Problem: Clinical Measurements: Goal: Ability to maintain clinical measurements within normal limits will improve Outcome: Progressing Goal: Will remain free from infection Outcome: Progressing Goal: Diagnostic test results will improve Outcome: Progressing Goal: Respiratory complications will improve Outcome: Progressing Goal: Cardiovascular complication will be avoided Outcome: Progressing   Problem: Activity: Goal: Risk for activity intolerance will decrease Outcome: Progressing   Problem: Nutrition: Goal: Adequate nutrition will be maintained Outcome: Progressing   Problem: Coping: Goal: Level of anxiety will decrease Outcome: Progressing   Problem: Elimination: Goal: Will not experience complications related to bowel motility Outcome: Progressing Goal: Will not experience complications related to urinary retention Outcome: Progressing   Problem: Pain Managment: Goal: General experience of comfort will improve and/or be controlled Outcome: Progressing   Problem: Safety: Goal: Ability to remain free from injury will improve Outcome: Progressing   Problem: Skin Integrity: Goal: Risk for impaired skin integrity will decrease Outcome: Progressing   Problem:  Clinical Measurements: Goal: Ability to avoid or minimize complications of infection will improve Outcome: Progressing   Problem: Skin Integrity: Goal: Skin integrity will improve Outcome: Progressing

## 2024-02-01 NOTE — Progress Notes (Signed)
 Physical Therapy Treatment Patient Details Name: Kelly Hogan MRN: [id number] DOB: 1940-04-04 Today's Date: 02/01/2024   History of Present Illness Kelly Hogan is a 83 y.o. 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 She was placed on BiPAP and uses 4L at baseline.    PT Comments  Cotx with OT for safety. Pt demonstrates improved activity tolerance and ability to perform STS during today's session. Pt puts forth good effort to participate in tasks. Pt continues to require heavy assistance for bed mobility, transfers, and amb, however anticipate pt will continue to make progress with continued efforts with therapy team; Max A x 2 for bed mobility, MAX x 2 for STS, mod A x 2 for side stepping at EOB.  Would benefit from skilled PT to address above deficits and promote optimal return to PLOF.     If plan is discharge home, recommend the following: A lot of help with walking and/or transfers   Can travel by private vehicle     No  Equipment Recommendations  Other (comment) (TBD at next venue)    Recommendations for Other Services       Precautions / Restrictions Precautions Precautions: Fall Recall of Precautions/Restrictions: Impaired Restrictions Weight Bearing Restrictions Per Provider Order: No     Mobility  Bed Mobility Overal bed mobility: Needs Assistance Bed Mobility: Supine to Sit, Sit to Supine     Supine to sit: Total assist, +2 for physical assistance Sit to supine: Total assist, +2 for physical assistance   General bed mobility comments: 1 person assisting at trunk, 1 person each LE to get to EOB. Pt is able to perform SLR/assist her LEs close to EOB.    Transfers Overall transfer level: Needs  assistance Equipment used: Rolling walker (2 wheels) Transfers: Sit to/from Stand Sit to Stand: Max assist, +2 physical assistance           General transfer comment: MAX x 2 from elevated surface. Cuing for hand placement.    Ambulation/Gait Ambulation/Gait assistance: Mod assist, +2 physical assistance Gait Distance (Feet): 4 Feet Assistive device: Rolling walker (2 wheels) Gait Pattern/deviations: Step-to pattern Gait velocity: dec     General Gait Details: Side steps at EOB. Mod x 2 for RW management and to maintain balance. Pt has posterior bias upon standing   Stairs             Wheelchair Mobility     Tilt Bed    Modified Rankin (Stroke Patients Only)       Balance Overall balance assessment: Needs assistance Sitting-balance support: Bilateral upper extremity supported, Feet supported Sitting balance-Leahy Scale: Poor Sitting balance - Comments: BUE to support sitting. min A -CGA sitting at EOB Postural control: Posterior lean Standing balance support: Reliant on assistive device for balance, Bilateral upper extremity supported Standing balance-Leahy Scale: Poor Standing balance comment: Heavy reliance on RW for balance. Multimodal cuing to maintain upright posture.                            Communication Communication Communication: Impaired Factors Affecting Communication: Reduced clarity of speech  Cognition Arousal: Lethargic Behavior During Therapy: WFL for tasks assessed/performed   PT - Cognitive impairments: No apparent impairments  PT - Cognition Comments: pleasant and agreeable to PT. Required cuing to keep eyes open, however demonstrated good participation with therapy Following commands: Intact      Cueing Cueing Techniques: Verbal cues, Tactile cues  Exercises      General Comments General comments (skin integrity, edema, etc.): 4L throughout most of session up to 5L with side  stepping. HR up to 110bpm      Pertinent Vitals/Pain Pain Assessment Pain Assessment: 0-10 Pain Score: 5  Pain Location: generalized Pain Descriptors / Indicators: Tender Pain Intervention(s): Monitored during session    Home Living                          Prior Function            PT Goals (current goals can now be found in the care plan section) Acute Rehab PT Goals PT Goal Formulation: With patient Time For Goal Achievement: 02/12/24 Potential to Achieve Goals: Fair Progress towards PT goals: Progressing toward goals    Frequency    Min 2X/week      PT Plan      Co-evaluation PT/OT/SLP Co-Evaluation/Treatment: Yes Reason for Co-Treatment: Complexity of the patient's impairments (multi-system involvement) PT goals addressed during session: Mobility/safety with mobility OT goals addressed during session: ADL's and self-care      AM-PAC PT 6 Clicks Mobility   Outcome Measure  Help needed turning from your back to your side while in a flat bed without using bedrails?: Total Help needed moving from lying on your back to sitting on the side of a flat bed without using bedrails?: Total Help needed moving to and from a bed to a chair (including a wheelchair)?: Total Help needed standing up from a chair using your arms (e.g., wheelchair or bedside chair)?: A Lot Help needed to walk in hospital room?: A Lot Help needed climbing 3-5 steps with a railing? : Total 6 Click Score: 8    End of Session Equipment Utilized During Treatment: Gait belt Activity Tolerance: Patient tolerated treatment well Patient left: in bed;with call bell/phone within reach;with bed alarm set Nurse Communication: Mobility status PT Visit Diagnosis: Muscle weakness (generalized) (M62.81);Other abnormalities of gait and mobility (R26.89)     Time: 8858-8786 PT Time Calculation (min) (ACUTE ONLY): 32 min  Charges:    $Therapeutic Activity: 8-22 mins PT General Charges $$  ACUTE PT VISIT: 1 Visit                     Kelly Hogan, Kelly Hogan    Kelly Hogan 02/01/2024, 1:35 PM

## 2024-02-02 DIAGNOSIS — I890 Lymphedema, not elsewhere classified: Secondary | ICD-10-CM | POA: Diagnosis not present

## 2024-02-02 DIAGNOSIS — R7881 Bacteremia: Secondary | ICD-10-CM | POA: Diagnosis not present

## 2024-02-02 DIAGNOSIS — B965 Pseudomonas (aeruginosa) (mallei) (pseudomallei) as the cause of diseases classified elsewhere: Secondary | ICD-10-CM | POA: Diagnosis not present

## 2024-02-02 DIAGNOSIS — L03116 Cellulitis of left lower limb: Secondary | ICD-10-CM

## 2024-02-02 LAB — BASIC METABOLIC PANEL WITH GFR
Anion gap: 9 (ref 5–15)
BUN: 63 mg/dL — ABNORMAL HIGH (ref 8–23)
CO2: 41 mmol/L — ABNORMAL HIGH (ref 22–32)
Calcium: 10.3 mg/dL (ref 8.9–10.3)
Chloride: 86 mmol/L — ABNORMAL LOW (ref 98–111)
Creatinine, Ser: 2.56 mg/dL — ABNORMAL HIGH (ref 0.44–1.00)
GFR, Estimated: 18 mL/min — ABNORMAL LOW (ref >60)
Glucose, Bld: 107 mg/dL — ABNORMAL HIGH (ref 70–99)
Potassium: 4.1 mmol/L (ref 3.5–5.1)
Sodium: 136 mmol/L (ref 135–145)

## 2024-02-02 LAB — GLUCOSE, CAPILLARY
Glucose-Capillary: 89 mg/dL (ref 70–99)
Glucose-Capillary: 139 mg/dL — ABNORMAL HIGH (ref 70–99)
Glucose-Capillary: 165 mg/dL — ABNORMAL HIGH (ref 70–99)
Glucose-Capillary: 114 mg/dL — ABNORMAL HIGH (ref 70–99)

## 2024-02-02 LAB — CBC
HCT: 33 % — ABNORMAL LOW (ref 36.0–46.0)
Hemoglobin: 9.8 g/dL — ABNORMAL LOW (ref 12.0–15.0)
MCH: 28 pg (ref 26.0–34.0)
MCHC: 29.7 g/dL — ABNORMAL LOW (ref 30.0–36.0)
MCV: 94.3 fL (ref 80.0–100.0)
Platelets: 288 K/uL (ref 150–400)
RBC: 3.5 MIL/uL — ABNORMAL LOW (ref 3.87–5.11)
RDW: 12.9 % (ref 11.5–15.5)
WBC: 7.3 K/uL (ref 4.0–10.5)
nRBC: 0 % (ref 0.0–0.2)

## 2024-02-02 MED ORDER — FUROSEMIDE 40 MG PO TABS
60.0000 mg | ORAL_TABLET | Freq: Every day | ORAL | Status: DC
  Administered 2024-02-02: 60 mg via ORAL
  Filled 2024-02-02: qty 1

## 2024-02-02 MED ORDER — BISACODYL 5 MG PO TBEC
5.0000 mg | DELAYED_RELEASE_TABLET | Freq: Once | ORAL | Status: AC
  Administered 2024-02-02: 5 mg via ORAL
  Filled 2024-02-02: qty 1

## 2024-02-02 NOTE — Progress Notes (Signed)
 [facility] CLINIC CARDIOLOGY PROGRESS NOTE       Patient ID: Kelly Hogan MRN: [id number] DOB/AGE: [birth year] 83 y.o.  Admit date: 01/26/2024 Referring Physician Dr. Laneta Blunt Primary Physician [name], [name] LABOR, MD  Primary Cardiologist Damien Lee, NP Reason for Consultation AF, bacteremia  HPI: Kelly Hogan is a 83 y.o. female  with a past medical history of COPD, chronic respiratory failure on 4 L, hypertension, hyperlipidemia, type 2 diabetes, obesity, obesity hypoventilation syndrome, history of venous insufficiency who presented to the ED on 01/26/2024 for concern for leg infection.  Noted to be in atrial fibrillation RVR initially, placed on IV amiodarone and ultimately converted to normal sinus rhythm.  Blood cultures were positive for Pseudomonas.  Cardiology was consulted for further evaluation.   Interval history: - Patient seen and examined this morning, resting comfortably in bed on supplemental O2. - Has no complaints at the time of my evaluation.  - Remains in sinus rhythm.  Continues to tolerate p.o. amiodarone.  Review of systems complete and found to be negative unless listed above    Past Medical History:  Diagnosis Date   Asthma    COPD (chronic obstructive pulmonary disease) (HCC)    Diabetes mellitus without complication (HCC)    Hypertension    Morbid obesity with BMI of 50.0-59.9, adult (HCC) 07/13/2019   Stomach ulcer     No past surgical history on file.  Medications Prior to Admission  Medication Sig Dispense Refill Last Dose/Taking   acetaminophen  (TYLENOL ) 500 MG tablet Take 500-1,000 mg by mouth every 6 (six) hours as needed for mild pain or fever.    01/26/2024   albuterol  (PROVENTIL ) (2.5 MG/3ML) 0.083% nebulizer solution Take 3 mLs by nebulization every 6 (six) hours as needed for wheezing.   01/26/2024   aspirin  81 MG EC tablet Take 81 mg by mouth daily.   01/25/2024   carvedilol  (COREG ) 25 MG tablet Take 12.5 mg by mouth in the  morning and at bedtime.   01/25/2024   Cholecalciferol  25 MCG (1000 UT) tablet Take 1,000 Units by mouth daily.   01/25/2024   ciclopirox (LOPROX) 0.77 % cream Apply 0.77 % topically 2 (two) times daily.   01/25/2024   diclofenac Sodium (VOLTAREN) 1 % GEL Apply 2 g topically 4 (four) times daily.   01/26/2024   docusate sodium  (COLACE) 100 MG capsule Take 100 mg by mouth 2 (two) times daily.  4 01/26/2024   empagliflozin (JARDIANCE) 10 MG TABS tablet Take 1 tablet by mouth daily.   01/25/2024   fluticasone  (FLONASE ) 50 MCG/ACT nasal spray Place 1 spray into both nostrils daily.    01/25/2024   gatifloxacin (ZYMAXID) 0.5 % SOLN Place 1 drop into the left eye 4 (four) times daily.   Past Month   ketorolac (ACULAR) 0.5 % ophthalmic solution Place 1 drop into the left eye 2 (two) times daily.   Unknown   Lidocaine 4 % PTCH Place onto the skin.   Past Month   olmesartan (BENICAR) 5 MG tablet Take 5 mg by mouth daily.   01/25/2024   omeprazole (PRILOSEC) 20 MG capsule Take 20 mg by mouth daily.   01/25/2024   Oyster Shell 500 MG TABS Take 500 mg by mouth in the morning, at noon, and at bedtime.   01/25/2024   pravastatin (PRAVACHOL) 20 MG tablet Take 20 mg by mouth daily.   01/25/2024   prednisoLONE acetate (PRED FORTE) 1 % ophthalmic suspension Place 1 drop into the left  eye 4 (four) times daily.   Past Month   PROAIR  HFA 108 (90 Base) MCG/ACT inhaler Inhale 2 puffs into the lungs every 4 (four) hours as needed for wheezing or shortness of breath.    01/26/2024   senna (SENOKOT) 8.6 MG tablet Take 1 tablet by mouth daily.   01/25/2024   Torsemide 40 MG TABS Take 40 mg by mouth 2 (two) times daily.   01/25/2024   TRELEGY ELLIPTA 100-62.5-25 MCG/INH AEPB Take 1 puff by mouth daily.   01/25/2024   amLODipine (NORVASC) 5 MG tablet Take 5 mg by mouth daily. (Patient not taking: Reported on 01/26/2024)   Not Taking   ferrous sulfate 324 MG TBEC Take 324 mg by mouth.   Unknown   furosemide  (LASIX ) 40 MG  tablet Take 40-80 mg by mouth as directed.  (Patient not taking: Reported on 01/26/2024)   Not Taking   hydrALAZINE  (APRESOLINE ) 25 MG tablet Take 25 mg by mouth 2 (two) times daily. (Patient not taking: Reported on 01/26/2024)   Not Taking   ipratropium-albuterol  (DUONEB) 0.5-2.5 (3) MG/3ML SOLN Take 3 mLs by nebulization every 6 (six) hours as needed (wheezing). (Patient not taking: Reported on 01/26/2024)   Not Taking   omeprazole (PRILOSEC) 20 MG capsule Take by mouth.      spironolactone  (ALDACTONE ) 25 MG tablet Take 25 mg by mouth daily. (Patient not taking: Reported on 01/26/2024)  3 Not Taking   telmisartan (MICARDIS) 80 MG tablet Take 80 mg by mouth daily. (Patient not taking: Reported on 01/26/2024)   Not Taking   Social History   Socioeconomic History   Marital status: Single    Spouse name: Not on file   Number of children: Not on file   Years of education: Not on file   Highest education level: Not on file  Occupational History   Not on file  Tobacco Use   Smoking status: Former    Types: Cigarettes   Smokeless tobacco: Never  Substance and Sexual Activity   Alcohol use: No   Drug use: No   Sexual activity: Not on file  Other Topics Concern   Not on file  Social History Narrative   Not on file   Social Drivers of Health   Financial Resource Strain: Low Risk (08/12/2023)   Received from [facility]   Overall Financial Resource Strain (CARDIA)    Difficulty of Paying Living Expenses: Not very hard  Food Insecurity: Patient Declined (01/27/2024)   Hunger Vital Sign    Worried About Running Out of Food in the Last Year: Patient declined    Ran Out of Food in the Last Year: Patient declined  Transportation Needs: Patient Declined (01/27/2024)   PRAPARE - Administrator, Civil Service (Medical): Patient declined    Lack of Transportation (Non-Medical): Patient declined  Physical Activity: Not on file  Stress: No Stress Concern Present (11/17/2023)    Received from [facility] of Occupational Health - Occupational Stress Questionnaire    Do you feel stress - tense, restless, nervous, or anxious, or unable to sleep at night because your mind is troubled all the time - these days?: Only a little  Social Connections: Patient Declined (01/27/2024)   Social Connection and Isolation Panel    Frequency of Communication with Friends and Family: Patient declined    Frequency of Social Gatherings with Friends and Family: Patient declined    Attends Religious Services: Patient declined  Active Member of Clubs or Organizations: Patient declined    Attends Banker Meetings: Patient declined    Marital Status: Patient declined  Intimate Partner Violence: Patient Declined (01/27/2024)   Humiliation, Afraid, Rape, and Kick questionnaire    Fear of Current or Ex-Partner: Patient declined    Emotionally Abused: Patient declined    Physically Abused: Patient declined    Sexually Abused: Patient declined    Family History  Problem Relation Age of Onset   Hypertension Sister    Diabetes Mellitus II Brother      Vitals:   02/01/24 1227 02/01/24 2245 02/02/24 0500 02/02/24 0752  BP: (!) 151/75     Pulse: 79 75    Resp:  12    Temp: (!) 97.5 F (36.4 C)   97.6 F (36.4 C)  TempSrc: Oral   Oral  SpO2:  94%    Weight:   124.8 kg   Height:        PHYSICAL EXAM General: Chronically ill appearing female, well nourished, in no acute distress. HEENT: Normocephalic and atraumatic. Neck: No JVD.  Lungs: Normal respiratory effort on 2L. Coarse breath sounds bilaterally.  Heart: HRRR. Normal S1 and S2 without gallops or murmurs.  Abdomen: Non-distended appearing.  Msk: Normal strength and tone for age. Extremities: Warm and well perfused. No clubbing, cyanosis. Bilateral LE with edema and erythema, wraps in place -overall improved.  Neuro: Alert and oriented X 3. Psych: Answers questions appropriately.    Labs: Basic Metabolic Panel: Recent Labs    02/01/24 0306 02/02/24 0353  NA 141 136  K 4.2 4.1  CL 93* 86*  CO2 40* 41*  GLUCOSE 94 107*  BUN 62* 63*  CREATININE 2.15* 2.56*  CALCIUM  10.3 10.3   Liver Function Tests: No results for input(s): AST, ALT, ALKPHOS, BILITOT, PROT, ALBUMIN in the last 72 hours.  No results for input(s): LIPASE, AMYLASE in the last 72 hours. CBC: Recent Labs    02/02/24 0353  WBC 7.3  HGB 9.8*  HCT 33.0*  MCV 94.3  PLT 288   Cardiac Enzymes: No results for input(s): CKTOTAL, CKMB, CKMBINDEX, TROPONINIHS in the last 72 hours. BNP: No results for input(s): BNP in the last 72 hours. D-Dimer: No results for input(s): DDIMER in the last 72 hours. Hemoglobin A1C: No results for input(s): HGBA1C in the last 72 hours. Fasting Lipid Panel: No results for input(s): CHOL, HDL, LDLCALC, TRIG, CHOLHDL, LDLDIRECT in the last 72 hours. Thyroid Function Tests: No results for input(s): TSH, T4TOTAL, T3FREE, THYROIDAB in the last 72 hours.  Invalid input(s): FREET3  Anemia Panel: No results for input(s): VITAMINB12, FOLATE, FERRITIN, TIBC, IRON, RETICCTPCT in the last 72 hours.   Radiology: US  RENAL Result Date: 01/29/2024 CLINICAL DATA:  Acute renal failure. EXAM: RENAL / URINARY TRACT ULTRASOUND COMPLETE COMPARISON:  None Available. FINDINGS: Right Kidney: Renal measurements: 10.8 x 4.5 x 5.2 cm = volume: 131 mL. Echogenicity within normal limits. No mass or hydronephrosis visualized. Left Kidney: Renal measurements: 11.2 x 5.2 x 5.0 cm = volume: 150 mL. Echogenicity within normal limits. No mass or hydronephrosis visualized. Limited definition of both kidneys by ultrasound due to body habitus. Bladder: Appears normal for degree of bladder distention. Other: None. IMPRESSION: Unremarkable renal ultrasound. Electronically Signed   By: Marcey Moan M.D.   On: 01/29/2024 16:06   DG Chest  Port 1 View Result Date: 01/28/2024 CLINICAL DATA:  Shortness of breath EXAM: PORTABLE CHEST 1 VIEW COMPARISON:  Chest x-ray  01/26/2024 FINDINGS: Heart is enlarged. There central pulmonary vascular congestion. There some minimal patchy opacities in the right mid lung. The costophrenic angles are clear. No pneumothorax or acute fracture. There are degenerative changes of the left shoulder. IMPRESSION: 1. Cardiomegaly with central pulmonary vascular congestion. 2. Minimal patchy opacities in the right mid lung may represent atelectasis or infection. Electronically Signed   By: Greig Pique M.D.   On: 01/28/2024 17:49   ECHOCARDIOGRAM COMPLETE Result Date: 01/28/2024    ECHOCARDIOGRAM REPORT   Patient Name:   Kelly Hogan Date of Exam: 01/28/2024 Medical Rec #:  [id number]         Height:       64.0 in Accession #:    [id number]        Weight:       308.6 lb Date of Birth:  October 19, 1940         BSA:          2.353 m Patient Age:    83 years          BP:           117/59 mmHg Patient Gender: F                 HR:           89 bpm. Exam Location:  [facility] Procedure: 2D Echo, Cardiac Doppler and Color Doppler (Both Spectral and Color            Flow Doppler were utilized during procedure). Indications:     Abnormal ECG R94.31  History:         Patient has no prior history of Echocardiogram examinations.                  COPD; Risk Factors:Hypertension and Diabetes.  Sonographer:     Christopher Furnace Referring Phys:  [id number] [facility] Diagnosing Phys: Marsa Dooms MD  Sonographer Comments: Technically challenging study due to limited acoustic windows, patient is obese and no apical window. IMPRESSIONS  1. Left ventricular ejection fraction, by estimation, is 60 to 65%. The left ventricle has normal function. The left ventricle has no regional wall motion abnormalities. Left ventricular diastolic parameters are indeterminate.  2. Right ventricular systolic function is normal. The right ventricular size is normal.   3. The mitral valve is normal in structure. Mild mitral valve regurgitation. No evidence of mitral stenosis.  4. The aortic valve is normal in structure. Aortic valve regurgitation is not visualized. No aortic stenosis is present.  5. The inferior vena cava is normal in size with greater than 50% respiratory variability, suggesting right atrial pressure of 3 mmHg. FINDINGS  Left Ventricle: Left ventricular ejection fraction, by estimation, is 60 to 65%. The left ventricle has normal function. The left ventricle has no regional wall motion abnormalities. Strain was performed and the global longitudinal strain is indeterminate. The left ventricular internal cavity size was normal in size. There is no left ventricular hypertrophy. Left ventricular diastolic parameters are indeterminate. Right Ventricle: The right ventricular size is normal. No increase in right ventricular wall thickness. Right ventricular systolic function is normal. Left Atrium: Left atrial size was normal in size. Right Atrium: Right atrial size was normal in size. Pericardium: There is no evidence of pericardial effusion. Mitral Valve: The mitral valve is normal in structure. Mild mitral valve regurgitation. No evidence of mitral valve stenosis. Tricuspid Valve: The tricuspid valve is normal in structure. Tricuspid valve regurgitation is mild . No  evidence of tricuspid stenosis. Aortic Valve: The aortic valve is normal in structure. Aortic valve regurgitation is not visualized. No aortic stenosis is present. Pulmonic Valve: The pulmonic valve was normal in structure. Pulmonic valve regurgitation is not visualized. No evidence of pulmonic stenosis. Aorta: The aortic root is normal in size and structure. Venous: The inferior vena cava is normal in size with greater than 50% respiratory variability, suggesting right atrial pressure of 3 mmHg. IAS/Shunts: No atrial level shunt detected by color flow Doppler. Additional Comments: 3D was performed not  requiring image post processing on an independent workstation and was indeterminate.  LEFT VENTRICLE PLAX 2D LVIDd:         4.80 cm LVIDs:         2.80 cm LV PW:         1.00 cm LV IVS:        1.10 cm LVOT diam:     2.00 cm LVOT Area:     3.14 cm  LEFT ATRIUM         Index LA diam:    2.90 cm 1.23 cm/m   AORTA Ao Root diam: 2.70 cm  SHUNTS Systemic Diam: 2.00 cm Marsa Dooms MD Electronically signed by Marsa Dooms MD Signature Date/Time: 01/28/2024/1:33:28 PM    Final    US  Venous Img Lower Unilateral Left (DVT) Result Date: 01/27/2024 EXAM: ULTRASOUND DUPLEX OF THE LEFT LOWER EXTREMITY VEINS TECHNIQUE: Duplex ultrasound using B-mode/gray scaled imaging and Doppler spectral analysis and color flow was obtained of the deep venous structures of the left lower extremity. COMPARISON: None available. CLINICAL HISTORY: Swelling. FINDINGS: There is no evidence of deep venous thrombosis within the left lower extremity. The common femoral vein, femoral vein, and popliteal vein of the left lower extremity demonstrate normal compressibility with normal color flow and spectral analysis. There is limited visualization of the left posterior tibial veins and the left peroneal veins are not identified. IMPRESSION: 1. No evidence of DVT in the left lower extremity. 2. Limited visualization of the left posterior tibial veins and the left peroneal veins are not identified. Electronically signed by: Evalene Coho MD 01/27/2024 05:43 AM EST RP Workstation: [id number]   DG Chest Port 1 View Result Date: 01/26/2024 CLINICAL DATA:  Questionable sepsis EXAM: PORTABLE CHEST 1 VIEW COMPARISON:  Chest radiograph dated 07/07/2019. FINDINGS: No focal consolidation, pleural effusion or pneumothorax. Top-normal cardiac size. No acute osseous pathology. IMPRESSION: No active disease. Electronically Signed   By: Vanetta Chou M.D.   On: 01/26/2024 13:58    ECHO as above  TELEMETRY (personally reviewed): Sinus  rhythm rate 60s  EKG (personally reviewed): Sinus rhythm rate 81 bpm  Data reviewed by me 02/02/2024: last 24h vitals tele labs imaging I/O ED provider note, admission H&P, hospitalist progress note, ID note, nephrology note  Principal Problem:   Sepsis due to cellulitis [facility]) Active Problems:   Essential (primary) hypertension   Type 2 diabetes mellitus (HCC)   Chronic venous insufficiency   HLD (hyperlipidemia)   Morbid obesity with BMI of 50.0-59.9, adult (HCC)   OSA (obstructive sleep apnea)   Obesity hypoventilation syndrome (HCC)   (HFpEF) heart failure with preserved ejection fraction (HCC)   Bacteremia due to Pseudomonas   CO2 narcosis   Metabolic encephalopathy   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nostras verrucosa    ASSESSMENT AND PLAN:  TASHIBA TIMONEY is a 83 y.o. female  with a past medical history of COPD, chronic respiratory failure on 4 L, hypertension, hyperlipidemia, type 2 diabetes, obesity,  obesity hypoventilation syndrome, history of venous insufficiency who presented to the ED on 01/26/2024 for concern for leg infection.  Noted to be in atrial fibrillation RVR initially, placed on IV amiodarone and ultimately converted to normal sinus rhythm.  Blood cultures were positive for Pseudomonas.  Cardiology was consulted for further evaluation.   # Acute on chronic hypoxic respiratory failure # Atrial fibrillation RVR # New onset atrial fibrillation # Cellulitis # Pseudomonas bacteremia # Hypertension Patient sent to the ED for evaluation of cellulitis, ultimately developed atrial fibrillation RVR which was treated with IV amiodarone and she converted to normal sinus rhythm yesterday.  Found to have positive blood cultures growing Pseudomonas.  Last week developed worsening hypoxia and was placed on BiPAP, now on nasal cannula.  Echo this admission with EF 60-65%, no WMA's, mild MR. - Transition to PO lasix  60 mg daily.  - Continue p.o. amiodarone load with 400 mg twice daily for 7  days followed by 200 mg daily. - Continue Eliquis 2.5 mg twice daily (dose appropriate given age, renal function). - Continue pravastatin 20 mg daily. - Reviewed ID note from Friday & Monday, no mention of TEE and TTE from this admission did not show any significant valvular issues. - Further management of cellulitis, bacteremia as per primary team.  This patient's plan of care was discussed and created with Dr. Florencio and he is in agreement.  Signed: Danita Bloch, PA-C  02/02/2024, 8:16 AM [facility]

## 2024-02-02 NOTE — Progress Notes (Addendum)
 PROGRESS NOTE    Kelly Hogan   FMW:[id number] DOB: 1940/11/28  DOA: 01/26/2024 Date of Service: 02/02/24 which is hospital day 7  PCP: [name] [name] LABOR, MD    Hospital course / significant events:   HPI: Kelly Hogan is a 83 y.o. 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    11/11: admitted to hospitalist w/ hypotension, cellulitis. Metronidazole and cefepime in the ED. SVT with rates of 140s, pt requiring amio gtt.   11/12: (+)Pseudomonas bacteremia - linezolid and vancomycin. Remains on amio gtt for now, cardiology will see in AM. Was off amio briefly this evening d/t limited IV access and needed other Rx, HR was WNL but then up again and back on the drip. Renal fxn worse, resp acidosis and hypercarbia, pt placed on BiPAP. Fluids changed to bicarb infusion 1L. Hyperkalemia, mild, gave K shifting Rx w/ insulin/D50, also admin Lokelma.  11/13: renal fxn continuing to worsen - Nephrology consulted. Pt remains on BiPAP this morning. Was off briefly per her request but O2 down and SOB so placed back on the BiPAP. BP soft, gave another 1L fluids. Cardiology - transition to po amio, starting eliquis. ID recs change ceftazidime d/t myoclonus effects and confusion. Still worsening into the afternoon, CXR and BNP indicative of HFpEF w/ pulmonary edema despite fairly clear lungs (but exam difficult). Son is ok for trial of diuretics despite renal fxn.  11/14: good UOP and respiratory is improved onto [place] O2, renal fxn about same. Continuing on ceftazidime. Changed morphine to dilaudid w/ renal fxn. Renal US  no concerns. Cr 2.86. BCx repeated. Requiring BiPAP at night  11/15: somnolent but alerts to voice. Has not wanted to sit up in bed/chair. Cr  2.79. UOP yesterday looks like under documented. Continue diuresis. Repeat BCx NG thus far.  11/16: Cr 2.32, up to chair today encouraging.  11/17: Cr 2.15, CO2 levels higher today on BMP, pt refusing BiPAP overnight, encouraged once again she needs to wear at night, needs to be up to chair as much as possible, incentive spirometer. Per ID since she has improved and wbc normalized can complete a total 7 days of IV antibiotic (ceftazidime)  11/18: much more alert today after being on BiPAP last night.      Consultants:  Cardiology  Infectious disease   Procedures/Surgeries: none      ASSESSMENT & PLAN:   Cellulitis of the left lower extremity. Present on admission and ruled out DVT (though US  exam somewhat limited) (+)Pseudomonas bacteremia Dc cefepime d/t myoclonus / neuro effects. Continue on ceftazidime.  Per ID since she has improved and wbc normalized can complete a total 7 days of IV antibiotic  Monitor cultures --> initial cultures 11/11 sensitive to ceftazidime, repeat cultures 11/14 are negative thus far  trend leukocyte count and fever curve.   ID consult for bacteremia - likely cellulitis but eval lungs as well may need CT    HFpEF Exacerbation d/t SVT + fluid tx per sepsis protocol  Echo noted preserved EF but unable to assess diastolic fxn, likely is dysfunctional. No significant valve dysfunction.  Diuresing Lasix  IV 60 mg bid  Strict I&O Careful monitoring BMP / renal fxn --> improving  Cardiology following   SVT:  Amiodarone po  Eliquis 2.5 mg bid  Cardiology to follow  Echo as above       Acte on Chronic hypoxic/hypercarbic respiratory failure secondary to COPD and OSA and HFpEF, suspect restrictive disease / obesity hypoventilation syndrome Resp support was escalated to BIPAP and now weaned down to [place] but requiring BiPAP at night Pt has been resistant to sitting upright to allow better chest expansion but was able to sit in chair awhile today  Incentive  spirometry encouraged   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   Midodrine dc today hold antihypertensives. Cardiology following    Type 2 diabetes Monitor glc Relatively low glc, will hold on SSI for now   Hyperlipidemia:  statin   Chronic venous stasis Rec follow w/ lymphedema clinic   Skin nodularity bilateral lower extremities Question hyperkeratotic lesions? Keloid type lesions?  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 Discussed I support no CPR / intubation, should she be weak enough to experience cardiopulmonary arrest. 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  Underweight - under 18  overweight - 25 to 29 obese - 30 or more Class 1 obesity: BMI  of 30.0 to 34 Class 2 obesity: BMI of 35.0 to 39 Class 3 obesity: BMI of 40.0 to 49 Super Morbid Obesity: BMI 50-59 Super-super Morbid Obesity: BMI 60+ Healthy nutrition and physical activity advised as adjunct to other disease management and risk reduction treatments    DVT prophylaxis: lovenox  IV fluids:dc Nutrition: dysphagia diet for now  Unumprovident / other devices: none  Code Status: DNR per MOST form which was completed by the patient  ACP documentation reviewed: none on file in [facility]  [facility] needs: SNF  Medical barriers to dispo: IV abx and bacteremia. Hypercarbia requiring BiPAP. Expected medical readiness for discharge 1-2 days             Subjective / Brief ROS:  Patient reports dry mouth States she doesn't want to wear the BiPAP  Awakens to voice, minimal responses  Denies CP Reports feels about same as yesterday   Family Communication: called son 02/02/24 4:59 PM updted on plan all questions answered          Objective Findings:  Vitals:   02/01/24 2245 02/02/24 0500 02/02/24 0752 02/02/24 1200  BP:      Pulse: 75     Resp: 12     Temp:   97.6 F (36.4 C) 97.9 F (36.6 C)  TempSrc:   Oral Oral  SpO2: 94%     Weight:  124.8 kg    Height:       No intake or output data in the 24 hours ending 02/02/24 1649  Filed Weights   01/31/24 0430 02/01/24 0500 02/02/24 0500  Weight: 130.6 kg 130.4 kg 124.8 kg    Examination:  Physical Exam Constitutional:      General: She is not in acute distress.    Appearance: She is not ill-appearing.     Comments: Alerts to voice but groggy and giving one-word answers  Cardiovascular:     Rate and Rhythm: Normal rate and regular rhythm.  Pulmonary:     Effort: Pulmonary effort is normal. No tachypnea or respiratory distress.     Breath sounds: Decreased air movement present.  Musculoskeletal:     Comments: Bilateral LE wraps were not disturbed, no proximal pitting edema or erythema   Skin:     Comments: See photos  Neurological:     Mental Status: She is alert and oriented to person, place, and time. Mental status is at baseline.  Psychiatric:        Mood and Affect: Mood normal.        Behavior: Behavior normal.       01/27/24 - erythema improved     01/26/24     Scheduled Medications:   amiodarone  400 mg Oral BID   Followed by   [name] ON 02/05/2024] amiodarone  200 mg Oral Daily   amLODipine  5 mg Oral Daily   apixaban  2.5 mg Oral BID   budesonide-glycopyrrolate-formoterol  2 puff Inhalation BID   furosemide   60 mg Oral Daily   lactose free nutrition  237 mL Oral TID WC   pantoprazole   40 mg Oral Daily   polyethylene glycol  17 g Oral Daily   pravastatin  20 mg Oral QPM   sodium chloride  flush  3 mL Intravenous Q12H    Continuous Infusions:    PRN Medications:  acetaminophen  **OR** acetaminophen , bisacodyl , dextrose, HYDROmorphone (DILAUDID) injection, ondansetron  (ZOFRAN ) IV, senna-docusate  Antimicrobials from admission:  Anti-infectives (From admission, onward)    Start     Dose/Rate Route Frequency Ordered Stop   01/29/24 1000  cefTAZidime (FORTAZ) 2 g in sodium chloride  0.9 % 100 mL IVPB  Status:  Discontinued        2 g 200 mL/hr over 30 Minutes Intravenous Every 24 hours 01/28/24 1651 02/02/24 1648   01/28/24 1600  vancomycin (VANCOCIN) IVPB 1000 mg/200 mL premix  Status:  Discontinued       Placed in Followed by Linked Group   1,000 mg 200 mL/hr over 60 Minutes Intravenous Every 36 hours 01/27/24 0241 01/27/24 1045   01/27/24 2200  linezolid (ZYVOX) IVPB 600 mg  Status:  Discontinued        600 mg 300 mL/hr over 60 Minutes Intravenous Every 12 hours 01/27/24 1046 01/28/24 1618   01/27/24 1200  ceFEPIme (MAXIPIME) 2 g in sodium chloride  0.9 % 100 mL IVPB  Status:  Discontinued        2 g 200 mL/hr over 30 Minutes Intravenous Every 24 hours 01/27/24 0228 01/28/24 1651   01/27/24 0400  vancomycin (VANCOREADY) IVPB 2000 mg/400 mL        Placed in Followed  by Linked Group   2,000 mg 200 mL/hr over 120 Minutes Intravenous  Once 01/27/24 0241 01/27/24 0713   01/27/24 0100  ceFAZolin (ANCEF) IVPB 2g/100 mL premix  Status:  Discontinued        2 g 200 mL/hr over 30 Minutes Intravenous Every 8 hours 01/27/24 0037 01/27/24 0042   01/26/24 1300  ceFEPIme (MAXIPIME) 2 g in sodium chloride  0.9 % 100 mL IVPB        2 g 200 mL/hr over 30 Minutes Intravenous  Once 01/26/24 1256 01/26/24 1350   01/26/24 1300  metroNIDAZOLE (FLAGYL) IVPB 500 mg        500 mg 100 mL/hr over 60 Minutes Intravenous  Once 01/26/24 1256 01/26/24 1559   01/26/24 1300  vancomycin (VANCOCIN) IVPB 1000 mg/200 mL premix        1,000 mg 200 mL/hr over 60 Minutes Intravenous  Once 01/26/24 1256 01/26/24 1458           Data Reviewed:  I have personally reviewed the following...  CBC: Recent Labs  Lab 01/27/24 0456 01/28/24 0409 01/29/24 0438 01/30/24 0411 02/02/24 0353  WBC 24.0* 21.6* 16.8* 11.3* 7.3  HGB 8.7* 8.1* 8.2* 9.0* 9.8*  HCT 29.4* 27.3* 28.0* 28.7* 33.0*  MCV 96.4 96.5 95.2 91.4 94.3  PLT 249 232 248 256 288   Basic Metabolic Panel: Recent Labs  Lab 01/29/24 0438 01/30/24 0411 01/31/24 0557 02/01/24 0306 02/02/24 0353  NA 139 142 144 141 136  K 4.7 4.4 4.2 4.2 4.1  CL 99 99 97* 93* 86*  CO2 27 29 35* 40* 41*  GLUCOSE 85 65* 83 94 107*  BUN 65* 64* 65* 62* 63*  CREATININE 2.86* 2.79* 2.32* 2.15* 2.56*  CALCIUM  9.7 10.1 10.3 10.3 10.3   GFR: Estimated Creatinine Clearance: 22.1 mL/min (A) (by C-G formula based on SCr of 2.56 mg/dL (H)). Liver Function Tests: No results for input(s): AST, ALT, ALKPHOS, BILITOT, PROT, ALBUMIN in the last 168 hours.  No results for input(s): LIPASE, AMYLASE in the last 168 hours. No results for input(s): AMMONIA in the last 168 hours. Coagulation Profile: No results for input(s): INR, PROTIME in the last 168 hours.  Cardiac Enzymes: No results for input(s):  CKTOTAL, CKMB, CKMBINDEX, TROPONINI in the last 168 hours. BNP (last 3 results) Recent Labs    01/26/24 1256 01/28/24 0409  PROBNP 121.0 9,777.0*   HbA1C: No results for input(s): HGBA1C in the last 72 hours. CBG: Recent Labs  Lab 02/01/24 1226 02/01/24 1812 02/01/24 2104 02/02/24 0755 02/02/24 1158  GLUCAP 135* 106* 123* 89 139*   Lipid Profile: No results for input(s): CHOL, HDL, LDLCALC, TRIG, CHOLHDL, LDLDIRECT in the last 72 hours. Thyroid Function Tests: No results for input(s): TSH, T4TOTAL, FREET4, T3FREE, THYROIDAB in the last 72 hours.  Anemia Panel: No results for input(s): VITAMINB12, FOLATE, FERRITIN, TIBC, IRON, RETICCTPCT in the last 72 hours. Most Recent Urinalysis On File:     Component Value Date/Time   COLORURINE STRAW (A) 01/26/2024 1430   APPEARANCEUR CLEAR (A) 01/26/2024 1430   APPEARANCEUR Cloudy 10/14/2012 2100   LABSPEC 1.009 01/26/2024 1430   LABSPEC 1.016 10/14/2012 2100   PHURINE 5.0 01/26/2024 1430   GLUCOSEU 150 (A) 01/26/2024 1430   GLUCOSEU Negative 10/14/2012 2100   HGBUR NEGATIVE 01/26/2024 1430   BILIRUBINUR NEGATIVE 01/26/2024 1430   BILIRUBINUR Negative 10/14/2012 2100   KETONESUR NEGATIVE 01/26/2024 1430   PROTEINUR 100 (A) 01/26/2024 1430   NITRITE NEGATIVE 01/26/2024 1430  LEUKOCYTESUR NEGATIVE 01/26/2024 1430   LEUKOCYTESUR Negative 10/14/2012 2100   Sepsis Labs: @LABRCNTIP (procalcitonin:4,lacticidven:4) Microbiology: Recent Results (from the past 240 hours)  Resp panel by RT-PCR (RSV, Flu A&B, Covid) Anterior Nasal Swab     Status: None   Collection Time: 01/26/24 12:56 PM   Specimen: Anterior Nasal Swab  Result Value Ref Range Status   SARS Coronavirus 2 by RT PCR NEGATIVE NEGATIVE Final    Comment: (NOTE) SARS-CoV-2 target nucleic acids are NOT DETECTED.  The SARS-CoV-2 RNA is generally detectable in upper respiratory specimens during the acute phase of infection. The  lowest concentration of SARS-CoV-2 viral copies this assay can detect is 138 copies/mL. A negative result does not preclude SARS-Cov-2 infection and should not be used as the sole basis for treatment or other patient management decisions. A negative result may occur with  improper specimen collection/handling, submission of specimen other than nasopharyngeal swab, presence of viral mutation(s) within the areas targeted by this assay, and inadequate number of viral copies(<138 copies/mL). A negative result must be combined with clinical observations, patient history, and epidemiological information. The expected result is Negative.  Fact Sheet for Patients:  bloggercourse.com  Fact Sheet for Healthcare Providers:  seriousbroker.it  This test is no t yet approved or cleared by the [place]  FDA and  has been authorized for detection and/or diagnosis of SARS-CoV-2 by FDA under an Emergency Use Authorization (EUA). This EUA will remain  in effect (meaning this test can be used) for the duration of the COVID-19 declaration under Section 564(b)(1) of the Act, 21 U.S.C.section 360bbb-3(b)(1), unless the authorization is terminated  or revoked sooner.       Influenza A by PCR NEGATIVE NEGATIVE Final   Influenza B by PCR NEGATIVE NEGATIVE Final    Comment: (NOTE) The Xpert Xpress SARS-CoV-2/FLU/RSV plus assay is intended as an aid in the diagnosis of influenza from Nasopharyngeal swab specimens and should not be used as a sole basis for treatment. Nasal washings and aspirates are unacceptable for Xpert Xpress SARS-CoV-2/FLU/RSV testing.  Fact Sheet for Patients: bloggercourse.com  Fact Sheet for Healthcare Providers: seriousbroker.it  This test is not yet approved or cleared by the [place]  FDA and has been authorized for detection and/or diagnosis of SARS-CoV-2 by FDA under  an Emergency Use Authorization (EUA). This EUA will remain in effect (meaning this test can be used) for the duration of the COVID-19 declaration under Section 564(b)(1) of the Act, 21 U.S.C. section 360bbb-3(b)(1), unless the authorization is terminated or revoked.     Resp Syncytial Virus by PCR NEGATIVE NEGATIVE Final    Comment: (NOTE) Fact Sheet for Patients: bloggercourse.com  Fact Sheet for Healthcare Providers: seriousbroker.it  This test is not yet approved or cleared by the [place]  FDA and has been authorized for detection and/or diagnosis of SARS-CoV-2 by FDA under an Emergency Use Authorization (EUA). This EUA will remain in effect (meaning this test can be used) for the duration of the COVID-19 declaration under Section 564(b)(1) of the Act, 21 U.S.C. section 360bbb-3(b)(1), unless the authorization is terminated or revoked.  Performed at [facility], [address] [place], [place] [address]   Blood Culture (routine x 2)     Status: Abnormal   Collection Time: 01/26/24 12:56 PM   Specimen: BLOOD  Result Value Ref Range Status   Specimen Description   Final    BLOOD BLOOD RIGHT FOREARM Performed at [facility], [address], [place], [place] [address]  Special Requests   Final    BOTTLES DRAWN AEROBIC AND ANAEROBIC Blood Culture results may not be optimal due to an inadequate volume of blood received in culture bottles Performed at [facility], [address], [place], [place] [address]    Culture  Setup Time   Final    GRAM NEGATIVE RODS AEROBIC BOTTLE ONLY CRITICAL VALUE NOTED.  VALUE IS CONSISTENT WITH PREVIOUSLY REPORTED AND CALLED VALUE. Performed at [facility], [address] [place], [place] [address]    Culture (A)  Final    PSEUDOMONAS AERUGINOSA SUSCEPTIBILITIES PERFORMED ON PREVIOUS CULTURE WITHIN THE LAST 5 DAYS. Performed at [facility], [address] [place], [place] [address]    Report Status 01/29/2024 FINAL  Final  Blood Culture (routine x 2)     Status: Abnormal   Collection Time: 01/26/24  1:01 PM   Specimen: BLOOD  Result Value Ref Range Status   Specimen Description   Final    BLOOD RIGHT ARM Performed at [facility], [address], [place], [place] [address]    Special Requests   Final    BOTTLES DRAWN AEROBIC AND ANAEROBIC Blood Culture adequate volume Performed at [facility], [address] [place], [place] [address]    Culture  Setup Time   Final    GRAM NEGATIVE RODS AEROBIC BOTTLE ONLY CRITICAL RESULT CALLED TO, READ BACK BY AND VERIFIED WITH:  NATHAN BELUE AT 0541 01/27/24 JG Performed at [facility], [address], [place], [place] [address]    Culture PSEUDOMONAS AERUGINOSA (A)  Final   Report Status 01/29/2024 FINAL  Final   Organism ID, Bacteria PSEUDOMONAS AERUGINOSA  Final      Susceptibility   Pseudomonas aeruginosa - MIC*    MEROPENEM INTERMEDIATE Intermediate     CIPROFLOXACIN 0.5 SENSITIVE Sensitive     IMIPENEM >=16 RESISTANT Resistant     PIP/TAZO Value in next row Sensitive      8 SENSITIVEThis is a modified FDA-approved test that has been validated and its performance characteristics determined by the reporting laboratory.  This laboratory is certified under the Clinical Laboratory Improvement Amendments CLIA as qualified to perform high complexity clinical laboratory testing.    CEFEPIME Value in next row Sensitive      8 SENSITIVEThis is a modified FDA-approved test that has been validated and its performance characteristics determined by the reporting laboratory.  This laboratory is certified under the Clinical Laboratory Improvement Amendments CLIA as qualified to perform high complexity clinical laboratory testing.    CEFTAZIDIME/AVIBACTAM Value in next row Sensitive      8 SENSITIVEThis is a modified FDA-approved test that has been  validated and its performance characteristics determined by the reporting laboratory.  This laboratory is certified under the Clinical Laboratory Improvement Amendments CLIA as qualified to perform high complexity clinical laboratory testing.    CEFTOLOZANE/TAZOBACTAM Value in next row Sensitive      8 SENSITIVEThis is a modified FDA-approved test that has been validated and its performance characteristics determined by the reporting laboratory.  This laboratory is certified under the Clinical Laboratory Improvement Amendments CLIA as qualified to perform high complexity clinical laboratory testing.    TOBRAMYCIN Value in next row Sensitive      8 SENSITIVEThis is a modified FDA-approved test that has been validated and its performance characteristics determined by the reporting laboratory.  This laboratory is certified under the Clinical Laboratory Improvement Amendments CLIA as qualified to perform  high complexity clinical laboratory testing.    CEFTAZIDIME Value in next row Sensitive      8 SENSITIVEThis is a modified FDA-approved test that has been validated and its performance characteristics determined by the reporting laboratory.  This laboratory is certified under the Clinical Laboratory Improvement Amendments CLIA as qualified to perform high complexity clinical laboratory testing.    * PSEUDOMONAS AERUGINOSA  Blood Culture ID Panel (Reflexed)     Status: Abnormal   Collection Time: 01/26/24  1:01 PM  Result Value Ref Range Status   Enterococcus faecalis NOT DETECTED NOT DETECTED Final   Enterococcus Faecium NOT DETECTED NOT DETECTED Final   Listeria monocytogenes NOT DETECTED NOT DETECTED Final   Staphylococcus species NOT DETECTED NOT DETECTED Final   Staphylococcus aureus (BCID) NOT DETECTED NOT DETECTED Final   Staphylococcus epidermidis NOT DETECTED NOT DETECTED Final   Staphylococcus lugdunensis NOT DETECTED NOT DETECTED Final   Streptococcus species NOT DETECTED NOT DETECTED Final    Streptococcus agalactiae NOT DETECTED NOT DETECTED Final   Streptococcus pneumoniae NOT DETECTED NOT DETECTED Final   Streptococcus pyogenes NOT DETECTED NOT DETECTED Final   A.calcoaceticus-baumannii NOT DETECTED NOT DETECTED Final   Bacteroides fragilis NOT DETECTED NOT DETECTED Final   Enterobacterales NOT DETECTED NOT DETECTED Final   Enterobacter cloacae complex NOT DETECTED NOT DETECTED Final   Escherichia coli NOT DETECTED NOT DETECTED Final   Klebsiella aerogenes NOT DETECTED NOT DETECTED Final   Klebsiella oxytoca NOT DETECTED NOT DETECTED Final   Klebsiella pneumoniae NOT DETECTED NOT DETECTED Final   Proteus species NOT DETECTED NOT DETECTED Final   Salmonella species NOT DETECTED NOT DETECTED Final   Serratia marcescens NOT DETECTED NOT DETECTED Final   Haemophilus influenzae NOT DETECTED NOT DETECTED Final   Neisseria meningitidis NOT DETECTED NOT DETECTED Final   Pseudomonas aeruginosa DETECTED (A) NOT DETECTED Final    Comment: CRITICAL RESULT CALLED TO, READ BACK BY AND VERIFIED WITH:  NATHAN BELUE AT 0541 01/27/24 JG    Stenotrophomonas maltophilia NOT DETECTED NOT DETECTED Final   Candida albicans NOT DETECTED NOT DETECTED Final   Candida auris NOT DETECTED NOT DETECTED Final   Candida glabrata NOT DETECTED NOT DETECTED Final   Candida krusei NOT DETECTED NOT DETECTED Final   Candida parapsilosis NOT DETECTED NOT DETECTED Final   Candida tropicalis NOT DETECTED NOT DETECTED Final   Cryptococcus neoformans/gattii NOT DETECTED NOT DETECTED Final   CTX-M ESBL NOT DETECTED NOT DETECTED Final   Carbapenem resistance IMP NOT DETECTED NOT DETECTED Final   Carbapenem resistance KPC NOT DETECTED NOT DETECTED Final   Carbapenem resistance NDM NOT DETECTED NOT DETECTED Final   Carbapenem resistance VIM NOT DETECTED NOT DETECTED Final    Comment: Performed at [facility], [address] [place], [place] [address]  Culture, blood (Routine X 2) w Reflex to ID  Panel     Status: None (Preliminary result)   Collection Time: 01/29/24  4:38 AM   Specimen: BLOOD  Result Value Ref Range Status   Specimen Description BLOOD BLOOD LEFT ARM  Final   Special Requests   Final    BOTTLES DRAWN AEROBIC AND ANAEROBIC Blood Culture adequate volume   Culture   Final    NO GROWTH 4 DAYS Performed at [facility], [address] [place], [place] [address]    Report Status PENDING  Incomplete  Culture, blood (Routine X 2) w Reflex to ID Panel     Status: None (Preliminary result)   Collection Time:  01/29/24  4:43 AM   Specimen: BLOOD  Result Value Ref Range Status   Specimen Description BLOOD BLOOD LEFT HAND  Final   Special Requests   Final    BOTTLES DRAWN AEROBIC AND ANAEROBIC Blood Culture adequate volume   Culture   Final    NO GROWTH 4 DAYS Performed at [facility], [address], [place], [place] [address]    Report Status PENDING  Incomplete      Radiology Studies last 3 days: No results found.       Lorali Khamis, DO [facility] 02/02/2024, 4:49 PM    Dictation software may have been used to generate the above note. Typos may occur and escape review in typed/dictated notes. Please contact Dr Marsa directly for clarity if needed.  Staff may message me via secure chat in Epic  but this may not receive an immediate response,  please page me for urgent matters!  If 7PM-7AM, please contact night coverage [email or web address]

## 2024-02-02 NOTE — Progress Notes (Signed)
 Physical Therapy Treatment Patient Details Name: Kelly Hogan MRN: [id number] DOB: 1941-03-11 Today's Date: 02/02/2024   History of Present Illness Kelly Hogan is a 83 y.o. 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 She was placed on BiPAP and uses 4L at baseline.    PT Comments  Limited session d/t pt requiring peri care/hygiene. Focus on bed mobility and increasing pt independence with rolling. Pt continues to require max A x 2 to roll L and R. Pt able to attempt to reach for bed rails however her LUE is still has limited ROM and limited by pain. Total A for peri care. Pt on 4L of O2 throughout session; SpO2 remained WNL. Will continue to follow pt acutely to promote strength and activity tolerance. Pt able to tolerate hoyer lift transfer well. Discharge disposition remains appropriate.     If plan is discharge home, recommend the following: Two people to help with walking and/or transfers;Two people to help with bathing/dressing/bathroom;Assist for transportation   Can travel by private vehicle     No  Equipment Recommendations  Other (comment) (TBD at next venue)    Recommendations for Other Services       Precautions / Restrictions Precautions Precautions: Fall Recall of Precautions/Restrictions: Impaired Restrictions Weight Bearing Restrictions Per Provider Order: No     Mobility  Bed Mobility Overal bed mobility: Needs Assistance Bed Mobility: Rolling Rolling: Max assist, +2 for physical assistance         General bed mobility comments: Pt able to use bed rails to assist with rolling. LUE still limited by pain and with limited ROM    Transfers Overall transfer level: Needs assistance                  General transfer comment: Deitra transfer to chair. Transfer via Lift Equipment: Maxisky  Ambulation/Gait               General Gait Details: NT d/t pt fatiguing after performing peri care.   Stairs             Wheelchair Mobility     Tilt Bed    Modified Rankin (Stroke Patients Only)       Balance                                            Communication Communication Communication: Impaired Factors Affecting Communication: Reduced clarity of speech  Cognition Arousal: Alert Behavior During Therapy: WFL for tasks assessed/performed   PT - Cognitive impairments: No apparent impairments                       PT - Cognition Comments: Pleasant and agreeable to PT session. Pt much more alert this session and more conversational. Following commands: Intact      Cueing Cueing Techniques: Verbal cues, Visual cues  Exercises Other Exercises Other Exercises: Assistance with rolling to perform pericare. Total A for peri care    General Comments General comments (skin integrity, edema, etc.): Pt on 4L of O2 throughout session. WNL.      Pertinent Vitals/Pain Pain Assessment Pain Assessment: No/denies pain  Home Living                          Prior Function            PT Goals (current goals can now be found in the care plan section) Acute Rehab PT Goals PT Goal Formulation: With patient Time For Goal Achievement: 02/12/24 Potential to Achieve Goals: Fair Progress towards PT goals: Progressing toward goals    Frequency    Min 2X/week      PT Plan      Co-evaluation              AM-PAC PT 6 Clicks Mobility   Outcome Measure  Help needed turning from your back to your side while in a flat bed without using bedrails?: Total Help needed moving from lying on your back to sitting on the side of a flat bed without using bedrails?: A Lot Help needed moving to and from a bed to a chair (including  a wheelchair)?: Total Help needed standing up from a chair using your arms (e.g., wheelchair or bedside chair)?: A Lot Help needed to walk in hospital room?: Total Help needed climbing 3-5 steps with a railing? : Total 6 Click Score: 8    End of Session Equipment Utilized During Treatment: Oxygen       PT Visit Diagnosis: Muscle weakness (generalized) (M62.81);Other abnormalities of gait and mobility (R26.89)     Time: 8879-8847 PT Time Calculation (min) (ACUTE ONLY): 32 min  Charges:                            Jansel Vonstein, SPT    Tallula Grindle 02/02/2024, 2:30 PM

## 2024-02-02 NOTE — TOC Initial Note (Signed)
 [facility]) - Initial/Assessment Note    Patient Details  Name: Kelly Hogan MRN: [id number] Date of Birth: March 11, 1941  [facility] CM/SW Contact:    Kelly Hogan Phone Number: 02/02/2024, 3:31 PM  Clinical Narrative:                  Kelly Hogan met with patient and son at bedside. Pt and son accepted bed offer at [facility]. [facility] pt's plan is to return home with son and additional family support.   Expected Discharge Plan: Skilled Nursing Facility Barriers to Discharge: Continued Medical Work up   Patient Goals and CMS Choice            Expected Discharge Plan and Services       Living arrangements for the past 2 months: Single Family Home                                      Prior Living Arrangements/Services Living arrangements for the past 2 months: Single Family Home Lives with:: Self, Adult Children Patient language and need for interpreter reviewed:: Yes Do you feel safe going back to the place where you live?: Yes      Need for Family Participation in Patient Hogan: Yes (Comment) Hogan giver support system in place?: Yes (comment) Current home services: DME Criminal Activity/Legal Involvement Pertinent to Current Situation/Hospitalization: No - Comment as needed  Activities of Daily Living   ADL Screening (condition at time of admission) Independently performs ADLs?: No Does the patient have a NEW difficulty with bathing/dressing/toileting/self-feeding that is expected to last >3 days?: Yes (Initiates electronic notice to provider for possible OT consult) Does the patient have a NEW difficulty with getting in/out of bed, walking, or climbing stairs that is expected to last >3 days?: Yes (Initiates electronic notice to provider for possible PT consult) Does the patient have a NEW difficulty with communication that is expected to last >3 days?: Yes (Initiates electronic notice to  provider for possible SLP consult) Is the patient deaf or have difficulty hearing?: No Does the patient have difficulty seeing, even when wearing glasses/contacts?: No Does the patient have difficulty concentrating, remembering, or making decisions?: No  Permission Sought/Granted                  Emotional Assessment Appearance:: Appears stated age Attitude/Demeanor/Rapport: Gracious, Engaged Affect (typically observed): Calm, Accepting, Quiet, Pleasant Orientation: : Oriented to Self, Oriented to Place, Oriented to  Time, Oriented to Situation Alcohol / Substance Use: Not Applicable Psych Involvement: No (comment)  Admission diagnosis:  SVT (supraventricular tachycardia) [I47.10] Cellulitis of left lower extremity [L03.116] Sepsis due to cellulitis (HCC) [L03.90, A41.9] Sepsis, due to unspecified organism, unspecified whether acute organ dysfunction present [facility]) [A41.9] Patient Active Problem List   Diagnosis Date Noted   Cellulitis of left lower extremity 02/02/2024   Bacteremia due to Pseudomonas 01/29/2024   CO2 narcosis 01/29/2024   Metabolic encephalopathy 01/29/2024   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   Sepsis due to cellulitis (HCC) 01/26/2024   Dysuria 12/17/2022   Swelling of lower extremity 07/17/2022   Acute on chronic diastolic congestive heart failure (HCC) 02/09/2020   (HFpEF) heart failure with preserved ejection fraction (HCC) 12/21/2019   Hypervolemia 10/18/2019   AKI (acute kidney injury) 10/14/2019   Pulmonary edema 10/14/2019   OSA (obstructive sleep apnea) 07/17/2019  Obesity hypoventilation syndrome (HCC) 07/17/2019   Acute on chronic respiratory failure with hypoxia (HCC) 07/13/2019   Morbid obesity with BMI of 50.0-59.9, adult (HCC) 07/13/2019   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/2019   HLD (hyperlipidemia) 07/07/2019   CKD (chronic kidney disease), stage IIIa 07/07/2019   Tobacco abuse 07/07/2019    Generalized weakness 07/07/2019   Hyponatremia 07/07/2019   Hyperkalemia 07/07/2019   Normocytic anemia 07/07/2019   Cellulitis and abscess of toe 03/17/2019   Morbid obesity (HCC) 10/17/2015   Acute low back pain 05/22/2015   Gastric ulcer 09/26/2014   Osteopenia 09/07/2013   H/O adenomatous polyp of colon 03/20/2013   COPD (chronic obstructive pulmonary disease) (HCC) 07/05/2012   Acid reflux 05/04/2012   Chronic respiratory failure (HCC) 11/25/2010   Chronic venous insufficiency 01/29/2010   Type 2 diabetes mellitus (HCC) 11/07/2008   Airway hyperreactivity 11/30/1996   Essential (primary) hypertension 11/30/1996   PCP:  [facility]:   Kelly Hogan [address]), [place] - [address] Kelly Hogan) [place] [address] Phone: [phone] Fax: [phone]  [facility] [address] [place] [address] Phone: [phone] Fax: [phone]     Social Drivers of Health (SDOH) Social History: SDOH Screenings   Food Insecurity: Patient Declined (01/27/2024)  Housing: Patient Declined (01/27/2024)  Transportation Needs: Patient Declined (01/27/2024)  Utilities: Patient Declined (01/27/2024)  Financial Resource Strain: Low Risk (08/12/2023)   Received from [facility]  Social Connections: Patient Declined (01/27/2024)  Stress: No Stress Concern Present (11/17/2023)   [facility]  Tobacco Use: Medium Risk (12/21/2023)   Received from [facility] Literacy: Low Risk (07/06/2023)   Received from [facility]   SDOH Interventions:     Readmission Risk Interventions     No data to display

## 2024-02-02 NOTE — Plan of Care (Signed)
  Problem: Fluid Volume: Goal: Hemodynamic stability will improve Outcome: Progressing   Problem: Clinical Measurements: Goal: Diagnostic test results will improve Outcome: Progressing Goal: Signs and symptoms of infection will decrease Outcome: Progressing   Problem: Respiratory: Goal: Ability to maintain adequate ventilation will improve Outcome: Progressing   Problem: Education: Goal: Knowledge of General Education information will improve Description: Including pain rating scale, medication(s)/side effects and non-pharmacologic comfort measures Outcome: Progressing   Problem: Health Behavior/Discharge Planning: Goal: Ability to manage health-related needs will improve Outcome: Progressing   Problem: Clinical Measurements: Goal: Ability to maintain clinical measurements within normal limits will improve Outcome: Progressing Goal: Will remain free from infection Outcome: Progressing Goal: Diagnostic test results will improve Outcome: Progressing Goal: Respiratory complications will improve Outcome: Progressing Goal: Cardiovascular complication will be avoided Outcome: Progressing   Problem: Activity: Goal: Risk for activity intolerance will decrease Outcome: Progressing   Problem: Nutrition: Goal: Adequate nutrition will be maintained Outcome: Progressing   Problem: Coping: Goal: Level of anxiety will decrease Outcome: Progressing   Problem: Elimination: Goal: Will not experience complications related to bowel motility Outcome: Progressing Goal: Will not experience complications related to urinary retention Outcome: Progressing   Problem: Pain Managment: Goal: General experience of comfort will improve and/or be controlled Outcome: Progressing   Problem: Safety: Goal: Ability to remain free from injury will improve Outcome: Progressing   Problem: Skin Integrity: Goal: Risk for impaired skin integrity will decrease Outcome: Progressing   Problem:  Clinical Measurements: Goal: Ability to avoid or minimize complications of infection will improve Outcome: Progressing   Problem: Skin Integrity: Goal: Skin integrity will improve Outcome: Progressing

## 2024-02-03 ENCOUNTER — Other Ambulatory Visit (HOSPITAL_COMMUNITY): Payer: Self-pay

## 2024-02-03 DIAGNOSIS — A419 Sepsis, unspecified organism: Secondary | ICD-10-CM | POA: Diagnosis not present

## 2024-02-03 DIAGNOSIS — L0390 Cellulitis, unspecified: Secondary | ICD-10-CM | POA: Diagnosis not present

## 2024-02-03 LAB — BASIC METABOLIC PANEL WITH GFR
Anion gap: 11 (ref 5–15)
BUN: 71 mg/dL — ABNORMAL HIGH (ref 8–23)
CO2: 38 mmol/L — ABNORMAL HIGH (ref 22–32)
Calcium: 10.1 mg/dL (ref 8.9–10.3)
Chloride: 87 mmol/L — ABNORMAL LOW (ref 98–111)
Creatinine, Ser: 2.94 mg/dL — ABNORMAL HIGH (ref 0.44–1.00)
GFR, Estimated: 15 mL/min — ABNORMAL LOW (ref >60)
Glucose, Bld: 123 mg/dL — ABNORMAL HIGH (ref 70–99)
Potassium: 4.8 mmol/L (ref 3.5–5.1)
Sodium: 136 mmol/L (ref 135–145)

## 2024-02-03 LAB — CBC
HCT: 33.7 % — ABNORMAL LOW (ref 36.0–46.0)
Hemoglobin: 10.2 g/dL — ABNORMAL LOW (ref 12.0–15.0)
MCH: 28.3 pg (ref 26.0–34.0)
MCHC: 30.3 g/dL (ref 30.0–36.0)
MCV: 93.4 fL (ref 80.0–100.0)
Platelets: 300 K/uL (ref 150–400)
RBC: 3.61 MIL/uL — ABNORMAL LOW (ref 3.87–5.11)
RDW: 13 % (ref 11.5–15.5)
WBC: 6.7 K/uL (ref 4.0–10.5)
nRBC: 0 % (ref 0.0–0.2)

## 2024-02-03 LAB — BLOOD GAS, ARTERIAL
Acid-Base Excess: 16.7 mmol/L — ABNORMAL HIGH (ref 0.0–2.0)
Bicarbonate: 43.2 mmol/L — ABNORMAL HIGH (ref 20.0–28.0)
O2 Content: 4 L/min
O2 Saturation: 99.1 %
Patient temperature: 37
pCO2 arterial: 58 mmHg — ABNORMAL HIGH (ref 32–48)
pH, Arterial: 7.48 — ABNORMAL HIGH (ref 7.35–7.45)
pO2, Arterial: 97 mmHg (ref 83–108)

## 2024-02-03 LAB — CULTURE, BLOOD (ROUTINE X 2)
Culture: NO GROWTH
Culture: NO GROWTH
Special Requests: ADEQUATE
Special Requests: ADEQUATE

## 2024-02-03 LAB — GLUCOSE, CAPILLARY
Glucose-Capillary: 161 mg/dL — ABNORMAL HIGH (ref 70–99)
Glucose-Capillary: 110 mg/dL — ABNORMAL HIGH (ref 70–99)
Glucose-Capillary: 139 mg/dL — ABNORMAL HIGH (ref 70–99)
Glucose-Capillary: 163 mg/dL — ABNORMAL HIGH (ref 70–99)

## 2024-02-03 MED ORDER — FUROSEMIDE 40 MG PO TABS: 60.0000 mg | ORAL_TABLET | Freq: Every day | ORAL | Status: DC

## 2024-02-03 MED ORDER — SALINE SPRAY 0.65 % NA SOLN
1.0000 | NASAL | Status: DC | PRN
  Administered 2024-02-05: 1 via NASAL
  Filled 2024-02-03: qty 44

## 2024-02-03 NOTE — Progress Notes (Signed)
 [facility] CLINIC CARDIOLOGY PROGRESS NOTE       Patient ID: Kelly Hogan MRN: [id number] DOB/AGE: 09-05-1940 83 y.o.  Admit date: 01/26/2024 Referring Physician Dr. Laneta Blunt Primary Physician [name], [name] LABOR, MD  Primary Cardiologist Damien Lee, NP Reason for Consultation AF, bacteremia  HPI: Kelly Hogan is a 83 y.o. female  with a past medical history of COPD, chronic respiratory failure on 4 L, hypertension, hyperlipidemia, type 2 diabetes, obesity, obesity hypoventilation syndrome, history of venous insufficiency who presented to the ED on 01/26/2024 for concern for leg infection.  Noted to be in atrial fibrillation RVR initially, placed on IV amiodarone  and ultimately converted to normal sinus rhythm.  Blood cultures were positive for Pseudomonas.  Cardiology was consulted for further evaluation.   Interval history: - Patient seen and examined this morning, resting comfortably in bed on supplemental O2. - More alert this AM. Has no complaints at the time of my evaluation.  - Remains in sinus rhythm.  Continues to tolerate p.o. amiodarone .  Review of systems complete and found to be negative unless listed above    Past Medical History:  Diagnosis Date   Asthma    COPD (chronic obstructive pulmonary disease) (HCC)    Diabetes mellitus without complication (HCC)    Hypertension    Morbid obesity with BMI of 50.0-59.9, adult (HCC) 07/13/2019   Stomach ulcer     No past surgical history on file.  Medications Prior to Admission  Medication Sig Dispense Refill Last Dose/Taking   acetaminophen  (TYLENOL ) 500 MG tablet Take 500-1,000 mg by mouth every 6 (six) hours as needed for mild pain or fever.    01/26/2024   albuterol  (PROVENTIL ) (2.5 MG/3ML) 0.083% nebulizer solution Take 3 mLs by nebulization every 6 (six) hours as needed for wheezing.   01/26/2024   aspirin  81 MG EC tablet Take 81 mg by mouth daily.   01/25/2024   carvedilol  (COREG ) 25 MG tablet Take 12.5  mg by mouth in the morning and at bedtime.   01/25/2024   Cholecalciferol  25 MCG (1000 UT) tablet Take 1,000 Units by mouth daily.   01/25/2024   ciclopirox (LOPROX) 0.77 % cream Apply 0.77 % topically 2 (two) times daily.   01/25/2024   diclofenac Sodium (VOLTAREN) 1 % GEL Apply 2 g topically 4 (four) times daily.   01/26/2024   docusate sodium  (COLACE) 100 MG capsule Take 100 mg by mouth 2 (two) times daily.  4 01/26/2024   empagliflozin (JARDIANCE) 10 MG TABS tablet Take 1 tablet by mouth daily.   01/25/2024   fluticasone  (FLONASE ) 50 MCG/ACT nasal spray Place 1 spray into both nostrils daily.    01/25/2024   gatifloxacin (ZYMAXID) 0.5 % SOLN Place 1 drop into the left eye 4 (four) times daily.   Past Month   ketorolac (ACULAR) 0.5 % ophthalmic solution Place 1 drop into the left eye 2 (two) times daily.   Unknown   Lidocaine 4 % PTCH Place onto the skin.   Past Month   olmesartan (BENICAR) 5 MG tablet Take 5 mg by mouth daily.   01/25/2024   omeprazole (PRILOSEC) 20 MG capsule Take 20 mg by mouth daily.   01/25/2024   Oyster Shell 500 MG TABS Take 500 mg by mouth in the morning, at noon, and at bedtime.   01/25/2024   pravastatin  (PRAVACHOL ) 20 MG tablet Take 20 mg by mouth daily.   01/25/2024   prednisoLONE acetate (PRED FORTE) 1 % ophthalmic suspension Place 1  drop into the left eye 4 (four) times daily.   Past Month   PROAIR  HFA 108 (90 Base) MCG/ACT inhaler Inhale 2 puffs into the lungs every 4 (four) hours as needed for wheezing or shortness of breath.    01/26/2024   senna (SENOKOT) 8.6 MG tablet Take 1 tablet by mouth daily.   01/25/2024   Torsemide 40 MG TABS Take 40 mg by mouth 2 (two) times daily.   01/25/2024   TRELEGY ELLIPTA 100-62.5-25 MCG/INH AEPB Take 1 puff by mouth daily.   01/25/2024   amLODipine  (NORVASC ) 5 MG tablet Take 5 mg by mouth daily. (Patient not taking: Reported on 01/26/2024)   Not Taking   ferrous sulfate 324 MG TBEC Take 324 mg by mouth.   Unknown    furosemide  (LASIX ) 40 MG tablet Take 40-80 mg by mouth as directed.  (Patient not taking: Reported on 01/26/2024)   Not Taking   hydrALAZINE  (APRESOLINE ) 25 MG tablet Take 25 mg by mouth 2 (two) times daily. (Patient not taking: Reported on 01/26/2024)   Not Taking   ipratropium-albuterol  (DUONEB) 0.5-2.5 (3) MG/3ML SOLN Take 3 mLs by nebulization every 6 (six) hours as needed (wheezing). (Patient not taking: Reported on 01/26/2024)   Not Taking   omeprazole (PRILOSEC) 20 MG capsule Take by mouth.      spironolactone  (ALDACTONE ) 25 MG tablet Take 25 mg by mouth daily. (Patient not taking: Reported on 01/26/2024)  3 Not Taking   telmisartan (MICARDIS) 80 MG tablet Take 80 mg by mouth daily. (Patient not taking: Reported on 01/26/2024)   Not Taking   Social History   Socioeconomic History   Marital status: Single    Spouse name: Not on file   Number of children: Not on file   Years of education: Not on file   Highest education level: Not on file  Occupational History   Not on file  Tobacco Use   Smoking status: Former    Types: Cigarettes   Smokeless tobacco: Never  Substance and Sexual Activity   Alcohol use: No   Drug use: No   Sexual activity: Not on file  Other Topics Concern   Not on file  Social History Narrative   Not on file   Social Drivers of Health   Financial Resource Strain: Low Risk (08/12/2023)   Received from [facility]   Overall Financial Resource Strain (CARDIA)    Difficulty of Paying Living Expenses: Not very hard  Food Insecurity: Patient Declined (01/27/2024)   Hunger Vital Sign    Worried About Running Out of Food in the Last Year: Patient declined    Ran Out of Food in the Last Year: Patient declined  Transportation Needs: Patient Declined (01/27/2024)   PRAPARE - Administrator, Civil Service (Medical): Patient declined    Lack of Transportation (Non-Medical): Patient declined  Physical Activity: Not on file  Stress: No Stress Concern  Present (11/17/2023)   Received from [facility] of Occupational Health - Occupational Stress Questionnaire    Do you feel stress - tense, restless, nervous, or anxious, or unable to sleep at night because your mind is troubled all the time - these days?: Only a little  Social Connections: Patient Declined (01/27/2024)   Social Connection and Isolation Panel    Frequency of Communication with Friends and Family: Patient declined    Frequency of Social Gatherings with Friends and Family: Patient declined    Attends Religious Services: Patient  declined    Active Member of Clubs or Organizations: Patient declined    Attends Banker Meetings: Patient declined    Marital Status: Patient declined  Intimate Partner Violence: Patient Declined (01/27/2024)   Humiliation, Afraid, Rape, and Kick questionnaire    Fear of Current or Ex-Partner: Patient declined    Emotionally Abused: Patient declined    Physically Abused: Patient declined    Sexually Abused: Patient declined    Family History  Problem Relation Age of Onset   Hypertension Sister    Diabetes Mellitus II Brother      Vitals:   02/03/24 0000 02/03/24 0304 02/03/24 0400 02/03/24 0426  BP: 120/62  (!) 93/56   Pulse: 78 77 73   Resp: 18 17 14    Temp: 98 F (36.7 C)  98 F (36.7 C)   TempSrc: Oral  Oral   SpO2: 98% 98% 97%   Weight:    124.5 kg  Height:        PHYSICAL EXAM General: Chronically ill appearing female, well nourished, in no acute distress. HEENT: Normocephalic and atraumatic. Neck: No JVD.  Lungs: Normal respiratory effort on 4L. Coarse breath sounds bilaterally.  Heart: HRRR. Normal S1 and S2 without gallops or murmurs.  Abdomen: Non-distended appearing.  Msk: Normal strength and tone for age. Extremities: Warm and well perfused. No clubbing, cyanosis. Bilateral LE with edema and erythema, wraps in place -overall improved.  Neuro: Alert and oriented X 3. Psych: Answers  questions appropriately.   Labs: Basic Metabolic Panel: Recent Labs    02/02/24 0353 02/03/24 0415  NA 136 136  K 4.1 4.8  CL 86* 87*  CO2 41* 38*  GLUCOSE 107* 123*  BUN 63* 71*  CREATININE 2.56* 2.94*  CALCIUM  10.3 10.1   Liver Function Tests: No results for input(s): AST, ALT, ALKPHOS, BILITOT, PROT, ALBUMIN in the last 72 hours.  No results for input(s): LIPASE, AMYLASE in the last 72 hours. CBC: Recent Labs    02/02/24 0353 02/03/24 0415  WBC 7.3 6.7  HGB 9.8* 10.2*  HCT 33.0* 33.7*  MCV 94.3 93.4  PLT 288 300   Cardiac Enzymes: No results for input(s): CKTOTAL, CKMB, CKMBINDEX, TROPONINIHS in the last 72 hours. BNP: No results for input(s): BNP in the last 72 hours. D-Dimer: No results for input(s): DDIMER in the last 72 hours. Hemoglobin A1C: No results for input(s): HGBA1C in the last 72 hours. Fasting Lipid Panel: No results for input(s): CHOL, HDL, LDLCALC, TRIG, CHOLHDL, LDLDIRECT in the last 72 hours. Thyroid Function Tests: No results for input(s): TSH, T4TOTAL, T3FREE, THYROIDAB in the last 72 hours.  Invalid input(s): FREET3  Anemia Panel: No results for input(s): VITAMINB12, FOLATE, FERRITIN, TIBC, IRON, RETICCTPCT in the last 72 hours.   Radiology: US  RENAL Result Date: 01/29/2024 CLINICAL DATA:  Acute renal failure. EXAM: RENAL / URINARY TRACT ULTRASOUND COMPLETE COMPARISON:  None Available. FINDINGS: Right Kidney: Renal measurements: 10.8 x 4.5 x 5.2 cm = volume: 131 mL. Echogenicity within normal limits. No mass or hydronephrosis visualized. Left Kidney: Renal measurements: 11.2 x 5.2 x 5.0 cm = volume: 150 mL. Echogenicity within normal limits. No mass or hydronephrosis visualized. Limited definition of both kidneys by ultrasound due to body habitus. Bladder: Appears normal for degree of bladder distention. Other: None. IMPRESSION: Unremarkable renal ultrasound. Electronically  Signed   By: Marcey Moan M.D.   On: 01/29/2024 16:06   DG Chest Port 1 View Result Date: 01/28/2024 CLINICAL DATA:  Shortness of  breath EXAM: PORTABLE CHEST 1 VIEW COMPARISON:  Chest x-ray 01/26/2024 FINDINGS: Heart is enlarged. There central pulmonary vascular congestion. There some minimal patchy opacities in the right mid lung. The costophrenic angles are clear. No pneumothorax or acute fracture. There are degenerative changes of the left shoulder. IMPRESSION: 1. Cardiomegaly with central pulmonary vascular congestion. 2. Minimal patchy opacities in the right mid lung may represent atelectasis or infection. Electronically Signed   By: Greig Pique M.D.   On: 01/28/2024 17:49   ECHOCARDIOGRAM COMPLETE Result Date: 01/28/2024    ECHOCARDIOGRAM REPORT   Patient Name:   Kelly Hogan Date of Exam: 01/28/2024 Medical Rec #:  [id number]         Height:       64.0 in Accession #:    [id number]        Weight:       308.6 lb Date of Birth:  Jul 28, 1940         BSA:          2.353 m Patient Age:    83 years          BP:           117/59 mmHg Patient Gender: F                 HR:           89 bpm. Exam Location:  [facility] Procedure: 2D Echo, Cardiac Doppler and Color Doppler (Both Spectral and Color            Flow Doppler were utilized during procedure). Indications:     Abnormal ECG R94.31  History:         Patient has no prior history of Echocardiogram examinations.                  COPD; Risk Factors:Hypertension and Diabetes.  Sonographer:     Christopher Furnace Referring Phys:  [id number] [facility] Diagnosing Phys: Marsa Dooms MD  Sonographer Comments: Technically challenging study due to limited acoustic windows, patient is obese and no apical window. IMPRESSIONS  1. Left ventricular ejection fraction, by estimation, is 60 to 65%. The left ventricle has normal function. The left ventricle has no regional wall motion abnormalities. Left ventricular diastolic parameters are indeterminate.  2. Right  ventricular systolic function is normal. The right ventricular size is normal.  3. The mitral valve is normal in structure. Mild mitral valve regurgitation. No evidence of mitral stenosis.  4. The aortic valve is normal in structure. Aortic valve regurgitation is not visualized. No aortic stenosis is present.  5. The inferior vena cava is normal in size with greater than 50% respiratory variability, suggesting right atrial pressure of 3 mmHg. FINDINGS  Left Ventricle: Left ventricular ejection fraction, by estimation, is 60 to 65%. The left ventricle has normal function. The left ventricle has no regional wall motion abnormalities. Strain was performed and the global longitudinal strain is indeterminate. The left ventricular internal cavity size was normal in size. There is no left ventricular hypertrophy. Left ventricular diastolic parameters are indeterminate. Right Ventricle: The right ventricular size is normal. No increase in right ventricular wall thickness. Right ventricular systolic function is normal. Left Atrium: Left atrial size was normal in size. Right Atrium: Right atrial size was normal in size. Pericardium: There is no evidence of pericardial effusion. Mitral Valve: The mitral valve is normal in structure. Mild mitral valve regurgitation. No evidence of mitral valve stenosis. Tricuspid Valve: The tricuspid valve is  normal in structure. Tricuspid valve regurgitation is mild . No evidence of tricuspid stenosis. Aortic Valve: The aortic valve is normal in structure. Aortic valve regurgitation is not visualized. No aortic stenosis is present. Pulmonic Valve: The pulmonic valve was normal in structure. Pulmonic valve regurgitation is not visualized. No evidence of pulmonic stenosis. Aorta: The aortic root is normal in size and structure. Venous: The inferior vena cava is normal in size with greater than 50% respiratory variability, suggesting right atrial pressure of 3 mmHg. IAS/Shunts: No atrial level  shunt detected by color flow Doppler. Additional Comments: 3D was performed not requiring image post processing on an independent workstation and was indeterminate.  LEFT VENTRICLE PLAX 2D LVIDd:         4.80 cm LVIDs:         2.80 cm LV PW:         1.00 cm LV IVS:        1.10 cm LVOT diam:     2.00 cm LVOT Area:     3.14 cm  LEFT ATRIUM         Index LA diam:    2.90 cm 1.23 cm/m   AORTA Ao Root diam: 2.70 cm  SHUNTS Systemic Diam: 2.00 cm Marsa Dooms MD Electronically signed by Marsa Dooms MD Signature Date/Time: 01/28/2024/1:33:28 PM    Final    US  Venous Img Lower Unilateral Left (DVT) Result Date: 01/27/2024 EXAM: ULTRASOUND DUPLEX OF THE LEFT LOWER EXTREMITY VEINS TECHNIQUE: Duplex ultrasound using B-mode/gray scaled imaging and Doppler spectral analysis and color flow was obtained of the deep venous structures of the left lower extremity. COMPARISON: None available. CLINICAL HISTORY: Swelling. FINDINGS: There is no evidence of deep venous thrombosis within the left lower extremity. The common femoral vein, femoral vein, and popliteal vein of the left lower extremity demonstrate normal compressibility with normal color flow and spectral analysis. There is limited visualization of the left posterior tibial veins and the left peroneal veins are not identified. IMPRESSION: 1. No evidence of DVT in the left lower extremity. 2. Limited visualization of the left posterior tibial veins and the left peroneal veins are not identified. Electronically signed by: Evalene Coho MD 01/27/2024 05:43 AM EST RP Workstation: [id number]   DG Chest Port 1 View Result Date: 01/26/2024 CLINICAL DATA:  Questionable sepsis EXAM: PORTABLE CHEST 1 VIEW COMPARISON:  Chest radiograph dated 07/07/2019. FINDINGS: No focal consolidation, pleural effusion or pneumothorax. Top-normal cardiac size. No acute osseous pathology. IMPRESSION: No active disease. Electronically Signed   By: Vanetta Chou M.D.   On:  01/26/2024 13:58    ECHO as above  TELEMETRY (personally reviewed): Sinus rhythm rate 70s  EKG (personally reviewed): Sinus rhythm rate 81 bpm  Data reviewed by me 02/03/2024: last 24h vitals tele labs imaging I/O ED provider note, admission H&P, hospitalist progress note, ID note, nephrology note  Principal Problem:   Sepsis due to cellulitis [facility]) Active Problems:   Essential (primary) hypertension   Type 2 diabetes mellitus (HCC)   Chronic venous insufficiency   HLD (hyperlipidemia)   Morbid obesity with BMI of 50.0-59.9, adult (HCC)   OSA (obstructive sleep apnea)   Obesity hypoventilation syndrome (HCC)   (HFpEF) heart failure with preserved ejection fraction (HCC)   Bacteremia due to Pseudomonas   CO2 narcosis   Metabolic encephalopathy   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nostras verrucosa   Cellulitis of left lower extremity    ASSESSMENT AND PLAN:  Kelly Hogan is a 83 y.o. female  with a  past medical history of COPD, chronic respiratory failure on 4 L, hypertension, hyperlipidemia, type 2 diabetes, obesity, obesity hypoventilation syndrome, history of venous insufficiency who presented to the ED on 01/26/2024 for concern for leg infection.  Noted to be in atrial fibrillation RVR initially, placed on IV amiodarone  and ultimately converted to normal sinus rhythm.  Blood cultures were positive for Pseudomonas.  Cardiology was consulted for further evaluation.   # Acute on chronic hypoxic respiratory failure # Atrial fibrillation RVR # New onset atrial fibrillation # Cellulitis # Pseudomonas bacteremia # Hypertension Patient sent to the ED for evaluation of cellulitis, ultimately developed atrial fibrillation RVR which was treated with IV amiodarone  and she converted to normal sinus rhythm yesterday.  Found to have positive blood cultures growing Pseudomonas.  Last week developed worsening hypoxia and was placed on BiPAP, now on nasal cannula.  Echo this admission with EF 60-65%, no  WMA's, mild MR. - Hold PO lasix  today, resume tomorrow pending renal function. - Continue p.o. amiodarone  load with 400 mg twice daily for 7 days followed by 200 mg daily. - Continue Eliquis  2.5 mg twice daily (dose appropriate given age, renal function). - Continue pravastatin  20 mg daily. - Reviewed ID note from Friday & Monday, no mention of TEE and TTE from this admission did not show any significant valvular issues. - Further management of cellulitis, bacteremia as per primary team.  This patient's plan of care was discussed and created with Dr. Florencio and he is in agreement.  Signed: Danita Bloch, PA-C  02/03/2024, 8:08 AM [facility] Cardiology

## 2024-02-03 NOTE — Plan of Care (Signed)

## 2024-02-03 NOTE — Progress Notes (Signed)
 Progress Note   Patient: Kelly Hogan FMW:[id number] DOB: 04/27/1940 DOA: 01/26/2024     8 DOS: the patient was seen and examined on 02/03/2024   Brief hospital course:  From HPI : Kelly Hogan is a 83 y.o. 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   The rest of hospital course as noted below     11/11: admitted to hospitalist w/ hypotension, cellulitis. Metronidazole and cefepime in the ED. SVT with rates of 140s, pt requiring amio gtt.   11/12: (+)Pseudomonas bacteremia - linezolid and vancomycin. Remains on amio gtt for now, cardiology will see in AM. Was off amio briefly this evening d/t limited IV access and needed other Rx, HR was WNL but then up again and back on the drip. Renal fxn worse, resp acidosis and hypercarbia, pt placed on BiPAP. Fluids changed to bicarb infusion 1L. Hyperkalemia, mild, gave K shifting Rx w/ insulin/D50, also admin Lokelma.  11/13: renal fxn continuing to worsen - Nephrology consulted. Pt remains on BiPAP this morning. Was off briefly per her request but O2 down and SOB so placed back on the BiPAP. BP soft, gave another 1L fluids. Cardiology - transition to po amio, starting eliquis. ID recs change ceftazidime d/t myoclonus effects and confusion. Still worsening into the afternoon, CXR and BNP indicative of HFpEF w/ pulmonary edema despite fairly clear lungs (but exam difficult). Son is ok for trial of diuretics despite renal fxn.  11/14: good UOP and respiratory is improved onto [place] O2, renal fxn about same. Continuing on ceftazidime. Changed morphine to dilaudid w/ renal fxn. Renal US  no concerns. Cr 2.86. BCx repeated. Requiring BiPAP at night  11/15: somnolent but alerts to voice. Has not wanted to sit up  in bed/chair. Cr 2.79. UOP yesterday looks like under documented. Continue diuresis. Repeat BCx NG thus far.  11/16: Cr 2.32, up to chair today encouraging.  11/17: Cr 2.15, CO2 levels higher today on BMP, pt refusing BiPAP overnight, encouraged once again she needs to wear at night, needs to be up to chair as much as possible, incentive spirometer. Per ID since she has improved and wbc normalized can complete a total 7 days of IV antibiotic (ceftazidime)  11/18: much more alert today after being on BiPAP last night.          Consultants:  Cardiology  Infectious disease    Procedures/Surgeries: none   ASSESSMENT & PLAN:   Cellulitis of the left lower extremity. Present on admission and ruled out DVT (though US  exam somewhat limited) (+)Pseudomonas bacteremia Dc cefepime d/t myoclonus / neuro effects.  Completed ceftazidime.  Has completed 7 days course of antibiotics Infectious disease on board   HFpEF Exacerbation d/t SVT + fluid tx per sepsis protocol  Echo noted preserved EF but unable to assess diastolic fxn, likely is dysfunctional. No significant valve dysfunction.  Diuresing Lasix  IV 60 mg bid  Strict I&O Careful monitoring BMP / renal fxn --> improving  Cardiology following    SVT:  Amiodarone po Eliquis 2.5 mg bid  Cardiologist on board in case discussed Echo as above       Acte on Chronic hypoxic/hypercarbic respiratory failure secondary to COPD and OSA and HFpEF, suspect restrictive disease / obesity hypoventilation syndrome Resp  support was escalated to BIPAP and now weaned down to [place] but requiring BiPAP at night Pt has been resistant to sitting upright to allow better chest expansion but was able to sit in chair awhile today  Incentive spirometry encouraged    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   Midodrine discontinued hold antihypertensives. Cardiology following    Type 2 diabetes Monitor glc Relatively low glc, will hold on SSI for now   Hyperlipidemia:  statin   Chronic venous stasis Rec follow w/ lymphedema clinic    Skin nodularity bilateral lower extremities Question hyperkeratotic lesions? Keloid type lesions?  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  Healthy nutrition and physical activity advised as adjunct to other disease management and risk reduction treatments       DVT prophylaxis: lovenox    Code Status: DNR per MOST form which was completed by the patient    TOC needs: SNF  Medical barriers to dispo: IV abx and bacteremia. Hypercarbia requiring BiPAP. Expected medical readiness for discharge 1-2 days     Subjective She tells me her respiratory function is improving According to case management for patient qualified for BiPAP facility fax and ABG which has been requested They also ask him pulmonary function test which can be done as an outpatient     Family Communication: No family at bedside today Physical Exam Constitutional: Elderly female sitting up in a chair mental status improved    Rate and Rhythm: Normal rate and regular rhythm.  Pulmonary: Decreased air entry bibasilarly Musculoskeletal:     Comments: Bilateral LE wraps were not disturbed, no proximal pitting edema or erythema   Skin: Neurological:      Mental Status: She is alert and oriented to person, place, and time. Mental status is at baseline.  Psychiatric:        Mood and Affect: Mood normal.        Behavior: Behavior normal.   Vitals:   02/03/24 0400 02/03/24 0426 02/03/24 0800 02/03/24 1512  BP: (!) 93/56  (!) 121/58 112/68  Pulse: 73     Resp: 14     Temp: 98 F (36.7 C)  98.1 F (36.7 C) 98 F (36.7 C)  TempSrc: Oral  Oral Oral  SpO2: 97%     Weight:  124.5 kg    Height:        Data Reviewed:     Latest Ref Rng & Units 02/03/2024    4:15 AM 02/02/2024    3:53 AM 01/30/2024    4:11 AM  CBC  WBC 4.0 - 10.5 K/uL 6.7  7.3  11.3   Hemoglobin 12.0 - 15.0 g/dL 89.7  9.8  9.0   Hematocrit 36.0 - 46.0 % 33.7  33.0  28.7   Platelets 150 - 400 K/uL 300  288  256  Latest Ref Rng & Units 02/03/2024    4:15 AM 02/02/2024    3:53 AM 02/01/2024    3:06 AM  BMP  Glucose 70 - 99 mg/dL 876  892  94   BUN 8 - 23 mg/dL 71  63  62   Creatinine 0.44 - 1.00 mg/dL 7.05  7.43  7.84   Sodium 135 - 145 mmol/L 136  136  141   Potassium 3.5 - 5.1 mmol/L 4.8  4.1  4.2   Chloride 98 - 111 mmol/L 87  86  93   CO2 22 - 32 mmol/L 38  41  40   Calcium  8.9 - 10.3 mg/dL 89.8  89.6  89.6      Author: Drue ONEIDA Potter, MD 02/03/2024 5:50 PM  For on call review [email or web address].

## 2024-02-03 NOTE — Progress Notes (Signed)
 Occupational Therapy Treatment Patient Details Name: Kelly Hogan MRN: [id number] DOB: 08-24-1940 Today's Date: 02/03/2024   History of present illness Kelly Hogan is a 83 y.o. 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 She was placed on BiPAP and uses 4L at baseline.   OT comments  Patient seen for OT/PT treatment on this date. Upon arrival to room patient resting in bed, reports several episodes of bowel incontinence today but is agreeable to treatment. Patient demonstrated improvements in all areas of mobility today, she transitioned to EOB with mod A x 2; performed sit<>stand from EOB with r/w with min A x 2 with cues for hand palcement/body mechanics; she then performed SPT from EOB to recliner with min A x 2; cues for body mechanics, walker management and posture with good return demo. OT attempted to have patient perform grooming task (combing hair) while in recliner but patient reports she is unable to do, because of L shoulder pain; OT assisted. OT instructed patient to sit up EOB or in recliner for meals to improve overall activity tolerance/sitting EOB, patient agreeable and nursing notified.  Patient ended treatment in recliner with all needs within reach. Patient making good progress toward goals, will continue to follow POC. Discharge recommendation remains appropriate.        If plan is discharge home, recommend the following:  Two people to help with walking and/or transfers;Two people to help with bathing/dressing/bathroom;Direct supervision/assist for medications management;Supervision due to cognitive status;Assist for transportation;Assistance with cooking/housework;Help with stairs or ramp for entrance;Assistance with  feeding;Direct supervision/assist for financial management   Equipment Recommendations  Other (comment) (defer to next venue of care)    Recommendations for Other Services      Precautions / Restrictions Precautions Precautions: Fall Recall of Precautions/Restrictions: Intact Restrictions Weight Bearing Restrictions Per Provider Order: No       Mobility Bed Mobility Overal bed mobility: Needs Assistance Bed Mobility: Supine to Sit     Supine to sit: Mod assist, +2 for physical assistance     General bed mobility comments: patient able to use bed rails to initiate rolling to the L, was able to maneuver BLE to EOB with min A; mod A to lift trunk off EOB.    Transfers Overall transfer level: Needs assistance Equipment used: Rolling walker (2 wheels) Transfers: Bed to chair/wheelchair/BSC Sit to Stand: Min assist, +2 physical assistance, +2 safety/equipment, Mod assist, From elevated surface     Step pivot transfers: Min assist, +2 physical assistance, +2 safety/equipment, Mod assist     General transfer comment: patient performed SPT from EOB to recliner with min/mod A x 2     Balance                                           ADL either performed or assessed with clinical judgement   ADL       Grooming: Brushing hair;Sitting;Maximal assistance               Lower Body Dressing: Maximal assistance;Bed level Lower Body Dressing Details (indicate cue type and reason): A to don socks  Extremity/Trunk Assessment Upper Extremity Assessment Upper Extremity Assessment: Generalized weakness            Vision       Perception     Praxis     Communication Communication Communication: Impaired Factors Affecting Communication: Reduced clarity of speech   Cognition Arousal: Alert Behavior During Therapy: WFL for tasks assessed/performed Cognition: No apparent impairments                                Following commands: Intact        Cueing   Cueing Techniques: Verbal cues, Visual cues  Exercises      Shoulder Instructions       General Comments 4L of O2 at start, increased to 6L during mobility, returned to 4L by end of tx    Pertinent Vitals/ Pain       Pain Assessment Pain Assessment: No/denies pain  Home Living                                          Prior Functioning/Environment              Frequency  Min 2X/week        Progress Toward Goals  OT Goals(current goals can now be found in the care plan section)  Progress towards OT goals: Progressing toward goals  Acute Rehab OT Goals Patient Stated Goal: to get out of here OT Goal Formulation: With patient Time For Goal Achievement: 02/12/24 Potential to Achieve Goals: Fair ADL Goals Pt Will Perform Grooming: sitting;with set-up;with supervision Pt Will Transfer to Toilet: stand pivot transfer;bedside commode;with +2 assist;with mod assist Pt Will Perform Toileting - Clothing Manipulation and hygiene: sit to/from stand;with mod assist  Plan      Co-evaluation    PT/OT/SLP Co-Evaluation/Treatment: Yes Reason for Co-Treatment: Complexity of the patient's impairments (multi-system involvement) PT goals addressed during session: Mobility/safety with mobility OT goals addressed during session: ADL's and self-care      AM-PAC OT 6 Clicks Daily Activity     Outcome Measure   Help from another person eating meals?: A Lot Help from another person taking care of personal grooming?: A Lot Help from another person toileting, which includes using toliet, bedpan, or urinal?: Total Help from another person bathing (including washing, rinsing, drying)?: Total Help from another person to put on and taking off regular upper body clothing?: Total Help from another person to put on and taking off regular lower body clothing?: Total 6 Click Score: 8    End of Session Equipment  Utilized During Treatment: Gait belt;Rolling walker (2 wheels);Oxygen  OT Visit Diagnosis: Muscle weakness (generalized) (M62.81);Pain;Other abnormalities of gait and mobility (R26.89) Pain - Right/Left: Left Pain - part of body: Leg   Activity Tolerance Patient tolerated treatment well   Patient Left in chair;with call bell/phone within reach   Nurse Communication Mobility status        Time: 8885-8855 OT Time Calculation (min): 30 min  Charges: OT General Charges $OT Visit: 1 Visit OT Treatments $Self Care/Home Management : 23-37 mins  Rogers Clause, OT/L MSOT, 02/03/2024

## 2024-02-03 NOTE — TOC Progression Note (Signed)
 Transition of Care [facility]) - Progression Note    Patient Details  Name: Kelly Hogan MRN: [id number] Date of Birth: 1941-01-27  [facility] of [facility]) CM/SW Contact  Alfonso Rummer, LCSW Phone Number: 02/03/2024, 3:06 PM  Clinical Narrative:    LCSW A [name] placed order for bipap with [facility]. Pt not medically ready to transition to snf.    Expected Discharge Plan: Skilled Nursing Facility Barriers to Discharge: Continued Medical Work up               Expected Discharge Plan and Services       Living arrangements for the past 2 months: Single Family Home                                       Social Drivers of Health (SDOH) Interventions SDOH Screenings   Food Insecurity: Patient Declined (01/27/2024)  Housing: Patient Declined (01/27/2024)  Transportation Needs: Patient Declined (01/27/2024)  Utilities: Patient Declined (01/27/2024)  Financial Resource Strain: Low Risk (08/12/2023)   Received from [facility]  Social Connections: Patient Declined (01/27/2024)  Stress: No Stress Concern Present (11/17/2023)   Received from [facility]  Tobacco Use: Medium Risk (12/21/2023)   Received from [facility] Literacy: Low Risk (07/06/2023)   Received from [facility]    Readmission Risk Interventions     No data to display

## 2024-02-03 NOTE — Progress Notes (Signed)
 Physical Therapy Treatment Patient Details Name: Kelly Hogan MRN: [id number] DOB: 1940-06-13 Today's Date: 02/03/2024   History of Present Illness Kelly Hogan is a 83 y.o. 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 She was placed on BiPAP and uses 4L at baseline.    PT Comments  PT/OT cotx on this date. Pt received in bed and was alert and agreeable to therapy treatment. Pt was much more conversational today and arousal/mental status has improved significantly. She had reports of bowel incontinence x3 this morning and required total assist for pericare/hygiene once she was standing at bedside. Pt able to perform supine to sit with mod A for physical assist with trunk support and scooting closer to EOB. Pt also required mod vc for hand placement on bed rails. Upon achieving EOB position, pt edu on PLB which she consistently practiced throughout session. Pt able to perform STS to RW with mod A +2 physical assist for steadiness and vc to bring hips forward and achieve neutral trunk. Pt stood for 3 min with min/mod 1+ assist while pericare was performed before taking a seated rest at EOB. Pt performed another STS with mod A +2 and was able to take ~2 steps to the L with RW and min/mod A for a step-pivot transfer into recliner. Mod-Max 2+ physical assist to scoot up in recliner d/t decreased WB tolerance through L shoulder. Pt was titrated up to 6L with standing activities and returned to 4L at end of session. Pt left in recliner and set up for hoyer transfer back to bed after spending some time in recliner. Pt will continue to benefit from skilled therapy services to address her reduced activity tolerance and functional mobility.   If plan is  discharge home, recommend the following: Two people to help with walking and/or transfers;Two people to help with bathing/dressing/bathroom;Assist for transportation   Can travel by private vehicle     No  Equipment Recommendations  Other (comment) (TBD at next venue)    Recommendations for Other Services       Precautions / Restrictions Precautions Precautions: Fall Recall of Precautions/Restrictions: Intact Restrictions Weight Bearing Restrictions Per Provider Order: No     Mobility  Bed Mobility Overal bed mobility: Needs Assistance Bed Mobility: Supine to Sit     Supine to sit: Mod assist, +2 for physical assistance     General bed mobility comments: patient able to use bed rails to initiate rolling to the L, was able to maneuver BLE to EOB with min A; mod A to lift trunk off EOB.    Transfers Overall transfer level: Needs assistance Equipment used: Rolling walker (2 wheels) Transfers: Bed to chair/wheelchair/BSC Sit to Stand: Min assist, +2 physical assistance, +2 safety/equipment, Mod assist, From elevated surface   Step pivot transfers: Min assist, +2 physical assistance, +2 safety/equipment, Mod assist       General transfer comment: patient performed SPT from EOB to recliner with min/mod A x 2    Ambulation/Gait Ambulation/Gait assistance: Mod assist, Min assist, +2 physical assistance Gait Distance (Feet): 2 Feet Assistive device: Rolling walker (2 wheels) Gait Pattern/deviations: Step-to pattern       General Gait Details: performed 49ft of ambulation with RW and +2 min/mod assist to step pivot  to recliner; no buckling or LOB noted   Stairs             Wheelchair Mobility     Tilt Bed    Modified Rankin (Stroke Patients Only)       Balance Overall balance assessment: Needs assistance Sitting-balance support: No upper extremity supported, Feet supported Sitting balance-Leahy Scale: Fair Sitting balance - Comments: supervision/CGA  for EOB balance without BUE support   Standing balance support: Reliant on assistive device for balance, Bilateral upper extremity supported Standing balance-Leahy Scale: Poor Standing balance comment: Heavy reliance on RW for balance. Multimodal cuing to maintain upright posture.                            Communication Communication Communication: Impaired Factors Affecting Communication: Reduced clarity of speech  Cognition Arousal: Alert Behavior During Therapy: WFL for tasks assessed/performed   PT - Cognitive impairments: No apparent impairments                       PT - Cognition Comments: Pleasant and agreeable to PT session. Pt much more alert this session and more conversational. Following commands: Intact      Cueing Cueing Techniques: Verbal cues  Exercises Other Exercises Other Exercises: edu on PLB throuhgout session    General Comments General comments (skin integrity, edema, etc.): 4L of O2 at start, increased to 6L during mobility, returned to 4L by end of tx      Pertinent Vitals/Pain Pain Assessment Pain Assessment: No/denies pain    Home Living Family/patient expects to be discharged to:: Private residence Living Arrangements: Children Available Help at Discharge: Family Type of Home: House Home Access: Ramped entrance       Home Layout: One level Home Equipment: Wheelchair - manual;Cane - single point;Rollator (4 wheels) Additional Comments: pt used w/c for community mobiltiy    Prior Function            PT Goals (current goals can now be found in the care plan section) Acute Rehab PT Goals PT Goal Formulation: With patient Time For Goal Achievement: 02/12/24 Potential to Achieve Goals: Fair Progress towards PT goals: Progressing toward goals    Frequency    Min 2X/week      PT Plan      Co-evaluation PT/OT/SLP Co-Evaluation/Treatment: Yes Reason for Co-Treatment: Complexity of the patient's impairments  (multi-system involvement) PT goals addressed during session: Mobility/safety with mobility OT goals addressed during session: ADL's and self-care      AM-PAC PT 6 Clicks Mobility   Outcome Measure  Help needed turning from your back to your side while in a flat bed without using bedrails?: Total Help needed moving from lying on your back to sitting on the side of a flat bed without using bedrails?: A Lot Help needed moving to and from a bed to a chair (including a wheelchair)?: A Lot Help needed standing up from a chair using your arms (e.g., wheelchair or bedside chair)?: A Lot Help needed to walk in hospital room?: Total Help needed climbing 3-5 steps with a railing? : Total 6 Click Score: 9    End of Session Equipment Utilized During Treatment: Oxygen;Gait belt Activity Tolerance: Patient tolerated treatment well Patient left: in chair;with call bell/phone within reach Nurse Communication: Mobility status;Need for lift equipment PT Visit Diagnosis: Muscle weakness (generalized) (M62.81);Other abnormalities of gait and mobility (R26.89)     Time: 8884-8855 PT Time  Calculation (min) (ACUTE ONLY): 29 min  Charges:                            Allena Bulls, SPT    Allena Bulls 02/03/2024, 2:02 PM

## 2024-02-04 DIAGNOSIS — A419 Sepsis, unspecified organism: Secondary | ICD-10-CM | POA: Diagnosis not present

## 2024-02-04 DIAGNOSIS — L0390 Cellulitis, unspecified: Secondary | ICD-10-CM | POA: Diagnosis not present

## 2024-02-04 LAB — BASIC METABOLIC PANEL WITH GFR
Anion gap: 12 (ref 5–15)
BUN: 84 mg/dL — ABNORMAL HIGH (ref 8–23)
CO2: 37 mmol/L — ABNORMAL HIGH (ref 22–32)
Calcium: 9.5 mg/dL (ref 8.9–10.3)
Chloride: 86 mmol/L — ABNORMAL LOW (ref 98–111)
Creatinine, Ser: 3.61 mg/dL — ABNORMAL HIGH (ref 0.44–1.00)
GFR, Estimated: 12 mL/min — ABNORMAL LOW (ref >60)
Glucose, Bld: 110 mg/dL — ABNORMAL HIGH (ref 70–99)
Potassium: 4.8 mmol/L (ref 3.5–5.1)
Sodium: 135 mmol/L (ref 135–145)

## 2024-02-04 LAB — CBC WITH DIFFERENTIAL/PLATELET
Abs Immature Granulocytes: 0.12 K/uL — ABNORMAL HIGH (ref 0.00–0.07)
Basophils Absolute: 0.1 K/uL (ref 0.0–0.1)
Basophils Relative: 1 %
Eosinophils Absolute: 0.2 K/uL (ref 0.0–0.5)
Eosinophils Relative: 2 %
HCT: 31.5 % — ABNORMAL LOW (ref 36.0–46.0)
Hemoglobin: 9.6 g/dL — ABNORMAL LOW (ref 12.0–15.0)
Immature Granulocytes: 2 %
Lymphocytes Relative: 15 %
Lymphs Abs: 1.1 K/uL (ref 0.7–4.0)
MCH: 27.8 pg (ref 26.0–34.0)
MCHC: 30.5 g/dL (ref 30.0–36.0)
MCV: 91.3 fL (ref 80.0–100.0)
Monocytes Absolute: 0.8 K/uL (ref 0.1–1.0)
Monocytes Relative: 11 %
Neutro Abs: 5 K/uL (ref 1.7–7.7)
Neutrophils Relative %: 69 %
Platelets: 290 K/uL (ref 150–400)
RBC: 3.45 MIL/uL — ABNORMAL LOW (ref 3.87–5.11)
RDW: 13.2 % (ref 11.5–15.5)
WBC: 7.2 K/uL (ref 4.0–10.5)
nRBC: 0 % (ref 0.0–0.2)

## 2024-02-04 LAB — GLUCOSE, CAPILLARY
Glucose-Capillary: 120 mg/dL — ABNORMAL HIGH (ref 70–99)
Glucose-Capillary: 154 mg/dL — ABNORMAL HIGH (ref 70–99)
Glucose-Capillary: 165 mg/dL — ABNORMAL HIGH (ref 70–99)
Glucose-Capillary: 204 mg/dL — ABNORMAL HIGH (ref 70–99)

## 2024-02-04 NOTE — TOC Progression Note (Signed)
 Transition of Care [facility]) - Progression Note    Patient Details  Name: Kelly Hogan MRN: [id number] Date of Birth: 02-13-1941  [facility] of [facility]) CM/SW Contact  Lauraine JAYSON Carpen, LCSW Phone Number: 02/04/2024, 1:30 PM  Clinical Narrative:  Per MD, earliest discharge will likely be Monday. [facility] admissions coordinator is aware. She confirmed they are aware of need for bipap at discharge.   Expected Discharge Plan: Skilled Nursing Facility Barriers to Discharge: Continued Medical Work up               Expected Discharge Plan and Services       Living arrangements for the past 2 months: Single Family Home                                       Social Drivers of Health (SDOH) Interventions SDOH Screenings   Food Insecurity: Patient Declined (01/27/2024)  Housing: Patient Declined (01/27/2024)  Transportation Needs: Patient Declined (01/27/2024)  Utilities: Patient Declined (01/27/2024)  Financial Resource Strain: Low Risk (08/12/2023)   Received from [facility]  Social Connections: Patient Declined (01/27/2024)  Stress: No Stress Concern Present (11/17/2023)   Received from [facility]  Tobacco Use: Medium Risk (12/21/2023)   Received from [facility] Literacy: Low Risk (07/06/2023)   Received from [facility]    Readmission Risk Interventions     No data to display

## 2024-02-04 NOTE — Progress Notes (Signed)
 Occupational Therapy Treatment Patient Details Name: Kelly Hogan MRN: [id number] DOB: 1940-05-23 Today's Date: 02/04/2024   History of present illness Kelly Hogan is a 83 y.o. 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 She was placed on BiPAP and uses 4L at baseline.   OT comments  Patient seen for OT treatment on this date. Upon arrival to room patient resting in bed with family present, agreeable to treatment. Patient transitioned to EOB with HOB raised with min A to lift trunk, no A needed for BLE; patient tolerated sitting EOB without support; patient performed sit<>stand from EOB with r/w with mod A for lifting; tolerated standing x 3 minutes while NT performed pericare and linen changed. Patient performed SPT from EOB to recliner with min-mod A x 1, cues for walker management and hand placement.  Patient ended treatment in recliner with bed/chair alarm on and all needs within reach. Patient making good progress toward goals, will continue to follow POC. Discharge recommendation remains appropriate. OT notified RN that purewick was out and didn't appear to be catching urine today however cannister empty; unsure if patient is retaining.       If plan is discharge home, recommend the following:  Two people to help with walking and/or transfers;Two people to help with bathing/dressing/bathroom;Direct supervision/assist for medications management;Supervision due to cognitive status;Assist for transportation;Assistance with cooking/housework;Help with stairs or ramp for entrance;Assistance with feeding;Direct supervision/assist for financial management   Equipment Recommendations  Other (comment) (defer to next venue of care)     Recommendations for Other Services      Precautions / Restrictions Precautions Precautions: Fall Recall of Precautions/Restrictions: Intact Restrictions Weight Bearing Restrictions Per Provider Order: No       Mobility Bed Mobility Overal bed mobility: Needs Assistance Bed Mobility: Supine to Sit Rolling: Min assist, Used rails              Transfers Overall transfer level: Needs assistance Equipment used: Rolling walker (2 wheels) Transfers: Bed to chair/wheelchair/BSC       Step pivot transfers: Min assist, +2 physical assistance, +2 safety/equipment, Mod assist     General transfer comment: patient performed SPT from EOB to recliner with min/mod A x 2     Balance                                           ADL either performed or assessed with clinical judgement   ADL                                              Extremity/Trunk Assessment              Vision       Perception     Praxis     Communication Communication Communication: Impaired   Cognition Arousal: Alert Behavior During Therapy: WFL for tasks assessed/performed Cognition: No apparent impairments  Following commands: Intact        Cueing   Cueing Techniques: Verbal cues  Exercises      Shoulder Instructions       General Comments      Pertinent Vitals/ Pain       Pain Assessment Pain Assessment: No/denies pain  Home Living                                          Prior Functioning/Environment              Frequency  Min 2X/week        Progress Toward Goals  OT Goals(current goals can now be found in the care plan section)  Progress towards OT goals: Progressing toward goals  Acute Rehab OT Goals Patient Stated Goal: to go home OT Goal Formulation: With patient Time For Goal Achievement: 02/12/24 Potential to Achieve Goals: Fair ADL Goals Pt Will  Perform Grooming: sitting;with set-up;with supervision Pt Will Transfer to Toilet: stand pivot transfer;bedside commode;with +2 assist;with mod assist Pt Will Perform Toileting - Clothing Manipulation and hygiene: sit to/from stand;with mod assist  Plan      Co-evaluation                 AM-PAC OT 6 Clicks Daily Activity     Outcome Measure   Help from another person eating meals?: A Lot Help from another person taking care of personal grooming?: A Lot Help from another person toileting, which includes using toliet, bedpan, or urinal?: Total Help from another person bathing (including washing, rinsing, drying)?: Total Help from another person to put on and taking off regular upper body clothing?: Total Help from another person to put on and taking off regular lower body clothing?: Total 6 Click Score: 8    End of Session Equipment Utilized During Treatment: Gait belt;Rolling walker (2 wheels);Oxygen  OT Visit Diagnosis: Muscle weakness (generalized) (M62.81);Pain;Other abnormalities of gait and mobility (R26.89) Pain - Right/Left: Left Pain - part of body: Leg   Activity Tolerance Patient tolerated treatment well   Patient Left in chair;with call bell/phone within reach   Nurse Communication Mobility status        Time: 8486-8459 OT Time Calculation (min): 27 min  Charges: OT General Charges $OT Visit: 1 Visit OT Treatments $Self Care/Home Management : 23-37 mins  Rogers Clause, OT/L MSOT, 02/04/2024

## 2024-02-04 NOTE — Plan of Care (Signed)
  Problem: Education: Goal: Knowledge of General Education information will improve Description: Including pain rating scale, medication(s)/side effects and non-pharmacologic comfort measures Outcome: Progressing   Problem: Activity: Goal: Risk for activity intolerance will decrease Outcome: Progressing   Problem: Nutrition: Goal: Adequate nutrition will be maintained Outcome: Progressing   Problem: Elimination: Goal: Will not experience complications related to bowel motility Outcome: Progressing Goal: Will not experience complications related to urinary retention Outcome: Progressing   Problem: Pain Managment: Goal: General experience of comfort will improve and/or be controlled Outcome: Progressing   Problem: Safety: Goal: Ability to remain free from injury will improve Outcome: Progressing   Problem: Skin Integrity: Goal: Skin integrity will improve Outcome: Progressing

## 2024-02-04 NOTE — Progress Notes (Signed)
 Progress Note   Patient: Kelly Hogan FMW:[id number] DOB: [birth year] DOA: 01/26/2024     9 DOS: the patient was seen and examined on 02/04/2024     Brief hospital course:  From HPI : Kelly Hogan is a 83 y.o. 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   The rest of hospital course as noted below     11/11: admitted to hospitalist w/ hypotension, cellulitis. Metronidazole  and cefepime  in the ED. SVT with rates of 140s, pt requiring amio gtt.   11/12: (+)Pseudomonas bacteremia - linezolid  and vancomycin . Remains on amio gtt for now, cardiology will see in AM. Was off amio briefly this evening d/t limited IV access and needed other Rx, HR was WNL but then up again and back on the drip. Renal fxn worse, resp acidosis and hypercarbia, pt placed on BiPAP. Fluids changed to bicarb infusion 1L. Hyperkalemia, mild, gave K shifting Rx w/ insulin /D50, also admin Lokelma .  11/13: renal fxn continuing to worsen - Nephrology consulted. Pt remains on BiPAP this morning. Was off briefly per her request but O2 down and SOB so placed back on the BiPAP. BP soft, gave another 1L fluids. Cardiology - transition to po amio, starting eliquis . ID recs change ceftazidime  d/t myoclonus effects and confusion. Still worsening into the afternoon, CXR and BNP indicative of HFpEF w/ pulmonary edema despite fairly clear lungs (but exam difficult). Son is ok for trial of diuretics despite renal fxn.  11/14: good UOP and respiratory is improved onto [place] O2, renal fxn about same. Continuing on ceftazidime . Changed morphine  to dilaudid  w/ renal fxn. Renal US  no concerns. Cr 2.86. BCx repeated. Requiring BiPAP at night  11/15: somnolent but alerts to voice. Has not wanted to  sit up in bed/chair. Cr 2.79. UOP yesterday looks like under documented. Continue diuresis. Repeat BCx NG thus far.  11/16: Cr 2.32, up to chair today encouraging.  11/17: Cr 2.15, CO2 levels higher today on BMP, pt refusing BiPAP overnight, encouraged once again she needs to wear at night, needs to be up to chair as much as possible, incentive spirometer. Per ID since she has improved and wbc normalized can complete a total 7 days of IV antibiotic (ceftazidime )  11/18: much more alert today after being on BiPAP last night.    Consultants:  Cardiology  Infectious disease    Procedures/Surgeries: none   ASSESSMENT & PLAN:   Cellulitis of the left lower extremity. Present on admission and ruled out DVT (though US  exam somewhat limited) (+)Pseudomonas bacteremia Dc cefepime  d/t myoclonus / neuro effects.  Completed ceftazidime .  Has completed 7 days course of antibiotics Infectious disease on board   HFpEF Exacerbation d/t SVT + fluid tx per sepsis protocol  Echo noted preserved EF but unable to assess diastolic fxn, likely is dysfunctional. No significant valve dysfunction.  Cardiology is holding off diuresis at this time given worsening renal function Strict I&O Careful monitoring BMP / renal fxn --> improving  Cardiology following    SVT:  Amiodarone  po Eliquis  2.5 mg bid  Cardiologist on board in case discussed Echo as above       Acte on Chronic hypoxic/hypercarbic respiratory failure secondary to COPD and OSA and HFpEF, suspect restrictive disease / obesity hypoventilation syndrome  Resp support was escalated to BIPAP and now weaned down to [place] but requiring BiPAP at night Pt has been resistant to sitting upright to allow better chest expansion but was able to sit in chair awhile today  Incentive spirometry encouraged    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   Midodrine  discontinued hold antihypertensives. Cardiology following    Type 2 diabetes Monitor glc Relatively low glc, will hold on SSI for now   Hyperlipidemia:  statin   Chronic venous stasis Rec follow w/ lymphedema clinic    Skin nodularity bilateral lower extremities Question hyperkeratotic lesions? Keloid type lesions?  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  Healthy nutrition and physical activity advised as adjunct to other disease management and risk reduction treatments       DVT prophylaxis: lovenox    Code Status: DNR  TOC needs: SNF  Medical barriers to dispo: IV abx and bacteremia.      Subjective Patient currently on 5 L of oxygen Denies nausea vomiting abdominal pain chest pain or cough     Family Communication: No family at bedside today  Physical Exam Constitutional: Elderly female sitting up in a chair mental status improved    Rate and Rhythm: Normal rate and regular rhythm.  Pulmonary: Decreased air entry bibasilarly Musculoskeletal:     Comments: Bilateral LE wraps were not disturbed, no proximal pitting edema or erythema   Skin: Neurological:     Mental Status: She is alert and oriented to person, place, and time. Mental status is at baseline.  Psychiatric:        Mood and Affect: Mood normal.         Behavior: Behavior normal.     Data reviewed: Vitals:   02/03/24 2250 02/04/24 0023 02/04/24 0557 02/04/24 1100  BP:  114/62 120/62 (!) 117/51  Pulse: 78 76    Resp: 18 16 (!) 21   Temp:  98.6 F (37 C) 98.3 F (36.8 C) 98 F (36.7 C)  TempSrc:    Oral  SpO2: 99% 100% 97%   Weight:   124.5 kg   Height:          Latest Ref Rng & Units 02/04/2024    3:19 AM 02/03/2024    4:15 AM 02/02/2024    3:53 AM  CBC  WBC 4.0 - 10.5 K/uL 7.2  6.7  7.3   Hemoglobin 12.0 - 15.0 g/dL 9.6  89.7  9.8   Hematocrit 36.0 - 46.0 % 31.5  33.7  33.0   Platelets 150 - 400 K/uL 290  300  288        Latest Ref Rng & Units 02/04/2024    3:19 AM 02/03/2024    4:15 AM 02/02/2024  3:53 AM  BMP  Glucose 70 - 99 mg/dL 889  876  892   BUN 8 - 23 mg/dL 84  71  63   Creatinine 0.44 - 1.00 mg/dL 6.38  7.05  7.43   Sodium 135 - 145 mmol/L 135  136  136   Potassium 3.5 - 5.1 mmol/L 4.8  4.8  4.1   Chloride 98 - 111 mmol/L 86  87  86   CO2 22 - 32 mmol/L 37  38  41   Calcium  8.9 - 10.3 mg/dL 9.5  89.8  89.6      Author: Drue ONEIDA Potter, MD 02/04/2024 4:10 PM  For on call review [email or web address].

## 2024-02-04 NOTE — Progress Notes (Signed)
 [facility] CLINIC CARDIOLOGY PROGRESS NOTE       Patient ID: Kelly Hogan MRN: [id number] DOB/AGE: 1940/05/24 83 y.o.  Admit date: 01/26/2024 Referring Physician Dr. Laneta Blunt Primary Physician [name], [name] LABOR, MD  Primary Cardiologist Damien Lee, NP Reason for Consultation AF, bacteremia  HPI: Kelly Hogan is a 83 y.o. female  with a past medical history of COPD, chronic respiratory failure on 4 L, hypertension, hyperlipidemia, type 2 diabetes, obesity, obesity hypoventilation syndrome, history of venous insufficiency who presented to the ED on 01/26/2024 for concern for leg infection.  Noted to be in atrial fibrillation RVR initially, placed on IV amiodarone  and ultimately converted to normal sinus rhythm.  Blood cultures were positive for Pseudomonas.  Cardiology was consulted for further evaluation.   Interval history: - Patient seen and examined this morning, resting comfortably in bed on supplemental O2. - Alert again today. Only complaint is leg pain. - Remains in sinus rhythm.  Continues to tolerate p.o. amiodarone .  Review of systems complete and found to be negative unless listed above    Past Medical History:  Diagnosis Date   Asthma    COPD (chronic obstructive pulmonary disease) (HCC)    Diabetes mellitus without complication (HCC)    Hypertension    Morbid obesity with BMI of 50.0-59.9, adult (HCC) 07/13/2019   Stomach ulcer     No past surgical history on file.  Medications Prior to Admission  Medication Sig Dispense Refill Last Dose/Taking   acetaminophen  (TYLENOL ) 500 MG tablet Take 500-1,000 mg by mouth every 6 (six) hours as needed for mild pain or fever.    01/26/2024   albuterol  (PROVENTIL ) (2.5 MG/3ML) 0.083% nebulizer solution Take 3 mLs by nebulization every 6 (six) hours as needed for wheezing.   01/26/2024   aspirin  81 MG EC tablet Take 81 mg by mouth daily.   01/25/2024   carvedilol  (COREG ) 25 MG tablet Take 12.5 mg by mouth in the  morning and at bedtime.   01/25/2024   Cholecalciferol  25 MCG (1000 UT) tablet Take 1,000 Units by mouth daily.   01/25/2024   ciclopirox (LOPROX) 0.77 % cream Apply 0.77 % topically 2 (two) times daily.   01/25/2024   diclofenac Sodium (VOLTAREN) 1 % GEL Apply 2 g topically 4 (four) times daily.   01/26/2024   docusate sodium  (COLACE) 100 MG capsule Take 100 mg by mouth 2 (two) times daily.  4 01/26/2024   empagliflozin (JARDIANCE) 10 MG TABS tablet Take 1 tablet by mouth daily.   01/25/2024   fluticasone  (FLONASE ) 50 MCG/ACT nasal spray Place 1 spray into both nostrils daily.    01/25/2024   gatifloxacin (ZYMAXID) 0.5 % SOLN Place 1 drop into the left eye 4 (four) times daily.   Past Month   ketorolac (ACULAR) 0.5 % ophthalmic solution Place 1 drop into the left eye 2 (two) times daily.   Unknown   Lidocaine 4 % PTCH Place onto the skin.   Past Month   olmesartan (BENICAR) 5 MG tablet Take 5 mg by mouth daily.   01/25/2024   omeprazole (PRILOSEC) 20 MG capsule Take 20 mg by mouth daily.   01/25/2024   Oyster Shell 500 MG TABS Take 500 mg by mouth in the morning, at noon, and at bedtime.   01/25/2024   pravastatin  (PRAVACHOL ) 20 MG tablet Take 20 mg by mouth daily.   01/25/2024   prednisoLONE acetate (PRED FORTE) 1 % ophthalmic suspension Place 1 drop into the left eye 4 (  four) times daily.   Past Month   PROAIR  HFA 108 (90 Base) MCG/ACT inhaler Inhale 2 puffs into the lungs every 4 (four) hours as needed for wheezing or shortness of breath.    01/26/2024   senna (SENOKOT) 8.6 MG tablet Take 1 tablet by mouth daily.   01/25/2024   Torsemide 40 MG TABS Take 40 mg by mouth 2 (two) times daily.   01/25/2024   TRELEGY ELLIPTA 100-62.5-25 MCG/INH AEPB Take 1 puff by mouth daily.   01/25/2024   amLODipine  (NORVASC ) 5 MG tablet Take 5 mg by mouth daily. (Patient not taking: Reported on 01/26/2024)   Not Taking   ferrous sulfate 324 MG TBEC Take 324 mg by mouth.   Unknown   furosemide  (LASIX ) 40 MG  tablet Take 40-80 mg by mouth as directed.  (Patient not taking: Reported on 01/26/2024)   Not Taking   hydrALAZINE  (APRESOLINE ) 25 MG tablet Take 25 mg by mouth 2 (two) times daily. (Patient not taking: Reported on 01/26/2024)   Not Taking   ipratropium-albuterol  (DUONEB) 0.5-2.5 (3) MG/3ML SOLN Take 3 mLs by nebulization every 6 (six) hours as needed (wheezing). (Patient not taking: Reported on 01/26/2024)   Not Taking   omeprazole (PRILOSEC) 20 MG capsule Take by mouth.      spironolactone  (ALDACTONE ) 25 MG tablet Take 25 mg by mouth daily. (Patient not taking: Reported on 01/26/2024)  3 Not Taking   telmisartan (MICARDIS) 80 MG tablet Take 80 mg by mouth daily. (Patient not taking: Reported on 01/26/2024)   Not Taking   Social History   Socioeconomic History   Marital status: Single    Spouse name: Not on file   Number of children: Not on file   Years of education: Not on file   Highest education level: Not on file  Occupational History   Not on file  Tobacco Use   Smoking status: Former    Types: Cigarettes   Smokeless tobacco: Never  Substance and Sexual Activity   Alcohol use: No   Drug use: No   Sexual activity: Not on file  Other Topics Concern   Not on file  Social History Narrative   Not on file   Social Drivers of Health   Financial Resource Strain: Low Risk (08/12/2023)   Received from [facility]   Overall Financial Resource Strain (CARDIA)    Difficulty of Paying Living Expenses: Not very hard  Food Insecurity: Patient Declined (01/27/2024)   Hunger Vital Sign    Worried About Running Out of Food in the Last Year: Patient declined    Ran Out of Food in the Last Year: Patient declined  Transportation Needs: Patient Declined (01/27/2024)   PRAPARE - Administrator, Civil Service (Medical): Patient declined    Lack of Transportation (Non-Medical): Patient declined  Physical Activity: Not on file  Stress: No Stress Concern Present (11/17/2023)    Received from [facility] - Occupational Stress Questionnaire    Do you feel stress - tense, restless, nervous, or anxious, or unable to sleep at night because your mind is troubled all the time - these days?: Only a little  Social Connections: Patient Declined (01/27/2024)   Social Connection and Isolation Panel    Frequency of Communication with Friends and Family: Patient declined    Frequency of Social Gatherings with Friends and Family: Patient declined    Attends Religious Services: Patient declined    Active Member  of Clubs or Organizations: Patient declined    Attends Banker Meetings: Patient declined    Marital Status: Patient declined  Intimate Partner Violence: Patient Declined (01/27/2024)   Humiliation, Afraid, Rape, and Kick questionnaire    Fear of Current or Ex-Partner: Patient declined    Emotionally Abused: Patient declined    Physically Abused: Patient declined    Sexually Abused: Patient declined    Family History  Problem Relation Age of Onset   Hypertension Sister    Diabetes Mellitus II Brother      Vitals:   02/03/24 1921 02/03/24 2250 02/04/24 0023 02/04/24 0557  BP: (!) 109/53  114/62 120/62  Pulse:  78 76   Resp: 14 18 16  (!) 21  Temp: 98.6 F (37 C)  98.6 F (37 C) 98.3 F (36.8 C)  TempSrc:      SpO2: 100% 99% 100% 97%  Weight:    124.5 kg  Height:        PHYSICAL EXAM General: Chronically ill appearing female, well nourished, in no acute distress. HEENT: Normocephalic and atraumatic. Neck: No JVD.  Lungs: Normal respiratory effort on 4L. Coarse breath sounds bilaterally.  Heart: HRRR. Normal S1 and S2 without gallops or murmurs.  Abdomen: Non-distended appearing.  Msk: Normal strength and tone for age. Extremities: Warm and well perfused. No clubbing, cyanosis. Bilateral LE with edema and erythema, wraps in place.  Neuro: Alert and oriented X 3. Psych: Answers questions  appropriately.   Labs: Basic Metabolic Panel: Recent Labs    02/03/24 0415 02/04/24 0319  NA 136 135  K 4.8 4.8  CL 87* 86*  CO2 38* 37*  GLUCOSE 123* 110*  BUN 71* 84*  CREATININE 2.94* 3.61*  CALCIUM  10.1 9.5   Liver Function Tests: No results for input(s): AST, ALT, ALKPHOS, BILITOT, PROT, ALBUMIN in the last 72 hours.  No results for input(s): LIPASE, AMYLASE in the last 72 hours. CBC: Recent Labs    02/03/24 0415 02/04/24 0319  WBC 6.7 7.2  NEUTROABS  --  5.0  HGB 10.2* 9.6*  HCT 33.7* 31.5*  MCV 93.4 91.3  PLT 300 290   Cardiac Enzymes: No results for input(s): CKTOTAL, CKMB, CKMBINDEX, TROPONINIHS in the last 72 hours. BNP: No results for input(s): BNP in the last 72 hours. D-Dimer: No results for input(s): DDIMER in the last 72 hours. Hemoglobin A1C: No results for input(s): HGBA1C in the last 72 hours. Fasting Lipid Panel: No results for input(s): CHOL, HDL, LDLCALC, TRIG, CHOLHDL, LDLDIRECT in the last 72 hours. Thyroid Function Tests: No results for input(s): TSH, T4TOTAL, T3FREE, THYROIDAB in the last 72 hours.  Invalid input(s): FREET3  Anemia Panel: No results for input(s): VITAMINB12, FOLATE, FERRITIN, TIBC, IRON, RETICCTPCT in the last 72 hours.   Radiology: US  RENAL Result Date: 01/29/2024 CLINICAL DATA:  Acute renal failure. EXAM: RENAL / URINARY TRACT ULTRASOUND COMPLETE COMPARISON:  None Available. FINDINGS: Right Kidney: Renal measurements: 10.8 x 4.5 x 5.2 cm = volume: 131 mL. Echogenicity within normal limits. No mass or hydronephrosis visualized. Left Kidney: Renal measurements: 11.2 x 5.2 x 5.0 cm = volume: 150 mL. Echogenicity within normal limits. No mass or hydronephrosis visualized. Limited definition of both kidneys by ultrasound due to body habitus. Bladder: Appears normal for degree of bladder distention. Other: None. IMPRESSION: Unremarkable renal ultrasound.  Electronically Signed   By: Marcey Moan M.D.   On: 01/29/2024 16:06   DG Chest Port 1 View Result Date: 01/28/2024 CLINICAL DATA:  Shortness of breath EXAM: PORTABLE CHEST 1 VIEW COMPARISON:  Chest x-ray 01/26/2024 FINDINGS: Heart is enlarged. There central pulmonary vascular congestion. There some minimal patchy opacities in the right mid lung. The costophrenic angles are clear. No pneumothorax or acute fracture. There are degenerative changes of the left shoulder. IMPRESSION: 1. Cardiomegaly with central pulmonary vascular congestion. 2. Minimal patchy opacities in the right mid lung may represent atelectasis or infection. Electronically Signed   By: Greig Pique M.D.   On: 01/28/2024 17:49   ECHOCARDIOGRAM COMPLETE Result Date: 01/28/2024    ECHOCARDIOGRAM REPORT   Patient Name:   Kelly Hogan Date of Exam: 01/28/2024 Medical Rec #:  [id number]         Height:       64.0 in Accession #:    [id number]        Weight:       308.6 lb Date of Birth:  09-20-1940         BSA:          2.353 m Patient Age:    83 years          BP:           117/59 mmHg Patient Gender: F                 HR:           89 bpm. Exam Location:  [facility] Procedure: 2D Echo, Cardiac Doppler and Color Doppler (Both Spectral and Color            Flow Doppler were utilized during procedure). Indications:     Abnormal ECG R94.31  History:         Patient has no prior history of Echocardiogram examinations.                  COPD; Risk Factors:Hypertension and Diabetes.  Sonographer:     Christopher Furnace Referring Phys:  [id number] [facility] Diagnosing Phys: Marsa Dooms MD  Sonographer Comments: Technically challenging study due to limited acoustic windows, patient is obese and no apical window. IMPRESSIONS  1. Left ventricular ejection fraction, by estimation, is 60 to 65%. The left ventricle has normal function. The left ventricle has no regional wall motion abnormalities. Left ventricular diastolic parameters are indeterminate.  2.  Right ventricular systolic function is normal. The right ventricular size is normal.  3. The mitral valve is normal in structure. Mild mitral valve regurgitation. No evidence of mitral stenosis.  4. The aortic valve is normal in structure. Aortic valve regurgitation is not visualized. No aortic stenosis is present.  5. The inferior vena cava is normal in size with greater than 50% respiratory variability, suggesting right atrial pressure of 3 mmHg. FINDINGS  Left Ventricle: Left ventricular ejection fraction, by estimation, is 60 to 65%. The left ventricle has normal function. The left ventricle has no regional wall motion abnormalities. Strain was performed and the global longitudinal strain is indeterminate. The left ventricular internal cavity size was normal in size. There is no left ventricular hypertrophy. Left ventricular diastolic parameters are indeterminate. Right Ventricle: The right ventricular size is normal. No increase in right ventricular wall thickness. Right ventricular systolic function is normal. Left Atrium: Left atrial size was normal in size. Right Atrium: Right atrial size was normal in size. Pericardium: There is no evidence of pericardial effusion. Mitral Valve: The mitral valve is normal in structure. Mild mitral valve regurgitation. No evidence of mitral valve stenosis. Tricuspid Valve: The tricuspid  valve is normal in structure. Tricuspid valve regurgitation is mild . No evidence of tricuspid stenosis. Aortic Valve: The aortic valve is normal in structure. Aortic valve regurgitation is not visualized. No aortic stenosis is present. Pulmonic Valve: The pulmonic valve was normal in structure. Pulmonic valve regurgitation is not visualized. No evidence of pulmonic stenosis. Aorta: The aortic root is normal in size and structure. Venous: The inferior vena cava is normal in size with greater than 50% respiratory variability, suggesting right atrial pressure of 3 mmHg. IAS/Shunts: No atrial  level shunt detected by color flow Doppler. Additional Comments: 3D was performed not requiring image post processing on an independent workstation and was indeterminate.  LEFT VENTRICLE PLAX 2D LVIDd:         4.80 cm LVIDs:         2.80 cm LV PW:         1.00 cm LV IVS:        1.10 cm LVOT diam:     2.00 cm LVOT Area:     3.14 cm  LEFT ATRIUM         Index LA diam:    2.90 cm 1.23 cm/m   AORTA Ao Root diam: 2.70 cm  SHUNTS Systemic Diam: 2.00 cm Marsa Dooms MD Electronically signed by Marsa Dooms MD Signature Date/Time: 01/28/2024/1:33:28 PM    Final    US  Venous Img Lower Unilateral Left (DVT) Result Date: 01/27/2024 EXAM: ULTRASOUND DUPLEX OF THE LEFT LOWER EXTREMITY VEINS TECHNIQUE: Duplex ultrasound using B-mode/gray scaled imaging and Doppler spectral analysis and color flow was obtained of the deep venous structures of the left lower extremity. COMPARISON: None available. CLINICAL HISTORY: Swelling. FINDINGS: There is no evidence of deep venous thrombosis within the left lower extremity. The common femoral vein, femoral vein, and popliteal vein of the left lower extremity demonstrate normal compressibility with normal color flow and spectral analysis. There is limited visualization of the left posterior tibial veins and the left peroneal veins are not identified. IMPRESSION: 1. No evidence of DVT in the left lower extremity. 2. Limited visualization of the left posterior tibial veins and the left peroneal veins are not identified. Electronically signed by: Evalene Coho MD 01/27/2024 05:43 AM EST RP Workstation: [id number]   DG Chest Port 1 View Result Date: 01/26/2024 CLINICAL DATA:  Questionable sepsis EXAM: PORTABLE CHEST 1 VIEW COMPARISON:  Chest radiograph dated 07/07/2019. FINDINGS: No focal consolidation, pleural effusion or pneumothorax. Top-normal cardiac size. No acute osseous pathology. IMPRESSION: No active disease. Electronically Signed   By: Vanetta Chou M.D.    On: 01/26/2024 13:58    ECHO as above  TELEMETRY (personally reviewed): Sinus rhythm rate 70s  EKG (personally reviewed): Sinus rhythm rate 81 bpm  Data reviewed by me 02/04/2024: last 24h vitals tele labs imaging I/O ED provider note, admission H&P, hospitalist progress note, ID note, nephrology note  Principal Problem:   Sepsis due to cellulitis [facility]) Active Problems:   Essential (primary) hypertension   Type 2 diabetes mellitus (HCC)   Chronic venous insufficiency   HLD (hyperlipidemia)   Morbid obesity with BMI of 50.0-59.9, adult (HCC)   OSA (obstructive sleep apnea)   Obesity hypoventilation syndrome (HCC)   (HFpEF) heart failure with preserved ejection fraction (HCC)   Bacteremia due to Pseudomonas   CO2 narcosis   Metabolic encephalopathy   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nostras verrucosa   Cellulitis of left lower extremity    ASSESSMENT AND PLAN:  LADANA CHAVERO is a 83 y.o. female  with a past medical history of COPD, chronic respiratory failure on 4 L, hypertension, hyperlipidemia, type 2 diabetes, obesity, obesity hypoventilation syndrome, history of venous insufficiency who presented to the ED on 01/26/2024 for concern for leg infection.  Noted to be in atrial fibrillation RVR initially, placed on IV amiodarone and ultimately converted to normal sinus rhythm.  Blood cultures were positive for Pseudomonas.  Cardiology was consulted for further evaluation.   # Acute on chronic hypoxic respiratory failure # Atrial fibrillation RVR # New onset atrial fibrillation # Cellulitis # Pseudomonas bacteremia # Hypertension Patient sent to the ED for evaluation of cellulitis, ultimately developed atrial fibrillation RVR which was treated with IV amiodarone and she converted to normal sinus rhythm yesterday.  Found to have positive blood cultures growing Pseudomonas.  Last week developed worsening hypoxia and was placed on BiPAP, now on nasal cannula.  Echo this admission with EF 60-65%, no  WMA's, mild MR. - Continue holding lasix  due to worsening renal function. - Continue p.o. amiodarone load with 400 mg twice daily for 7 days followed by 200 mg daily. - Continue Eliquis 2.5 mg twice daily (dose appropriate given age, renal function). - Continue pravastatin 20 mg daily. - Reviewed ID notes, no mention of TEE and TTE from this admission did not show any significant valvular issues. - Further management of cellulitis, bacteremia as per primary team.  This patient's plan of care was discussed and created with Dr. Florencio and he is in agreement.  Signed: Danita Bloch, PA-C  02/04/2024, 10:32 AM [facility]

## 2024-02-05 DIAGNOSIS — A419 Sepsis, unspecified organism: Secondary | ICD-10-CM | POA: Diagnosis not present

## 2024-02-05 DIAGNOSIS — L0390 Cellulitis, unspecified: Secondary | ICD-10-CM | POA: Diagnosis not present

## 2024-02-05 LAB — CBC WITH DIFFERENTIAL/PLATELET
Abs Immature Granulocytes: 0.1 K/uL — ABNORMAL HIGH (ref 0.00–0.07)
Basophils Absolute: 0.1 K/uL (ref 0.0–0.1)
Basophils Relative: 1 %
Eosinophils Absolute: 0.1 K/uL (ref 0.0–0.5)
Eosinophils Relative: 2 %
HCT: 30.2 % — ABNORMAL LOW (ref 36.0–46.0)
Hemoglobin: 9.3 g/dL — ABNORMAL LOW (ref 12.0–15.0)
Immature Granulocytes: 2 %
Lymphocytes Relative: 13 %
Lymphs Abs: 0.9 K/uL (ref 0.7–4.0)
MCH: 28 pg (ref 26.0–34.0)
MCHC: 30.8 g/dL (ref 30.0–36.0)
MCV: 91 fL (ref 80.0–100.0)
Monocytes Absolute: 0.8 K/uL (ref 0.1–1.0)
Monocytes Relative: 12 %
Neutro Abs: 4.9 K/uL (ref 1.7–7.7)
Neutrophils Relative %: 70 %
Platelets: 282 K/uL (ref 150–400)
RBC: 3.32 MIL/uL — ABNORMAL LOW (ref 3.87–5.11)
RDW: 13.2 % (ref 11.5–15.5)
WBC: 6.8 K/uL (ref 4.0–10.5)
nRBC: 0 % (ref 0.0–0.2)

## 2024-02-05 LAB — BASIC METABOLIC PANEL WITH GFR
Anion gap: 12 (ref 5–15)
BUN: 94 mg/dL — ABNORMAL HIGH (ref 8–23)
CO2: 34 mmol/L — ABNORMAL HIGH (ref 22–32)
Calcium: 9.2 mg/dL (ref 8.9–10.3)
Chloride: 84 mmol/L — ABNORMAL LOW (ref 98–111)
Creatinine, Ser: 3.55 mg/dL — ABNORMAL HIGH (ref 0.44–1.00)
GFR, Estimated: 12 mL/min — ABNORMAL LOW (ref >60)
Glucose, Bld: 113 mg/dL — ABNORMAL HIGH (ref 70–99)
Potassium: 4.9 mmol/L (ref 3.5–5.1)
Sodium: 130 mmol/L — ABNORMAL LOW (ref 135–145)

## 2024-02-05 LAB — GLUCOSE, CAPILLARY
Glucose-Capillary: 116 mg/dL — ABNORMAL HIGH (ref 70–99)
Glucose-Capillary: 148 mg/dL — ABNORMAL HIGH (ref 70–99)
Glucose-Capillary: 167 mg/dL — ABNORMAL HIGH (ref 70–99)
Glucose-Capillary: 159 mg/dL — ABNORMAL HIGH (ref 70–99)

## 2024-02-05 MED ORDER — ORAL CARE MOUTH RINSE: 15.0000 mL | OROMUCOSAL | Status: DC | PRN

## 2024-02-05 NOTE — Plan of Care (Signed)
  Problem: Fluid Volume: Goal: Hemodynamic stability will improve Outcome: Progressing   Problem: Clinical Measurements: Goal: Diagnostic test results will improve Outcome: Progressing Goal: Signs and symptoms of infection will decrease Outcome: Progressing   Problem: Respiratory: Goal: Ability to maintain adequate ventilation will improve Outcome: Progressing   Problem: Education: Goal: Knowledge of General Education information will improve Description: Including pain rating scale, medication(s)/side effects and non-pharmacologic comfort measures Outcome: Progressing   Problem: Health Behavior/Discharge Planning: Goal: Ability to manage health-related needs will improve Outcome: Progressing   Problem: Clinical Measurements: Goal: Ability to maintain clinical measurements within normal limits will improve Outcome: Progressing Goal: Will remain free from infection Outcome: Progressing Goal: Diagnostic test results will improve Outcome: Progressing Goal: Respiratory complications will improve Outcome: Progressing Goal: Cardiovascular complication will be avoided Outcome: Progressing   Problem: Activity: Goal: Risk for activity intolerance will decrease Outcome: Progressing   Problem: Nutrition: Goal: Adequate nutrition will be maintained Outcome: Progressing   Problem: Coping: Goal: Level of anxiety will decrease Outcome: Progressing   Problem: Elimination: Goal: Will not experience complications related to bowel motility Outcome: Progressing Goal: Will not experience complications related to urinary retention Outcome: Progressing   Problem: Pain Managment: Goal: General experience of comfort will improve and/or be controlled Outcome: Progressing   Problem: Safety: Goal: Ability to remain free from injury will improve Outcome: Progressing   Problem: Skin Integrity: Goal: Risk for impaired skin integrity will decrease Outcome: Progressing   Problem:  Clinical Measurements: Goal: Ability to avoid or minimize complications of infection will improve Outcome: Progressing   Problem: Skin Integrity: Goal: Skin integrity will improve Outcome: Progressing

## 2024-02-05 NOTE — Progress Notes (Signed)
 Progress Note   Patient: Kelly Hogan FMW:[id number] DOB: 11/13/1940 DOA: 01/26/2024     10 DOS: the patient was seen and examined on 02/05/2024      Brief hospital course:  From HPI : Kelly Hogan is a 83 y.o. 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   The rest of hospital course as noted below     11/11: admitted to hospitalist w/ hypotension, cellulitis. Metronidazole  and cefepime  in the ED. SVT with rates of 140s, pt requiring amio gtt.   11/12: (+)Pseudomonas bacteremia - linezolid  and vancomycin . Remains on amio gtt for now, cardiology will see in AM. Was off amio briefly this evening d/t limited IV access and needed other Rx, HR was WNL but then up again and back on the drip. Renal fxn worse, resp acidosis and hypercarbia, pt placed on BiPAP. Fluids changed to bicarb infusion 1L. Hyperkalemia, mild, gave K shifting Rx w/ insulin /D50, also admin Lokelma .  11/13: renal fxn continuing to worsen - Nephrology consulted. Pt remains on BiPAP this morning. Was off briefly per her request but O2 down and SOB so placed back on the BiPAP. BP soft, gave another 1L fluids. Cardiology - transition to po amio, starting eliquis . ID recs change ceftazidime  d/t myoclonus effects and confusion. Still worsening into the afternoon, CXR and BNP indicative of HFpEF w/ pulmonary edema despite fairly clear lungs (but exam difficult). Son is ok for trial of diuretics despite renal fxn.  11/14: good UOP and respiratory is improved onto [place] O2, renal fxn about same. Continuing on ceftazidime . Changed morphine  to dilaudid  w/ renal fxn. Renal US  no concerns. Cr 2.86. BCx repeated. Requiring BiPAP at night  11/15: somnolent but alerts to voice. Has not wanted to  sit up in bed/chair. Cr 2.79. UOP yesterday looks like under documented. Continue diuresis. Repeat BCx NG thus far.  11/16: Cr 2.32, up to chair today encouraging.  11/17: Cr 2.15, CO2 levels higher today on BMP, pt refusing BiPAP overnight, encouraged once again she needs to wear at night, needs to be up to chair as much as possible, incentive spirometer. Per ID since she has improved and wbc normalized can complete a total 7 days of IV antibiotic (ceftazidime )  11/18: much more alert today after being on BiPAP last night.    Consultants:  Cardiology  Infectious disease    Procedures/Surgeries: none   ASSESSMENT & PLAN:   Cellulitis of the left lower extremity. Present on admission and ruled out DVT (though US  exam somewhat limited) (+)Pseudomonas bacteremia Dc cefepime  d/t myoclonus / neuro effects.  Completed ceftazidime .  Has completed 7 days course of antibiotics Infectious disease on board   HFpEF Exacerbation d/t SVT + fluid tx per sepsis protocol  Echo noted preserved EF but unable to assess diastolic fxn, likely is dysfunctional. No significant valve dysfunction.  Cardiology is holding off diuresis at this time given worsening renal function Strict I&O Careful monitoring BMP / renal fxn --> improving  Cardiology following    SVT:  Amiodarone  po Eliquis  2.5 mg bid  Cardiologist on board in case discussed Echo as above       Acte on Chronic hypoxic/hypercarbic respiratory failure secondary to COPD and OSA and HFpEF, suspect restrictive disease / obesity hypoventilation  syndrome Resp support was escalated to BIPAP and now weaned down to [place] but requiring BiPAP at night Pt has been resistant to sitting upright to allow better chest expansion but was able to sit in chair awhile today  Incentive spirometry encouraged    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   Midodrine  discontinued hold antihypertensives. Cardiology following    Type 2 diabetes Monitor glc Relatively low glc, will hold on SSI for now   Hyperlipidemia:  statin   Chronic venous stasis Rec follow w/ lymphedema clinic    Skin nodularity bilateral lower extremities Question hyperkeratotic lesions? Keloid type lesions?  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  Healthy nutrition and physical activity advised as adjunct to other disease management and risk reduction treatments       DVT prophylaxis: lovenox    Code Status: DNR   TOC needs: SNF  Medical barriers to dispo: IV abx and bacteremia.      Subjective Currently on 4 L of oxygen .  Denies any acute overnight event  Family Communication: No family at bedside today   Physical Exam Constitutional: Elderly female sitting up in a chair mental status improved    Rate and Rhythm: Normal rate and regular rhythm.  Pulmonary: Decreased air entry bibasilarly Musculoskeletal:     Comments: Bilateral LE wraps were not disturbed, no proximal pitting edema or erythema   Skin: Neurological:     Mental Status: She is alert and oriented to person, place, and time. Mental status is at baseline.  Psychiatric:        Mood and Affect: Mood normal.        Behavior: Behavior normal.      Data reviewed:    Latest Ref Rng & Units 02/05/2024     3:52 AM 02/04/2024    3:19 AM 02/03/2024    4:15 AM  CBC  WBC 4.0 - 10.5 K/uL 6.8  7.2  6.7   Hemoglobin 12.0 - 15.0 g/dL 9.3  9.6  89.7   Hematocrit 36.0 - 46.0 % 30.2  31.5  33.7   Platelets 150 - 400 K/uL 282  290  300        Latest Ref Rng & Units 02/05/2024    3:52 AM 02/04/2024    3:19 AM 02/03/2024    4:15 AM  BMP  Glucose 70 - 99 mg/dL 886  889  876   BUN 8 - 23 mg/dL 94  84  71   Creatinine 0.44 - 1.00 mg/dL 6.44  6.38  7.05   Sodium 135 - 145 mmol/L 130  135  136   Potassium 3.5 - 5.1 mmol/L 4.9  4.8  4.8   Chloride 98 - 111 mmol/L 84  86  87   CO2 22 - 32 mmol/L 34  37  38  Calcium  8.9 - 10.3 mg/dL 9.2  9.5  89.8      Vitals:   02/05/24 0900 02/05/24 1207 02/05/24 1210 02/05/24 1528  BP:  (!) 122/47 (!) 111/56 110/60  Pulse:      Resp:      Temp: 98.1 F (36.7 C) 97.9 F (36.6 C)  98 F (36.7 C)  TempSrc: Oral Oral  Oral  SpO2:      Weight:      Height:          Latest Ref Rng & Units 02/05/2024    3:52 AM 02/04/2024    3:19 AM 02/03/2024    4:15 AM  CBC  WBC 4.0 - 10.5 K/uL 6.8  7.2  6.7   Hemoglobin 12.0 - 15.0 g/dL 9.3  9.6  89.7   Hematocrit 36.0 - 46.0 % 30.2  31.5  33.7   Platelets 150 - 400 K/uL 282  290  300        Latest Ref Rng & Units 02/05/2024    3:52 AM 02/04/2024    3:19 AM 02/03/2024    4:15 AM  BMP  Glucose 70 - 99 mg/dL 886  889  876   BUN 8 - 23 mg/dL 94  84  71   Creatinine 0.44 - 1.00 mg/dL 6.44  6.38  7.05   Sodium 135 - 145 mmol/L 130  135  136   Potassium 3.5 - 5.1 mmol/L 4.9  4.8  4.8   Chloride 98 - 111 mmol/L 84  86  87   CO2 22 - 32 mmol/L 34  37  38   Calcium  8.9 - 10.3 mg/dL 9.2  9.5  89.8      Author: Drue ONEIDA Potter, MD 02/05/2024 6:04 PM  For on call review [email or web address].

## 2024-02-05 NOTE — Progress Notes (Signed)
 [facility] Cardiology    SUBJECTIVE: Acute shortness of breath dyspnea chronic hypoxic failure with weakness fatigue shortness of breath lower extremity edema cellulitis requiring BiPAP denies any significant pain   Vitals:   02/05/24 0900 02/05/24 1207 02/05/24 1210 02/05/24 1528  BP:  (!) 122/47 (!) 111/56 110/60  Pulse:      Resp:      Temp: 98.1 F (36.7 C) 97.9 F (36.6 C)  98 F (36.7 C)  TempSrc: Oral Oral  Oral  SpO2:      Weight:      Height:         Intake/Output Summary (Last 24 hours) at 02/05/2024 1928 Last data filed at 02/05/2024 1423 Gross per 24 hour  Intake 840 ml  Output 700 ml  Net 140 ml      PHYSICAL EXAM  General: Well developed, well nourished, in no acute distress HEENT:  Normocephalic and atramatic Neck:  No JVD.  Lungs: Clear bilaterally to auscultation and percussion. Heart: Tachycardic normal S1 and S2 without gallops or murmurs.  Abdomen: Bowel sounds are positive, abdomen soft and non-tender  Msk:  Back normal, normal gait. Normal strength and tone for age. Extremities: No clubbing, cyanosis or 3+dema.  With redness Neuro: Alert and oriented X 3. Psych:  Good affect, responds appropriately   LABS: Basic Metabolic Panel: Recent Labs    02/04/24 0319 02/05/24 0352  NA 135 130*  K 4.8 4.9  CL 86* 84*  CO2 37* 34*  GLUCOSE 110* 113*  BUN 84* 94*  CREATININE 3.61* 3.55*  CALCIUM  9.5 9.2   Liver Function Tests: No results for input(s): AST, ALT, ALKPHOS, BILITOT, PROT, ALBUMIN  in the last 72 hours. No results for input(s): LIPASE, AMYLASE in the last 72 hours. CBC: Recent Labs    02/04/24 0319 02/05/24 0352  WBC 7.2 6.8  NEUTROABS 5.0 4.9  HGB 9.6* 9.3*  HCT 31.5* 30.2*  MCV 91.3 91.0  PLT 290 282   Cardiac Enzymes: No results for input(s): CKTOTAL, CKMB, CKMBINDEX, TROPONINI in the last 72 hours. BNP: Invalid input(s): POCBNP D-Dimer: No results for input(s): DDIMER in the last 72  hours. Hemoglobin A1C: No results for input(s): HGBA1C in the last 72 hours. Fasting Lipid Panel: No results for input(s): CHOL, HDL, LDLCALC, TRIG, CHOLHDL, LDLDIRECT in the last 72 hours. Thyroid Function Tests: No results for input(s): TSH, T4TOTAL, T3FREE, THYROIDAB in the last 72 hours.  Invalid input(s): FREET3 Anemia Panel: No results for input(s): VITAMINB12, FOLATE, FERRITIN, TIBC, IRON, RETICCTPCT in the last 72 hours.  No results found.   Echo preserved left ventricular function diastolic dysfunction EF of 60%  TELEMETRY: Sinus tachycardia rate of 101:  ASSESSMENT AND PLAN:  Principal Problem:   Sepsis due to cellulitis [facility]) Active Problems:   Essential (primary) hypertension   Type 2 diabetes mellitus (HCC)   Chronic venous insufficiency   HLD (hyperlipidemia)   Morbid obesity with BMI of 50.0-59.9, adult (HCC)   OSA (obstructive sleep apnea)   Obesity hypoventilation syndrome (HCC)   (HFpEF) heart failure with preserved ejection fraction (HCC)   Bacteremia due to Pseudomonas   CO2 narcosis   Metabolic encephalopathy   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nostras verrucosa   Cellulitis of left lower extremity    Plan Acute on chronic HFpEF continue diuretic therapy but has significant renal insufficiency appreciate nephrology input SVT on amiodarone  Eliquis  continue current management rate control Acute on chronic hypercapnic hypoxic respiratory failure secondary to COPD obstructive sleep apnea HFpEF continue inhalers supplemental oxygen  as necessary BiPAP as necessary consider pulmonary input Lower extremity cellulitis Pseudomonas bacteremia broad-spectrum antibiotic therapy for at least 7 days Obstructive sleep apnea by history recommend BiPAP therapy supplemental oxygen as necessary Obesity recommend modest weight loss exercise portion control Metabolic encephalopathy possibly related to hypercapnia and hypoxemia continue respiratory  support Continue current medical therapy no invasive cardiac procedures recommended   [name], MD 02/05/2024 7:28 PM

## 2024-02-05 NOTE — Plan of Care (Signed)
  Problem: Clinical Measurements: Goal: Signs and symptoms of infection will decrease Outcome: Progressing   Problem: Education: Goal: Knowledge of General Education information will improve Description: Including pain rating scale, medication(s)/side effects and non-pharmacologic comfort measures Outcome: Progressing   Problem: Clinical Measurements: Goal: Cardiovascular complication will be avoided Outcome: Progressing   Problem: Coping: Goal: Level of anxiety will decrease Outcome: Progressing   Problem: Pain Managment: Goal: General experience of comfort will improve and/or be controlled Outcome: Progressing

## 2024-02-05 NOTE — Progress Notes (Signed)
 Mobility Specialist - Progress Note  Pre-mobility: HR-76,  SpO2-92%  During mobility: HR-88, SpO2-97%  Post-mobility: HR-84,  SPO2-98%   02/05/24 1210  Therapy Vitals  BP (!) 111/56  Mobility  Activity Stood at bedside;Dangled on edge of bed;Ambulated with assistance  Level of Assistance Moderate assist, patient does 50-74%  Assistive Device Four wheel walker  Distance Ambulated (ft) 2 ft  Range of Motion/Exercises Active Assistive;All extremities  Activity Response Tolerated well  Mobility visit 1 Mobility  Mobility Specialist Start Time (ACUTE ONLY) 1101  Mobility Specialist Stop Time (ACUTE ONLY) 1135  Mobility Specialist Time Calculation (min) (ACUTE ONLY) 34 min   Pt was supine in bed with the HOB elevated on O2 @ 3L with guest in the room upon entry. Pt agreed to mobility. Pt is able today to get to the EOB with minA CGA. Pt is able today to get to STS with modA CGA and 4 WW. Pt repeated activity and provide two ambulatory steps. After activity pt returned to bed supine with HOB elevated position needs within reach guest in room upon exit. Clem Rodes Mobility Specialist 02/05/24, 12:28 PM

## 2024-02-05 NOTE — Progress Notes (Signed)
 [facility]  ROUNDING NOTE   Subjective:   We have been reconsulted on this patient due to acute kidney injury. At time of sign off, creatinine was 2.15. Appears to begin to increase after that. No signs of decreased BP. Seen sitting up in bed with son at bedside. Remains on 4L [place].   Creatinine 3.55  Objective:  Vital signs in last 24 hours:  Temp:  [97.3 F (36.3 C)-98.5 F (36.9 C)] 97.9 F (36.6 C) (11/21 1207) Pulse Rate:  [73-75] 75 (11/21 0800) Resp:  [16-18] 16 (11/21 0800) BP: (105-128)/(47-56) 111/56 (11/21 1210) SpO2:  [97 %-98 %] 97 % (11/21 0800) FiO2 (%):  [36 %] 36 % (11/20 2111) Weight:  [124.4 kg] 124.4 kg (11/21 0435)  Weight change: -0.1 kg Filed Weights   02/03/24 0426 02/04/24 0557 02/05/24 0435  Weight: 124.5 kg 124.5 kg 124.4 kg    Intake/Output: I/O last 3 completed shifts: In: 483 [P.O.:480; I.V.:3] Out: -    Intake/Output this shift:  Total I/O In: 120 [P.O.:120] Out: 700 [Urine:700]  Physical Exam: General: NAD  Head: Normocephalic, atraumatic. Moist oral mucosal membranes  Eyes: Anicteric  Lungs:  Clear to auscultation, 4L [place]  Heart: Regular rate and rhythm  Abdomen:  Soft, nontender  Extremities:  No peripheral edema.  Neurologic: Awake, alert, conversant  Skin: Warm,dry, no rash  Access: None     Basic Metabolic Panel: Recent Labs  Lab 02/01/24 0306 02/02/24 0353 02/03/24 0415 02/04/24 0319 02/05/24 0352  NA 141 136 136 135 130*  K 4.2 4.1 4.8 4.8 4.9  CL 93* 86* 87* 86* 84*  CO2 40* 41* 38* 37* 34*  GLUCOSE 94 107* 123* 110* 113*  BUN 62* 63* 71* 84* 94*  CREATININE 2.15* 2.56* 2.94* 3.61* 3.55*  CALCIUM  10.3 10.3 10.1 9.5 9.2    Liver Function Tests: No results for input(s): AST, ALT, ALKPHOS, BILITOT, PROT, ALBUMIN  in the last 168 hours.  No results for input(s): LIPASE, AMYLASE in the last 168 hours. No results for input(s): AMMONIA in the last 168 hours.  CBC: Recent Labs   Lab 01/30/24 0411 02/02/24 0353 02/03/24 0415 02/04/24 0319 02/05/24 0352  WBC 11.3* 7.3 6.7 7.2 6.8  NEUTROABS  --   --   --  5.0 4.9  HGB 9.0* 9.8* 10.2* 9.6* 9.3*  HCT 28.7* 33.0* 33.7* 31.5* 30.2*  MCV 91.4 94.3 93.4 91.3 91.0  PLT 256 288 300 290 282    Cardiac Enzymes: No results for input(s): CKTOTAL, CKMB, CKMBINDEX, TROPONINI in the last 168 hours.  BNP: Invalid input(s): POCBNP  CBG: Recent Labs  Lab 02/04/24 1251 02/04/24 1755 02/04/24 2212 02/05/24 0756 02/05/24 1204  GLUCAP 154* 165* 204* 116* 148*    Microbiology: Results for orders placed or performed during the hospital encounter of 01/26/24  Resp panel by RT-PCR (RSV, Flu A&B, Covid) Anterior Nasal Swab     Status: None   Collection Time: 01/26/24 12:56 PM   Specimen: Anterior Nasal Swab  Result Value Ref Range Status   SARS Coronavirus 2 by RT PCR NEGATIVE NEGATIVE Final    Comment: (NOTE) SARS-CoV-2 target nucleic acids are NOT DETECTED.  The SARS-CoV-2 RNA is generally detectable in upper respiratory specimens during the acute phase of infection. The lowest concentration of SARS-CoV-2 viral copies this assay can detect is 138 copies/mL. A negative result does not preclude SARS-Cov-2 infection and should not be used as the sole basis for treatment or other patient management decisions. A negative  result may occur with  improper specimen collection/handling, submission of specimen other than nasopharyngeal swab, presence of viral mutation(s) within the areas targeted by this assay, and inadequate number of viral copies(<138 copies/mL). A negative result must be combined with clinical observations, patient history, and epidemiological information. The expected result is Negative.  Fact Sheet for Patients:  bloggercourse.com  Fact Sheet for Healthcare Providers:  seriousbroker.it  This test is no t yet approved or cleared by the  [place]  FDA and  has been authorized for detection and/or diagnosis of SARS-CoV-2 by FDA under an Emergency Use Authorization (EUA). This EUA will remain  in effect (meaning this test can be used) for the duration of the COVID-19 declaration under Section 564(b)(1) of the Act, 21 U.S.C.section 360bbb-3(b)(1), unless the authorization is terminated  or revoked sooner.       Influenza A by PCR NEGATIVE NEGATIVE Final   Influenza B by PCR NEGATIVE NEGATIVE Final    Comment: (NOTE) The Xpert Xpress SARS-CoV-2/FLU/RSV plus assay is intended as an aid in the diagnosis of influenza from Nasopharyngeal swab specimens and should not be used as a sole basis for treatment. Nasal washings and aspirates are unacceptable for Xpert Xpress SARS-CoV-2/FLU/RSV testing.  Fact Sheet for Patients: bloggercourse.com  Fact Sheet for Healthcare Providers: seriousbroker.it  This test is not yet approved or cleared by the [place]  FDA and has been authorized for detection and/or diagnosis of SARS-CoV-2 by FDA under an Emergency Use Authorization (EUA). This EUA will remain in effect (meaning this test can be used) for the duration of the COVID-19 declaration under Section 564(b)(1) of the Act, 21 U.S.C. section 360bbb-3(b)(1), unless the authorization is terminated or revoked.     Resp Syncytial Virus by PCR NEGATIVE NEGATIVE Final    Comment: (NOTE) Fact Sheet for Patients: bloggercourse.com  Fact Sheet for Healthcare Providers: seriousbroker.it  This test is not yet approved or cleared by the [place]  FDA and has been authorized for detection and/or diagnosis of SARS-CoV-2 by FDA under an Emergency Use Authorization (EUA). This EUA will remain in effect (meaning this test can be used) for the duration of the COVID-19 declaration under Section 564(b)(1) of the Act, 21 U.S.C. section  360bbb-3(b)(1), unless the authorization is terminated or revoked.  Performed at [facility], [address], [place], [place] [address]   Blood Culture (routine x 2)     Status: Abnormal   Collection Time: 01/26/24 12:56 PM   Specimen: BLOOD  Result Value Ref Range Status   Specimen Description   Final    BLOOD BLOOD RIGHT FOREARM Performed at [facility], [address], [place], [place] [address]    Special Requests   Final    BOTTLES DRAWN AEROBIC AND ANAEROBIC Blood Culture results may not be optimal due to an inadequate volume of blood received in culture bottles Performed at [facility], [address] [place], [place] [address]    Culture  Setup Time   Final    GRAM NEGATIVE RODS AEROBIC BOTTLE ONLY CRITICAL VALUE NOTED.  VALUE IS CONSISTENT WITH PREVIOUSLY REPORTED AND CALLED VALUE. Performed at [facility], [address] [place], [place] [address]    Culture (A)  Final    PSEUDOMONAS AERUGINOSA SUSCEPTIBILITIES PERFORMED ON PREVIOUS CULTURE WITHIN THE LAST 5 DAYS. Performed at [facility], [address], [place], [place] [address]    Report Status 01/29/2024 FINAL  Final  Blood Culture (routine x 2)  Status: Abnormal   Collection Time: 01/26/24  1:01 PM   Specimen: BLOOD  Result Value Ref Range Status   Specimen Description   Final    BLOOD RIGHT ARM Performed at [facility], [address], [place], [place] [address]    Special Requests   Final    BOTTLES DRAWN AEROBIC AND ANAEROBIC Blood Culture adequate volume Performed at [facility], [address] [place], [place] [address]    Culture  Setup Time   Final    GRAM NEGATIVE RODS AEROBIC BOTTLE ONLY CRITICAL RESULT CALLED TO, READ BACK BY AND VERIFIED WITH:  NATHAN BELUE AT 0541 01/27/24 JG Performed at [facility], [address], [place], [place] [address]    Culture PSEUDOMONAS AERUGINOSA (A)  Final   Report Status  01/29/2024 FINAL  Final   Organism ID, Bacteria PSEUDOMONAS AERUGINOSA  Final      Susceptibility   Pseudomonas aeruginosa - MIC*    MEROPENEM INTERMEDIATE Intermediate     CIPROFLOXACIN 0.5 SENSITIVE Sensitive     IMIPENEM >=16 RESISTANT Resistant     PIP/TAZO Value in next row Sensitive      8 SENSITIVEThis is a modified FDA-approved test that has been validated and its performance characteristics determined by the reporting laboratory.  This laboratory is certified under the Clinical Laboratory Improvement Amendments CLIA as qualified to perform high complexity clinical laboratory testing.    CEFEPIME  Value in next row Sensitive      8 SENSITIVEThis is a modified FDA-approved test that has been validated and its performance characteristics determined by the reporting laboratory.  This laboratory is certified under the Clinical Laboratory Improvement Amendments CLIA as qualified to perform high complexity clinical laboratory testing.    CEFTAZIDIME /AVIBACTAM Value in next row Sensitive      8 SENSITIVEThis is a modified FDA-approved test that has been validated and its performance characteristics determined by the reporting laboratory.  This laboratory is certified under the Clinical Laboratory Improvement Amendments CLIA as qualified to perform high complexity clinical laboratory testing.    CEFTOLOZANE/TAZOBACTAM Value in next row Sensitive      8 SENSITIVEThis is a modified FDA-approved test that has been validated and its performance characteristics determined by the reporting laboratory.  This laboratory is certified under the Clinical Laboratory Improvement Amendments CLIA as qualified to perform high complexity clinical laboratory testing.    TOBRAMYCIN Value in next row Sensitive      8 SENSITIVEThis is a modified FDA-approved test that has been validated and its performance characteristics determined by the reporting laboratory.  This laboratory is certified under the Clinical Laboratory  Improvement Amendments CLIA as qualified to perform high complexity clinical laboratory testing.    CEFTAZIDIME  Value in next row Sensitive      8 SENSITIVEThis is a modified FDA-approved test that has been validated and its performance characteristics determined by the reporting laboratory.  This laboratory is certified under the Clinical Laboratory Improvement Amendments CLIA as qualified to perform high complexity clinical laboratory testing.    * PSEUDOMONAS AERUGINOSA  Blood Culture ID Panel (Reflexed)     Status: Abnormal   Collection Time: 01/26/24  1:01 PM  Result Value Ref Range Status   Enterococcus faecalis NOT DETECTED NOT DETECTED Final   Enterococcus Faecium NOT DETECTED NOT DETECTED Final   Listeria monocytogenes NOT DETECTED NOT DETECTED Final   Staphylococcus species NOT DETECTED NOT DETECTED Final   Staphylococcus aureus (BCID) NOT DETECTED NOT DETECTED Final   Staphylococcus  epidermidis NOT DETECTED NOT DETECTED Final   Staphylococcus lugdunensis NOT DETECTED NOT DETECTED Final   Streptococcus species NOT DETECTED NOT DETECTED Final   Streptococcus agalactiae NOT DETECTED NOT DETECTED Final   Streptococcus pneumoniae NOT DETECTED NOT DETECTED Final   Streptococcus pyogenes NOT DETECTED NOT DETECTED Final   A.calcoaceticus-baumannii NOT DETECTED NOT DETECTED Final   Bacteroides fragilis NOT DETECTED NOT DETECTED Final   Enterobacterales NOT DETECTED NOT DETECTED Final   Enterobacter cloacae complex NOT DETECTED NOT DETECTED Final   Escherichia coli NOT DETECTED NOT DETECTED Final   Klebsiella aerogenes NOT DETECTED NOT DETECTED Final   Klebsiella oxytoca NOT DETECTED NOT DETECTED Final   Klebsiella pneumoniae NOT DETECTED NOT DETECTED Final   Proteus species NOT DETECTED NOT DETECTED Final   Salmonella species NOT DETECTED NOT DETECTED Final   Serratia marcescens NOT DETECTED NOT DETECTED Final   Haemophilus influenzae NOT DETECTED NOT DETECTED Final   Neisseria  meningitidis NOT DETECTED NOT DETECTED Final   Pseudomonas aeruginosa DETECTED (A) NOT DETECTED Final    Comment: CRITICAL RESULT CALLED TO, READ BACK BY AND VERIFIED WITH:  NATHAN BELUE AT 0541 01/27/24 JG    Stenotrophomonas maltophilia NOT DETECTED NOT DETECTED Final   Candida albicans NOT DETECTED NOT DETECTED Final   Candida auris NOT DETECTED NOT DETECTED Final   Candida glabrata NOT DETECTED NOT DETECTED Final   Candida krusei NOT DETECTED NOT DETECTED Final   Candida parapsilosis NOT DETECTED NOT DETECTED Final   Candida tropicalis NOT DETECTED NOT DETECTED Final   Cryptococcus neoformans/gattii NOT DETECTED NOT DETECTED Final   CTX-M ESBL NOT DETECTED NOT DETECTED Final   Carbapenem resistance IMP NOT DETECTED NOT DETECTED Final   Carbapenem resistance KPC NOT DETECTED NOT DETECTED Final   Carbapenem resistance NDM NOT DETECTED NOT DETECTED Final   Carbapenem resistance VIM NOT DETECTED NOT DETECTED Final    Comment: Performed at [facility], [address] [place], [place] [address]  Culture, blood (Routine X 2) w Reflex to ID Panel     Status: None   Collection Time: 01/29/24  4:38 AM   Specimen: BLOOD  Result Value Ref Range Status   Specimen Description BLOOD BLOOD LEFT ARM  Final   Special Requests   Final    BOTTLES DRAWN AEROBIC AND ANAEROBIC Blood Culture adequate volume   Culture   Final    NO GROWTH 5 DAYS Performed at [facility], [address] [place], [place] [address]    Report Status 02/03/2024 FINAL  Final  Culture, blood (Routine X 2) w Reflex to ID Panel     Status: None   Collection Time: 01/29/24  4:43 AM   Specimen: BLOOD  Result Value Ref Range Status   Specimen Description BLOOD BLOOD LEFT HAND  Final   Special Requests   Final    BOTTLES DRAWN AEROBIC AND ANAEROBIC Blood Culture adequate volume   Culture   Final    NO GROWTH 5 DAYS Performed at [facility], [address] [place], [place] [address]     Report Status 02/03/2024 FINAL  Final    Coagulation Studies: No results for input(s): LABPROT, INR in the last 72 hours.   Urinalysis: No results for input(s): COLORURINE, LABSPEC, PHURINE, GLUCOSEU, HGBUR, BILIRUBINUR, KETONESUR, PROTEINUR, UROBILINOGEN, NITRITE, LEUKOCYTESUR in the last 72 hours.  Invalid input(s): APPERANCEUR     Imaging: No results found.    Medications:      amiodarone   200 mg Oral Daily  amLODipine   5 mg Oral Daily   apixaban   2.5 mg Oral BID   budesonide -glycopyrrolate -formoterol   2 puff Inhalation BID   lactose free nutrition  237 mL Oral TID WC   pantoprazole   40 mg Oral Daily   polyethylene glycol  17 g Oral Daily   pravastatin   20 mg Oral QPM   sodium chloride  flush  3 mL Intravenous Q12H   acetaminophen  **OR** acetaminophen , bisacodyl , dextrose , HYDROmorphone  (DILAUDID ) injection, ondansetron  (ZOFRAN ) IV, mouth rinse, senna-docusate, sodium chloride   Assessment/ Plan:  Ms. Kelly Hogan is a 83 y.o.  female with  past medical history including hyperlipidemia, type 2 diabetes, hypertension, obesity, and COPD, who was admitted to [facility] on 01/26/2024 for SVT (supraventricular tachycardia) [I47.10] Cellulitis of left lower extremity [L03.116] Sepsis due to cellulitis (HCC) [L03.90, A41.9] Sepsis, due to unspecified organism, unspecified whether acute organ dysfunction present (HCC) [A41.9]   Acute kidney injury with mild proteinuria on chronic kidney disease stage IV. Baseline creatinine appears to be 1.83 with GFR 27 on December 08, 2023. Suspect there may be a cardiorenal component. BNP elevated. Renal US  unremarkable. Reconsulted due to elevated creatinine. Etiology unknown at this time. No hypotension noted. Will continue to monitor.    Lab Results  Component Value Date   CREATININE 3.55 (H) 02/05/2024   CREATININE 3.61 (H) 02/04/2024   CREATININE 2.94 (H) 02/03/2024    Intake/Output Summary (Last 24  hours) at 02/05/2024 1332 Last data filed at 02/05/2024 1152 Gross per 24 hour  Intake 120 ml  Output 700 ml  Net -580 ml   2. Anemia of chronic kidney disease Lab Results  Component Value Date   HGB 9.3 (L) 02/05/2024    Hgb acceptable. Will continue to monitor for need of ESA.    3.  Hypertension with chronic kidney disease.  Now receiving amlodipine .     LOS: 10 Daivion [place]ape 11/21/20251:32 PM

## 2024-02-05 NOTE — TOC Progression Note (Signed)
 Transition of Care [facility]) - Progression Note    Patient Details  Name: Kelly Hogan MRN: [id number] Date of Birth: May 17, 1940  Transition of [facility] CM/SW Contact  Alfonso Rummer, LCSW Phone Number: 02/05/2024, 4:02 PM  Clinical Narrative:     Pt insurance authorization completed. LCSW A. Rummer informed [facility] to order pt bipap for possible transition to [facility] on Monday 11/24, Brianna with [facility] reports understanding request.   Expected Discharge Plan: Skilled Nursing Facility Barriers to Discharge: Continued Medical Work up               Expected Discharge Plan and Services       Living arrangements for the past 2 months: Single Family Home                                       Social Drivers of Health (SDOH) Interventions SDOH Screenings   Food Insecurity: Patient Declined (01/27/2024)  Housing: Patient Declined (01/27/2024)  Transportation Needs: Patient Declined (01/27/2024)  Utilities: Patient Declined (01/27/2024)  Financial Resource Strain: Low Risk (08/12/2023)   Received from [facility]  Social Connections: Patient Declined (01/27/2024)  Stress: No Stress Concern Present (11/17/2023)   Received from [facility]  Tobacco Use: Medium Risk (12/21/2023)   Received from [facility] Literacy: Low Risk (07/06/2023)   Received from [facility]    Readmission Risk Interventions     No data to display

## 2024-02-06 DIAGNOSIS — I5030 Unspecified diastolic (congestive) heart failure: Secondary | ICD-10-CM | POA: Diagnosis not present

## 2024-02-06 DIAGNOSIS — A419 Sepsis, unspecified organism: Secondary | ICD-10-CM | POA: Diagnosis not present

## 2024-02-06 DIAGNOSIS — L0390 Cellulitis, unspecified: Secondary | ICD-10-CM | POA: Diagnosis not present

## 2024-02-06 LAB — CBC WITH DIFFERENTIAL/PLATELET
Abs Immature Granulocytes: 0.07 K/uL (ref 0.00–0.07)
Basophils Absolute: 0.1 K/uL (ref 0.0–0.1)
Basophils Relative: 1 %
Eosinophils Absolute: 0.1 K/uL (ref 0.0–0.5)
Eosinophils Relative: 2 %
HCT: 30.9 % — ABNORMAL LOW (ref 36.0–46.0)
Hemoglobin: 9.4 g/dL — ABNORMAL LOW (ref 12.0–15.0)
Immature Granulocytes: 1 %
Lymphocytes Relative: 14 %
Lymphs Abs: 1 K/uL (ref 0.7–4.0)
MCH: 27.7 pg (ref 26.0–34.0)
MCHC: 30.4 g/dL (ref 30.0–36.0)
MCV: 91.2 fL (ref 80.0–100.0)
Monocytes Absolute: 0.8 K/uL (ref 0.1–1.0)
Monocytes Relative: 11 %
Neutro Abs: 5.2 K/uL (ref 1.7–7.7)
Neutrophils Relative %: 71 %
Platelets: 281 K/uL (ref 150–400)
RBC: 3.39 MIL/uL — ABNORMAL LOW (ref 3.87–5.11)
RDW: 13.3 % (ref 11.5–15.5)
WBC: 7.3 K/uL (ref 4.0–10.5)
nRBC: 0 % (ref 0.0–0.2)

## 2024-02-06 LAB — BASIC METABOLIC PANEL WITH GFR
Anion gap: 11 (ref 5–15)
BUN: 93 mg/dL — ABNORMAL HIGH (ref 8–23)
CO2: 34 mmol/L — ABNORMAL HIGH (ref 22–32)
Calcium: 9.4 mg/dL (ref 8.9–10.3)
Chloride: 86 mmol/L — ABNORMAL LOW (ref 98–111)
Creatinine, Ser: 3.27 mg/dL — ABNORMAL HIGH (ref 0.44–1.00)
GFR, Estimated: 14 mL/min — ABNORMAL LOW (ref >60)
Glucose, Bld: 113 mg/dL — ABNORMAL HIGH (ref 70–99)
Potassium: 4.9 mmol/L (ref 3.5–5.1)
Sodium: 131 mmol/L — ABNORMAL LOW (ref 135–145)

## 2024-02-06 LAB — GLUCOSE, CAPILLARY
Glucose-Capillary: 168 mg/dL — ABNORMAL HIGH (ref 70–99)
Glucose-Capillary: 140 mg/dL — ABNORMAL HIGH (ref 70–99)
Glucose-Capillary: 113 mg/dL — ABNORMAL HIGH (ref 70–99)
Glucose-Capillary: 169 mg/dL — ABNORMAL HIGH (ref 70–99)

## 2024-02-06 NOTE — Plan of Care (Signed)
  Problem: Fluid Volume: Goal: Hemodynamic stability will improve Outcome: Progressing   Problem: Clinical Measurements: Goal: Diagnostic test results will improve Outcome: Progressing Goal: Signs and symptoms of infection will decrease Outcome: Progressing   Problem: Respiratory: Goal: Ability to maintain adequate ventilation will improve Outcome: Progressing   Problem: Education: Goal: Knowledge of General Education information will improve Description: Including pain rating scale, medication(s)/side effects and non-pharmacologic comfort measures Outcome: Progressing   Problem: Health Behavior/Discharge Planning: Goal: Ability to manage health-related needs will improve Outcome: Progressing   Problem: Clinical Measurements: Goal: Ability to maintain clinical measurements within normal limits will improve Outcome: Progressing Goal: Will remain free from infection Outcome: Progressing Goal: Diagnostic test results will improve Outcome: Progressing Goal: Respiratory complications will improve Outcome: Progressing Goal: Cardiovascular complication will be avoided Outcome: Progressing   Problem: Activity: Goal: Risk for activity intolerance will decrease Outcome: Progressing   Problem: Nutrition: Goal: Adequate nutrition will be maintained Outcome: Progressing   Problem: Coping: Goal: Level of anxiety will decrease Outcome: Progressing   Problem: Elimination: Goal: Will not experience complications related to bowel motility Outcome: Progressing Goal: Will not experience complications related to urinary retention Outcome: Progressing   Problem: Pain Managment: Goal: General experience of comfort will improve and/or be controlled Outcome: Progressing   Problem: Safety: Goal: Ability to remain free from injury will improve Outcome: Progressing   Problem: Skin Integrity: Goal: Risk for impaired skin integrity will decrease Outcome: Progressing   Problem:  Clinical Measurements: Goal: Ability to avoid or minimize complications of infection will improve Outcome: Progressing   Problem: Skin Integrity: Goal: Skin integrity will improve Outcome: Progressing

## 2024-02-06 NOTE — Progress Notes (Signed)
 Progress Note   Patient: Kelly Hogan FMW:[id number] DOB: [birth year] DOA: 01/26/2024     11 DOS: the patient was seen and examined on 02/06/2024      Brief hospital course:  From HPI : Kelly Hogan is a 83 y.o. 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   The rest of hospital course as noted below     11/11: admitted to hospitalist w/ hypotension, cellulitis. Metronidazole  and cefepime  in the ED. SVT with rates of 140s, pt requiring amio gtt.   11/12: (+)Pseudomonas bacteremia - linezolid  and vancomycin . Remains on amio gtt for now, cardiology will see in AM. Was off amio briefly this evening d/t limited IV access and needed other Rx, HR was WNL but then up again and back on the drip. Renal fxn worse, resp acidosis and hypercarbia, pt placed on BiPAP. Fluids changed to bicarb infusion 1L. Hyperkalemia, mild, gave K shifting Rx w/ insulin /D50, also admin Lokelma .  11/13: renal fxn continuing to worsen - Nephrology consulted. Pt remains on BiPAP this morning. Was off briefly per her request but O2 down and SOB so placed back on the BiPAP. BP soft, gave another 1L fluids. Cardiology - transition to po amio, starting eliquis . ID recs change ceftazidime  d/t myoclonus effects and confusion. Still worsening into the afternoon, CXR and BNP indicative of HFpEF w/ pulmonary edema despite fairly clear lungs (but exam difficult). Son is ok for trial of diuretics despite renal fxn.  11/14: good UOP and respiratory is improved onto [place] O2, renal fxn about same. Continuing on ceftazidime . Changed morphine  to dilaudid  w/ renal fxn. Renal US  no concerns. Cr 2.86. BCx repeated. Requiring BiPAP at night  11/15: somnolent but alerts to voice. Has not wanted to  sit up in bed/chair. Cr 2.79. UOP yesterday looks like under documented. Continue diuresis. Repeat BCx NG thus far.  11/16: Cr 2.32, up to chair today encouraging.  11/17: Cr 2.15, CO2 levels higher today on BMP, pt refusing BiPAP overnight, encouraged once again she needs to wear at night, needs to be up to chair as much as possible, incentive spirometer. Per ID since she has improved and wbc normalized can complete a total 7 days of IV antibiotic (ceftazidime )  11/18: much more alert today after being on BiPAP last night.    Consultants:  Cardiology  Infectious disease    Procedures/Surgeries: none   ASSESSMENT & PLAN:   Cellulitis of the left lower extremity. Present on admission and ruled out DVT (though US  exam somewhat limited) (+)Pseudomonas bacteremia Dc cefepime  d/t myoclonus / neuro effects.  Completed ceftazidime .  Has completed 7 days course of antibiotics Infectious disease on board   HFpEF Exacerbation d/t SVT + fluid tx per sepsis protocol  Echo noted preserved EF but unable to assess diastolic fxn, likely is dysfunctional. No significant valve dysfunction.  Cardiology is holding off diuresis at this time given worsening renal function Strict I&O Careful monitoring BMP / renal fxn --> improving  Cardiology following    SVT:  Amiodarone  po Eliquis  2.5 mg bid  Cardiologist on board in case discussed Echo as above       Acte on Chronic hypoxic/hypercarbic respiratory failure secondary to COPD and OSA and HFpEF, suspect restrictive disease / obesity hypoventilation  syndrome Resp support was escalated to BIPAP and now weaned down to [place] but requiring BiPAP at night Pt has been resistant to sitting upright to allow better chest expansion but was able to sit in chair awhile today  Incentive spirometry encouraged    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   Midodrine  discontinued hold antihypertensives. Cardiology following    Type 2 diabetes Monitor glc Relatively low glc, will hold on SSI for now   Hyperlipidemia:  statin   Chronic venous stasis Rec follow w/ lymphedema clinic    Skin nodularity bilateral lower extremities Question hyperkeratotic lesions? Keloid type lesions?  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  Healthy nutrition and physical activity advised as adjunct to other disease management and risk reduction treatments       DVT prophylaxis: lovenox    Code Status: DNR   TOC needs: SNF  Medical barriers to dispo: IV abx and bacteremia.      Subjective Renal function continues to improve Denies any acute overnight event Nephrologist and cardiologist on board   Family Communication: No family at bedside today   Physical Exam Constitutional: Elderly female sitting up in a chair mental status improved    Rate and Rhythm: Normal rate and regular rhythm.  Pulmonary: Decreased air entry bibasilarly Musculoskeletal:     Comments: Bilateral LE wraps were not disturbed, no proximal pitting edema or erythema   Skin: Neurological:     Mental Status: She is alert and oriented to person, place, and time. Mental status is at baseline.  Psychiatric:        Mood and Affect: Mood normal.        Behavior: Behavior normal.      Data  reviewed:  Vitals:   02/05/24 2345 02/06/24 0500 02/06/24 0803 02/06/24 1142  BP: (!) 128/56  (!) 118/58 123/66  Pulse: 70  69 71  Resp:   13 18  Temp: 98 F (36.7 C)  98 F (36.7 C) 98.5 F (36.9 C)  TempSrc:   Oral Oral  SpO2:   97% 99%  Weight:  129.5 kg    Height:          Latest Ref Rng & Units 02/06/2024    4:25 AM 02/05/2024    3:52 AM 02/04/2024    3:19 AM  CBC  WBC 4.0 - 10.5 K/uL 7.3  6.8  7.2   Hemoglobin 12.0 - 15.0 g/dL 9.4  9.3  9.6   Hematocrit 36.0 - 46.0 % 30.9  30.2  31.5   Platelets 150 - 400 K/uL 281  282  290        Latest Ref Rng & Units 02/06/2024    4:25 AM 02/05/2024    3:52 AM 02/04/2024    3:19 AM  BMP  Glucose 70 - 99 mg/dL  113  113  110   BUN 8 - 23 mg/dL 93  94  84   Creatinine 0.44 - 1.00 mg/dL 6.72  6.44  6.38   Sodium 135 - 145 mmol/L 131  130  135   Potassium 3.5 - 5.1 mmol/L 4.9  4.9  4.8   Chloride 98 - 111 mmol/L 86  84  86   CO2 22 - 32 mmol/L 34  34  37   Calcium  8.9 - 10.3 mg/dL 9.4  9.2  9.5      Author: Drue ONEIDA Potter, MD 02/06/2024 4:54 PM  For on call review [email or web address].

## 2024-02-06 NOTE — Progress Notes (Signed)
 SUBJECTIVE: Patient is sleeping and appears to be comfortable.   Vitals:   02/05/24 2345 02/06/24 0500 02/06/24 0803 02/06/24 1142  BP: (!) 128/56  (!) 118/58 123/66  Pulse: 70  69 71  Resp:   13 18  Temp: 98 F (36.7 C)  98 F (36.7 C) 98.5 F (36.9 C)  TempSrc:   Oral Oral  SpO2:   97% 99%  Weight:  129.5 kg    Height:        Intake/Output Summary (Last 24 hours) at 02/06/2024 1350 Last data filed at 02/06/2024 0900 Gross per 24 hour  Intake 240 ml  Output --  Net 240 ml    LABS: Basic Metabolic Panel: Recent Labs    02/05/24 0352 02/06/24 0425  NA 130* 131*  K 4.9 4.9  CL 84* 86*  CO2 34* 34*  GLUCOSE 113* 113*  BUN 94* 93*  CREATININE 3.55* 3.27*  CALCIUM  9.2 9.4   Liver Function Tests: No results for input(s): AST, ALT, ALKPHOS, BILITOT, PROT, ALBUMIN  in the last 72 hours. No results for input(s): LIPASE, AMYLASE in the last 72 hours. CBC: Recent Labs    02/05/24 0352 02/06/24 0425  WBC 6.8 7.3  NEUTROABS 4.9 5.2  HGB 9.3* 9.4*  HCT 30.2* 30.9*  MCV 91.0 91.2  PLT 282 281   Cardiac Enzymes: No results for input(s): CKTOTAL, CKMB, CKMBINDEX, TROPONINI in the last 72 hours. BNP: Invalid input(s): POCBNP D-Dimer: No results for input(s): DDIMER in the last 72 hours. Hemoglobin A1C: No results for input(s): HGBA1C in the last 72 hours. Fasting Lipid Panel: No results for input(s): CHOL, HDL, LDLCALC, TRIG, CHOLHDL, LDLDIRECT in the last 72 hours. Thyroid Function Tests: No results for input(s): TSH, T4TOTAL, T3FREE, THYROIDAB in the last 72 hours.  Invalid input(s): FREET3 Anemia Panel: No results for input(s): VITAMINB12, FOLATE, FERRITIN, TIBC, IRON, RETICCTPCT in the last 72 hours.   PHYSICAL EXAM General: Well developed, well nourished, in no acute distress HEENT:  Normocephalic and atramatic Neck:  No JVD.  Lungs: Clear bilaterally to auscultation and percussion. Heart:  HRRR . Normal S1 and S2 without gallops or murmurs.  Abdomen: Bowel sounds are positive, abdomen soft and non-tender  Msk:  Back normal, normal gait. Normal strength and tone for age. Extremities: No clubbing, cyanosis or edema.   Neuro: Alert and oriented X 3. Psych:  Good affect, responds appropriately  TELEMETRY: Sinus rhythm  ASSESSMENT AND PLAN: Kelly Hogan is a 83 y.o. female  with a past medical history of COPD, chronic respiratory failure on 4 L, hypertension, hyperlipidemia, type 2 diabetes, obesity, obesity hypoventilation syndrome, history of venous insufficiency who presented to the ED on 01/26/2024 for concern for leg infection.  Noted to be in atrial fibrillation RVR initially, placed on IV amiodarone  and ultimately converted to normal sinus rhythm.  Blood cultures were positive for Pseudomonas.  Cardiology was consulted for further evaluation.    # Acute on chronic hypoxic respiratory failure # Atrial fibrillation RVR # New onset atrial fibrillation # Cellulitis # Pseudomonas bacteremia # Hypertension Patient sent to the ED for evaluation of cellulitis, ultimately developed atrial fibrillation RVR which was treated with IV amiodarone  and she converted to normal sinus rhythm yesterday.  Found to have positive blood cultures growing Pseudomonas.  Last week developed worsening hypoxia and was placed on BiPAP, now on nasal cannula.  Echo this admission with EF 60-65%, no WMA's, mild MR. - Continue holding lasix  due to worsening renal function. - Continue  p.o. amiodarone  load with 400 mg twice daily for 7 days followed by 200 mg daily. - Continue Eliquis  2.5 mg twice daily (dose appropriate given age, renal function). - Continue pravastatin  20 mg daily. - Reviewed ID notes, no mention of TEE and TTE from this admission did not show any significant valvular issues.   ICD-10-CM   1. Sepsis, due to unspecified organism, unspecified whether acute organ dysfunction present (HCC)   A41.9     2. SVT (supraventricular tachycardia)  I47.10     3. Cellulitis of left lower extremity  L03.116       Principal Problem:   Sepsis due to cellulitis [facility]) Active Problems:   Essential (primary) hypertension   Type 2 diabetes mellitus (HCC)   Chronic venous insufficiency   HLD (hyperlipidemia)   Morbid obesity with BMI of 50.0-59.9, adult (HCC)   OSA (obstructive sleep apnea)   Obesity hypoventilation syndrome (HCC)   (HFpEF) heart failure with preserved ejection fraction (HCC)   Bacteremia due to Pseudomonas   CO2 narcosis   Metabolic encephalopathy   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nostras verrucosa   Cellulitis of left lower extremity    Denyse Bathe, MD, FACC 02/06/2024 1:50 PM

## 2024-02-06 NOTE — Progress Notes (Signed)
 [facility]  ROUNDING NOTE   Subjective:   No acute events overnight. Patient denies discomfort or pain. Patient back to baseline oxygen of 4L [place]. Creatinine 3.27  Objective:  Vital signs in last 24 hours:  Temp:  [98 F (36.7 C)-98.7 F (37.1 C)] 98.5 F (36.9 C) (11/22 1142) Pulse Rate:  [69-74] 71 (11/22 1142) Resp:  [13-18] 18 (11/22 1142) BP: (110-128)/(54-66) 123/66 (11/22 1142) SpO2:  [96 %-99 %] 99 % (11/22 1142) FiO2 (%):  [36 %] 36 % (11/21 2320) Weight:  [129.5 kg] 129.5 kg (11/22 0500)  Weight change: 5.1 kg Filed Weights   02/04/24 0557 02/05/24 0435 02/06/24 0500  Weight: 124.5 kg 124.4 kg 129.5 kg    Intake/Output: I/O last 3 completed shifts: In: 840 [P.O.:840] Out: 700 [Urine:700]   Intake/Output this shift:  No intake/output data recorded.  Physical Exam: General: NAD  Head: Normocephalic  Eyes: Anicteric  Lungs:  Clear to auscultation, 4L [place]  Heart: Regular rate  Abdomen:  Soft, nontender  Extremities:  Legs bilateral wrapped  Neurologic: Awake, alert, conversant  Skin: Warm,dry, no rash  Access: None     Basic Metabolic Panel: Recent Labs  Lab 02/02/24 0353 02/03/24 0415 02/04/24 0319 02/05/24 0352 02/06/24 0425  NA 136 136 135 130* 131*  K 4.1 4.8 4.8 4.9 4.9  CL 86* 87* 86* 84* 86*  CO2 41* 38* 37* 34* 34*  GLUCOSE 107* 123* 110* 113* 113*  BUN 63* 71* 84* 94* 93*  CREATININE 2.56* 2.94* 3.61* 3.55* 3.27*  CALCIUM  10.3 10.1 9.5 9.2 9.4    Liver Function Tests: No results for input(s): AST, ALT, ALKPHOS, BILITOT, PROT, ALBUMIN  in the last 168 hours.  No results for input(s): LIPASE, AMYLASE in the last 168 hours. No results for input(s): AMMONIA in the last 168 hours.  CBC: Recent Labs  Lab 02/02/24 0353 02/03/24 0415 02/04/24 0319 02/05/24 0352 02/06/24 0425  WBC 7.3 6.7 7.2 6.8 7.3  NEUTROABS  --   --  5.0 4.9 5.2  HGB 9.8* 10.2* 9.6* 9.3* 9.4*  HCT 33.0* 33.7* 31.5* 30.2* 30.9*   MCV 94.3 93.4 91.3 91.0 91.2  PLT 288 300 290 282 281    Cardiac Enzymes: No results for input(s): CKTOTAL, CKMB, CKMBINDEX, TROPONINI in the last 168 hours.  BNP: Invalid input(s): POCBNP  CBG: Recent Labs  Lab 02/05/24 1204 02/05/24 1630 02/05/24 2106 02/06/24 0810 02/06/24 1144  GLUCAP 148* 167* 159* 168* 140*    Microbiology: Results for orders placed or performed during the hospital encounter of 01/26/24  Resp panel by RT-PCR (RSV, Flu A&B, Covid) Anterior Nasal Swab     Status: None   Collection Time: 01/26/24 12:56 PM   Specimen: Anterior Nasal Swab  Result Value Ref Range Status   SARS Coronavirus 2 by RT PCR NEGATIVE NEGATIVE Final    Comment: (NOTE) SARS-CoV-2 target nucleic acids are NOT DETECTED.  The SARS-CoV-2 RNA is generally detectable in upper respiratory specimens during the acute phase of infection. The lowest concentration of SARS-CoV-2 viral copies this assay can detect is 138 copies/mL. A negative result does not preclude SARS-Cov-2 infection and should not be used as the sole basis for treatment or other patient management decisions. A negative result may occur with  improper specimen collection/handling, submission of specimen other than nasopharyngeal swab, presence of viral mutation(s) within the areas targeted by this assay, and inadequate number of viral copies(<138 copies/mL). A negative result must be combined with clinical observations, patient history,  and epidemiological information. The expected result is Negative.  Fact Sheet for Patients:  bloggercourse.com  Fact Sheet for Healthcare Providers:  seriousbroker.it  This test is no t yet approved or cleared by the [place]  FDA and  has been authorized for detection and/or diagnosis of SARS-CoV-2 by FDA under an Emergency Use Authorization (EUA). This EUA will remain  in effect (meaning this test can be used) for the  duration of the COVID-19 declaration under Section 564(b)(1) of the Act, 21 U.S.C.section 360bbb-3(b)(1), unless the authorization is terminated  or revoked sooner.       Influenza A by PCR NEGATIVE NEGATIVE Final   Influenza B by PCR NEGATIVE NEGATIVE Final    Comment: (NOTE) The Xpert Xpress SARS-CoV-2/FLU/RSV plus assay is intended as an aid in the diagnosis of influenza from Nasopharyngeal swab specimens and should not be used as a sole basis for treatment. Nasal washings and aspirates are unacceptable for Xpert Xpress SARS-CoV-2/FLU/RSV testing.  Fact Sheet for Patients: bloggercourse.com  Fact Sheet for Healthcare Providers: seriousbroker.it  This test is not yet approved or cleared by the [place]  FDA and has been authorized for detection and/or diagnosis of SARS-CoV-2 by FDA under an Emergency Use Authorization (EUA). This EUA will remain in effect (meaning this test can be used) for the duration of the COVID-19 declaration under Section 564(b)(1) of the Act, 21 U.S.C. section 360bbb-3(b)(1), unless the authorization is terminated or revoked.     Resp Syncytial Virus by PCR NEGATIVE NEGATIVE Final    Comment: (NOTE) Fact Sheet for Patients: bloggercourse.com  Fact Sheet for Healthcare Providers: seriousbroker.it  This test is not yet approved or cleared by the [place]  FDA and has been authorized for detection and/or diagnosis of SARS-CoV-2 by FDA under an Emergency Use Authorization (EUA). This EUA will remain in effect (meaning this test can be used) for the duration of the COVID-19 declaration under Section 564(b)(1) of the Act, 21 U.S.C. section 360bbb-3(b)(1), unless the authorization is terminated or revoked.  Performed at [facility], [address], [place], [place] [address]   Blood Culture (routine x 2)     Status: Abnormal    Collection Time: 01/26/24 12:56 PM   Specimen: BLOOD  Result Value Ref Range Status   Specimen Description   Final    BLOOD BLOOD RIGHT FOREARM Performed at [facility], [address], [place], [place] [address]    Special Requests   Final    BOTTLES DRAWN AEROBIC AND ANAEROBIC Blood Culture results may not be optimal due to an inadequate volume of blood received in culture bottles Performed at [facility], [address] [place], [place] [address]    Culture  Setup Time   Final    GRAM NEGATIVE RODS AEROBIC BOTTLE ONLY CRITICAL VALUE NOTED.  VALUE IS CONSISTENT WITH PREVIOUSLY REPORTED AND CALLED VALUE. Performed at [facility], [address] [place], [place] [address]    Culture (A)  Final    PSEUDOMONAS AERUGINOSA SUSCEPTIBILITIES PERFORMED ON PREVIOUS CULTURE WITHIN THE LAST 5 DAYS. Performed at [facility], [address] [place], [place] [address]    Report Status 01/29/2024 FINAL  Final  Blood Culture (routine x 2)     Status: Abnormal   Collection Time: 01/26/24  1:01 PM   Specimen: BLOOD  Result Value Ref Range Status   Specimen Description   Final    BLOOD RIGHT ARM Performed at [facility], [address] [place],  [place] [address]    Special Requests   Final    BOTTLES DRAWN AEROBIC AND ANAEROBIC Blood Culture adequate volume Performed at [facility], [address] [place], [place] [address]    Culture  Setup Time   Final    GRAM NEGATIVE RODS AEROBIC BOTTLE ONLY CRITICAL RESULT CALLED TO, READ BACK BY AND VERIFIED WITH:  NATHAN BELUE AT 0541 01/27/24 JG Performed at [facility], [address], [place], [place] [address]    Culture PSEUDOMONAS AERUGINOSA (A)  Final   Report Status 01/29/2024 FINAL  Final   Organism ID, Bacteria PSEUDOMONAS AERUGINOSA  Final      Susceptibility   Pseudomonas aeruginosa - MIC*    MEROPENEM INTERMEDIATE Intermediate     CIPROFLOXACIN 0.5 SENSITIVE  Sensitive     IMIPENEM >=16 RESISTANT Resistant     PIP/TAZO Value in next row Sensitive      8 SENSITIVEThis is a modified FDA-approved test that has been validated and its performance characteristics determined by the reporting laboratory.  This laboratory is certified under the Clinical Laboratory Improvement Amendments CLIA as qualified to perform high complexity clinical laboratory testing.    CEFEPIME  Value in next row Sensitive      8 SENSITIVEThis is a modified FDA-approved test that has been validated and its performance characteristics determined by the reporting laboratory.  This laboratory is certified under the Clinical Laboratory Improvement Amendments CLIA as qualified to perform high complexity clinical laboratory testing.    CEFTAZIDIME /AVIBACTAM Value in next row Sensitive      8 SENSITIVEThis is a modified FDA-approved test that has been validated and its performance characteristics determined by the reporting laboratory.  This laboratory is certified under the Clinical Laboratory Improvement Amendments CLIA as qualified to perform high complexity clinical laboratory testing.    CEFTOLOZANE/TAZOBACTAM Value in next row Sensitive      8 SENSITIVEThis is a modified FDA-approved test that has been validated and its performance characteristics determined by the reporting laboratory.  This laboratory is certified under the Clinical Laboratory Improvement Amendments CLIA as qualified to perform high complexity clinical laboratory testing.    TOBRAMYCIN Value in next row Sensitive      8 SENSITIVEThis is a modified FDA-approved test that has been validated and its performance characteristics determined by the reporting laboratory.  This laboratory is certified under the Clinical Laboratory Improvement Amendments CLIA as qualified to perform high complexity clinical laboratory testing.    CEFTAZIDIME  Value in next row Sensitive      8 SENSITIVEThis is a modified FDA-approved test that has  been validated and its performance characteristics determined by the reporting laboratory.  This laboratory is certified under the Clinical Laboratory Improvement Amendments CLIA as qualified to perform high complexity clinical laboratory testing.    * PSEUDOMONAS AERUGINOSA  Blood Culture ID Panel (Reflexed)     Status: Abnormal   Collection Time: 01/26/24  1:01 PM  Result Value Ref Range Status   Enterococcus faecalis NOT DETECTED NOT DETECTED Final   Enterococcus Faecium NOT DETECTED NOT DETECTED Final   Listeria monocytogenes NOT DETECTED NOT DETECTED Final   Staphylococcus species NOT DETECTED NOT DETECTED Final   Staphylococcus aureus (BCID) NOT DETECTED NOT DETECTED Final   Staphylococcus epidermidis NOT DETECTED NOT DETECTED Final   Staphylococcus lugdunensis NOT DETECTED NOT DETECTED Final   Streptococcus species NOT DETECTED NOT DETECTED Final   Streptococcus agalactiae NOT DETECTED NOT DETECTED Final   Streptococcus pneumoniae NOT DETECTED NOT DETECTED Final  Streptococcus pyogenes NOT DETECTED NOT DETECTED Final   A.calcoaceticus-baumannii NOT DETECTED NOT DETECTED Final   Bacteroides fragilis NOT DETECTED NOT DETECTED Final   Enterobacterales NOT DETECTED NOT DETECTED Final   Enterobacter cloacae complex NOT DETECTED NOT DETECTED Final   Escherichia coli NOT DETECTED NOT DETECTED Final   Klebsiella aerogenes NOT DETECTED NOT DETECTED Final   Klebsiella oxytoca NOT DETECTED NOT DETECTED Final   Klebsiella pneumoniae NOT DETECTED NOT DETECTED Final   Proteus species NOT DETECTED NOT DETECTED Final   Salmonella species NOT DETECTED NOT DETECTED Final   Serratia marcescens NOT DETECTED NOT DETECTED Final   Haemophilus influenzae NOT DETECTED NOT DETECTED Final   Neisseria meningitidis NOT DETECTED NOT DETECTED Final   Pseudomonas aeruginosa DETECTED (A) NOT DETECTED Final    Comment: CRITICAL RESULT CALLED TO, READ BACK BY AND VERIFIED WITH:  NATHAN BELUE AT 0541 01/27/24  JG    Stenotrophomonas maltophilia NOT DETECTED NOT DETECTED Final   Candida albicans NOT DETECTED NOT DETECTED Final   Candida auris NOT DETECTED NOT DETECTED Final   Candida glabrata NOT DETECTED NOT DETECTED Final   Candida krusei NOT DETECTED NOT DETECTED Final   Candida parapsilosis NOT DETECTED NOT DETECTED Final   Candida tropicalis NOT DETECTED NOT DETECTED Final   Cryptococcus neoformans/gattii NOT DETECTED NOT DETECTED Final   CTX-M ESBL NOT DETECTED NOT DETECTED Final   Carbapenem resistance IMP NOT DETECTED NOT DETECTED Final   Carbapenem resistance KPC NOT DETECTED NOT DETECTED Final   Carbapenem resistance NDM NOT DETECTED NOT DETECTED Final   Carbapenem resistance VIM NOT DETECTED NOT DETECTED Final    Comment: Performed at [facility], [address] [place], [place] [address]  Culture, blood (Routine X 2) w Reflex to ID Panel     Status: None   Collection Time: 01/29/24  4:38 AM   Specimen: BLOOD  Result Value Ref Range Status   Specimen Description BLOOD BLOOD LEFT ARM  Final   Special Requests   Final    BOTTLES DRAWN AEROBIC AND ANAEROBIC Blood Culture adequate volume   Culture   Final    NO GROWTH 5 DAYS Performed at [facility], [address] [place], [place] [address]    Report Status 02/03/2024 FINAL  Final  Culture, blood (Routine X 2) w Reflex to ID Panel     Status: None   Collection Time: 01/29/24  4:43 AM   Specimen: BLOOD  Result Value Ref Range Status   Specimen Description BLOOD BLOOD LEFT HAND  Final   Special Requests   Final    BOTTLES DRAWN AEROBIC AND ANAEROBIC Blood Culture adequate volume   Culture   Final    NO GROWTH 5 DAYS Performed at [facility], [address] [place], [place] [address]    Report Status 02/03/2024 FINAL  Final    Coagulation Studies: No results for input(s): LABPROT, INR in the last 72 hours.   Urinalysis: No results for input(s): COLORURINE, LABSPEC, PHURINE,  GLUCOSEU, HGBUR, BILIRUBINUR, KETONESUR, PROTEINUR, UROBILINOGEN, NITRITE, LEUKOCYTESUR in the last 72 hours.  Invalid input(s): APPERANCEUR     Imaging: No results found.    Medications:      amiodarone   200 mg Oral Daily   apixaban   2.5 mg Oral BID   budesonide -glycopyrrolate -formoterol   2 puff Inhalation BID   lactose free nutrition  237 mL Oral TID WC   pantoprazole   40 mg Oral Daily   polyethylene glycol  17 g Oral Daily  pravastatin   20 mg Oral QPM   sodium chloride  flush  3 mL Intravenous Q12H   acetaminophen  **OR** acetaminophen , bisacodyl , dextrose , HYDROmorphone  (DILAUDID ) injection, ondansetron  (ZOFRAN ) IV, mouth rinse, senna-docusate, sodium chloride   Assessment/ Plan:  Ms. Kelly Hogan is a 83 y.o.  female with  past medical history including hyperlipidemia, type 2 diabetes, hypertension, obesity, and COPD, who was admitted to [facility] on 01/26/2024 for SVT (supraventricular tachycardia) [I47.10] Cellulitis of left lower extremity [L03.116] Sepsis due to cellulitis (HCC) [L03.90, A41.9] Sepsis, due to unspecified organism, unspecified whether acute organ dysfunction present (HCC) [A41.9]   Acute kidney injury with mild proteinuria on chronic kidney disease stage IV. Baseline creatinine appears to be 1.83 with GFR 27 on December 08, 2023. Suspect there may be a cardiorenal component. BNP elevated. Renal US  unremarkable. Reconsulted due to elevated creatinine. Etiology unknown at this time. No hypotension noted.  Update: Creatinine improved today. Patient on baseline 4L [place]   Lab Results  Component Value Date   CREATININE 3.27 (H) 02/06/2024   CREATININE 3.55 (H) 02/05/2024   CREATININE 3.61 (H) 02/04/2024    Intake/Output Summary (Last 24 hours) at 02/06/2024 1327 Last data filed at 02/06/2024 0900 Gross per 24 hour  Intake 240 ml  Output --  Net 240 ml   2. Anemia of chronic kidney disease Lab Results  Component Value Date   HGB  9.4 (L) 02/06/2024    Hgb acceptable. Will continue to monitor for need of ESA.    3.  Hypertension with chronic kidney disease.  On amlodipine . BP 123/66 today     LOS: 11 Anavi Branscum P Georgiann Neider 11/22/20251:27 PM

## 2024-02-06 NOTE — Progress Notes (Signed)
 BLE dressings changed per order.

## 2024-02-07 DIAGNOSIS — A419 Sepsis, unspecified organism: Secondary | ICD-10-CM | POA: Diagnosis not present

## 2024-02-07 DIAGNOSIS — I5030 Unspecified diastolic (congestive) heart failure: Secondary | ICD-10-CM | POA: Diagnosis not present

## 2024-02-07 DIAGNOSIS — L0390 Cellulitis, unspecified: Secondary | ICD-10-CM | POA: Diagnosis not present

## 2024-02-07 LAB — BASIC METABOLIC PANEL WITH GFR
Anion gap: 9 (ref 5–15)
BUN: 101 mg/dL — ABNORMAL HIGH (ref 8–23)
CO2: 35 mmol/L — ABNORMAL HIGH (ref 22–32)
Calcium: 9.5 mg/dL (ref 8.9–10.3)
Chloride: 88 mmol/L — ABNORMAL LOW (ref 98–111)
Creatinine, Ser: 2.92 mg/dL — ABNORMAL HIGH (ref 0.44–1.00)
GFR, Estimated: 15 mL/min — ABNORMAL LOW (ref >60)
Glucose, Bld: 100 mg/dL — ABNORMAL HIGH (ref 70–99)
Potassium: 4.8 mmol/L (ref 3.5–5.1)
Sodium: 132 mmol/L — ABNORMAL LOW (ref 135–145)

## 2024-02-07 LAB — GLUCOSE, CAPILLARY
Glucose-Capillary: 114 mg/dL — ABNORMAL HIGH (ref 70–99)
Glucose-Capillary: 122 mg/dL — ABNORMAL HIGH (ref 70–99)
Glucose-Capillary: 158 mg/dL — ABNORMAL HIGH (ref 70–99)
Glucose-Capillary: 187 mg/dL — ABNORMAL HIGH (ref 70–99)

## 2024-02-07 NOTE — Plan of Care (Signed)
  Problem: Fluid Volume: Goal: Hemodynamic stability will improve Outcome: Progressing   Problem: Clinical Measurements: Goal: Diagnostic test results will improve Outcome: Progressing Goal: Signs and symptoms of infection will decrease Outcome: Progressing   Problem: Respiratory: Goal: Ability to maintain adequate ventilation will improve Outcome: Progressing   Problem: Education: Goal: Knowledge of General Education information will improve Description: Including pain rating scale, medication(s)/side effects and non-pharmacologic comfort measures Outcome: Progressing   Problem: Health Behavior/Discharge Planning: Goal: Ability to manage health-related needs will improve Outcome: Progressing   Problem: Clinical Measurements: Goal: Ability to maintain clinical measurements within normal limits will improve Outcome: Progressing Goal: Will remain free from infection Outcome: Progressing Goal: Diagnostic test results will improve Outcome: Progressing Goal: Respiratory complications will improve Outcome: Progressing Goal: Cardiovascular complication will be avoided Outcome: Progressing   Problem: Activity: Goal: Risk for activity intolerance will decrease Outcome: Progressing   Problem: Nutrition: Goal: Adequate nutrition will be maintained Outcome: Progressing   Problem: Coping: Goal: Level of anxiety will decrease Outcome: Progressing   Problem: Elimination: Goal: Will not experience complications related to bowel motility Outcome: Progressing Goal: Will not experience complications related to urinary retention Outcome: Progressing   Problem: Pain Managment: Goal: General experience of comfort will improve and/or be controlled Outcome: Progressing   Problem: Safety: Goal: Ability to remain free from injury will improve Outcome: Progressing   Problem: Skin Integrity: Goal: Risk for impaired skin integrity will decrease Outcome: Progressing   Problem:  Clinical Measurements: Goal: Ability to avoid or minimize complications of infection will improve Outcome: Progressing   Problem: Skin Integrity: Goal: Skin integrity will improve Outcome: Progressing

## 2024-02-07 NOTE — Progress Notes (Signed)
 Progress Note   Patient: Kelly Hogan FMW:[id number] DOB: [birth year] DOA: 01/26/2024     12 DOS: the patient was seen and examined on 02/07/2024     Brief hospital course:  From HPI : Kelly Hogan is a 83 y.o. 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   The rest of hospital course as noted below     11/11: admitted to hospitalist w/ hypotension, cellulitis. Metronidazole  and cefepime  in the ED. SVT with rates of 140s, pt requiring amio gtt.   11/12: (+)Pseudomonas bacteremia - linezolid  and vancomycin . Remains on amio gtt for now, cardiology will see in AM. Was off amio briefly this evening d/t limited IV access and needed other Rx, HR was WNL but then up again and back on the drip. Renal fxn worse, resp acidosis and hypercarbia, pt placed on BiPAP. Fluids changed to bicarb infusion 1L. Hyperkalemia, mild, gave K shifting Rx w/ insulin /D50, also admin Lokelma .  11/13: renal fxn continuing to worsen - Nephrology consulted. Pt remains on BiPAP this morning. Was off briefly per her request but O2 down and SOB so placed back on the BiPAP. BP soft, gave another 1L fluids. Cardiology - transition to po amio, starting eliquis . ID recs change ceftazidime  d/t myoclonus effects and confusion. Still worsening into the afternoon, CXR and BNP indicative of HFpEF w/ pulmonary edema despite fairly clear lungs (but exam difficult). Son is ok for trial of diuretics despite renal fxn.  11/14: good UOP and respiratory is improved onto [place] O2, renal fxn about same. Continuing on ceftazidime . Changed morphine  to dilaudid  w/ renal fxn. Renal US  no concerns. Cr 2.86. BCx repeated. Requiring BiPAP at night  11/15: somnolent but alerts to voice. Has not wanted to  sit up in bed/chair. Cr 2.79. UOP yesterday looks like under documented. Continue diuresis. Repeat BCx NG thus far.  11/16: Cr 2.32, up to chair today encouraging.  11/17: Cr 2.15, CO2 levels higher today on BMP, pt refusing BiPAP overnight, encouraged once again she needs to wear at night, needs to be up to chair as much as possible, incentive spirometer. Per ID since she has improved and wbc normalized can complete a total 7 days of IV antibiotic (ceftazidime )  11/18: much more alert today after being on BiPAP last night.    Consultants:  Cardiology  Infectious disease    Procedures/Surgeries: none   ASSESSMENT & PLAN:   Cellulitis of the left lower extremity. Present on admission and ruled out DVT (though US  exam somewhat limited) (+)Pseudomonas bacteremia Dc cefepime  d/t myoclonus / neuro effects.  Completed ceftazidime .  Has completed 7 days course of antibiotics Infectious disease on board   HFpEF Exacerbation d/t SVT + fluid tx per sepsis protocol  Echo noted preserved EF but unable to assess diastolic fxn, likely is dysfunctional. No significant valve dysfunction.  Cardiology is holding off diuresis at this time given worsening renal function Strict I&O Careful monitoring BMP / renal fxn --> improving  Cardiology following    SVT:  Amiodarone  po Eliquis  2.5 mg bid  Cardiologist on board in case discussed Echo as above     Continue amiodarone  taper   Acte on Chronic hypoxic/hypercarbic respiratory failure secondary to COPD and OSA and HFpEF, suspect restrictive disease /  obesity hypoventilation syndrome Resp support was escalated to BIPAP and now weaned down to  but requiring BiPAP at night Pt has been resistant to sitting upright to allow better chest expansion but was able to sit in chair awhile today  Incentive spirometry encouraged    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   Midodrine  discontinued hold antihypertensives. Cardiology following    Type 2 diabetes Monitor glc Relatively low glc, will hold on SSI for now   Hyperlipidemia:  statin   Chronic venous stasis Rec follow w/ lymphedema clinic    Skin nodularity bilateral lower extremities Question hyperkeratotic lesions? Keloid type lesions?  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  Healthy nutrition and physical activity advised as adjunct to other disease management and risk reduction treatments       DVT prophylaxis: lovenox    Code Status: DNR   TOC needs: SNF  Medical barriers to dispo: IV abx and bacteremia.      Subjective Patient admits to improvement in respiratory function Has some bilateral lower extremity pain Denies nausea vomiting chest pain or cough   Family Communication: No family at bedside today   Physical Exam Constitutional: Elderly female sitting up in a chair mental status improved    Rate and Rhythm: Normal rate and regular rhythm.  Pulmonary: Decreased air entry bibasilarly Musculoskeletal:     Comments: Bilateral LE wraps were not disturbed, no proximal pitting edema or erythema   Skin: Neurological:     Mental Status: She is alert and oriented to person, place, and time. Mental status is at baseline.  Psychiatric:        Mood and Affect: Mood  normal.        Behavior: Behavior normal.      Data reviewed:   Vitals:   02/07/24 0349 02/07/24 0715 02/07/24 1212 02/07/24 1427  BP:  130/61 (!) 124/57 (!) 139/58  Pulse:  67 70 70  Resp:  16 16 14   Temp:  97.7 F (36.5 C) 97.7 F (36.5 C) (!) 97.4 F (36.3 C)  TempSrc:  Oral  Oral  SpO2:  100% 100% 100%  Weight: 131.8 kg     Height:          Latest Ref Rng & Units 02/07/2024    4:40 AM 02/06/2024    4:25 AM 02/05/2024    3:52 AM  BMP  Glucose 70 - 99 mg/dL 899  886  886   BUN 8 - 23 mg/dL 898  93  94   Creatinine 0.44 - 1.00 mg/dL 7.07  6.72  6.44   Sodium 135 - 145 mmol/L 132  131  130   Potassium 3.5 - 5.1 mmol/L 4.8  4.9  4.9   Chloride 98 - 111 mmol/L 88  86  84   CO2 22 -  32 mmol/L 35  34  34   Calcium  8.9 - 10.3 mg/dL 9.5  9.4  9.2      Author: Drue ONEIDA Potter, MD 02/07/2024 4:58 PM  For on call review [email or web address].

## 2024-02-07 NOTE — Progress Notes (Signed)
 [facility]  ROUNDING NOTE   Subjective:  Patient denies complaints. On baseline 4L [place] UO Creatinine 2.92  Objective:  Vital signs in last 24 hours:  Temp:  [97.7 F (36.5 C)-98.1 F (36.7 C)] 97.7 F (36.5 C) (11/23 1212) Pulse Rate:  [65-77] 70 (11/23 1212) Resp:  [14-16] 16 (11/23 1212) BP: (124-131)/(51-61) 124/57 (11/23 1212) SpO2:  [94 %-100 %] 100 % (11/23 1212) Weight:  [131.8 kg] 131.8 kg (11/23 0349)  Weight change: 2.3 kg Filed Weights   02/05/24 0435 02/06/24 0500 02/07/24 0349  Weight: 124.4 kg 129.5 kg 131.8 kg    Intake/Output: I/O last 3 completed shifts: In: 0  Out: 600 [Urine:600]   Intake/Output this shift:  Total I/O In: 3 [I.V.:3] Out: 490 [Urine:490]  Physical Exam: General: NAD  Head: Normocephalic  Eyes: Anicteric  Lungs:  Clear to auscultation, 4L [place]  Heart: Regular rate  Abdomen:  Soft, nontender  Extremities:  Legs bilateral wrapped  Neurologic: Awake, alert, conversant  Skin: Warm,dry, no rash  Access: None     Basic Metabolic Panel: Recent Labs  Lab 02/03/24 0415 02/04/24 0319 02/05/24 0352 02/06/24 0425 02/07/24 0440  NA 136 135 130* 131* 132*  K 4.8 4.8 4.9 4.9 4.8  CL 87* 86* 84* 86* 88*  CO2 38* 37* 34* 34* 35*  GLUCOSE 123* 110* 113* 113* 100*  BUN 71* 84* 94* 93* 101*  CREATININE 2.94* 3.61* 3.55* 3.27* 2.92*  CALCIUM  10.1 9.5 9.2 9.4 9.5    Liver Function Tests: No results for input(s): AST, ALT, ALKPHOS, BILITOT, PROT, ALBUMIN  in the last 168 hours.  No results for input(s): LIPASE, AMYLASE in the last 168 hours. No results for input(s): AMMONIA in the last 168 hours.  CBC: Recent Labs  Lab 02/02/24 0353 02/03/24 0415 02/04/24 0319 02/05/24 0352 02/06/24 0425  WBC 7.3 6.7 7.2 6.8 7.3  NEUTROABS  --   --  5.0 4.9 5.2  HGB 9.8* 10.2* 9.6* 9.3* 9.4*  HCT 33.0* 33.7* 31.5* 30.2* 30.9*  MCV 94.3 93.4 91.3 91.0 91.2  PLT 288 300 290 282 281    Cardiac  Enzymes: No results for input(s): CKTOTAL, CKMB, CKMBINDEX, TROPONINI in the last 168 hours.  BNP: Invalid input(s): POCBNP  CBG: Recent Labs  Lab 02/06/24 1144 02/06/24 1811 02/06/24 2008 02/07/24 0715 02/07/24 1242  GLUCAP 140* 113* 169* 114* 122*    Microbiology: Results for orders placed or performed during the hospital encounter of 01/26/24  Resp panel by RT-PCR (RSV, Flu A&B, Covid) Anterior Nasal Swab     Status: None   Collection Time: 01/26/24 12:56 PM   Specimen: Anterior Nasal Swab  Result Value Ref Range Status   SARS Coronavirus 2 by RT PCR NEGATIVE NEGATIVE Final    Comment: (NOTE) SARS-CoV-2 target nucleic acids are NOT DETECTED.  The SARS-CoV-2 RNA is generally detectable in upper respiratory specimens during the acute phase of infection. The lowest concentration of SARS-CoV-2 viral copies this assay can detect is 138 copies/mL. A negative result does not preclude SARS-Cov-2 infection and should not be used as the sole basis for treatment or other patient management decisions. A negative result may occur with  improper specimen collection/handling, submission of specimen other than nasopharyngeal swab, presence of viral mutation(s) within the areas targeted by this assay, and inadequate number of viral copies(<138 copies/mL). A negative result must be combined with clinical observations, patient history, and epidemiological information. The expected result is Negative.  Fact Sheet for Patients:  bloggercourse.com  Fact Sheet for Healthcare Providers:  seriousbroker.it  This test is no t yet approved or cleared by the [place]  FDA and  has been authorized for detection and/or diagnosis of SARS-CoV-2 by FDA under an Emergency Use Authorization (EUA). This EUA will remain  in effect (meaning this test can be used) for the duration of the COVID-19 declaration under Section 564(b)(1) of the  Act, 21 U.S.C.section 360bbb-3(b)(1), unless the authorization is terminated  or revoked sooner.       Influenza A by PCR NEGATIVE NEGATIVE Final   Influenza B by PCR NEGATIVE NEGATIVE Final    Comment: (NOTE) The Xpert Xpress SARS-CoV-2/FLU/RSV plus assay is intended as an aid in the diagnosis of influenza from Nasopharyngeal swab specimens and should not be used as a sole basis for treatment. Nasal washings and aspirates are unacceptable for Xpert Xpress SARS-CoV-2/FLU/RSV testing.  Fact Sheet for Patients: bloggercourse.com  Fact Sheet for Healthcare Providers: seriousbroker.it  This test is not yet approved or cleared by the [place]  FDA and has been authorized for detection and/or diagnosis of SARS-CoV-2 by FDA under an Emergency Use Authorization (EUA). This EUA will remain in effect (meaning this test can be used) for the duration of the COVID-19 declaration under Section 564(b)(1) of the Act, 21 U.S.C. section 360bbb-3(b)(1), unless the authorization is terminated or revoked.     Resp Syncytial Virus by PCR NEGATIVE NEGATIVE Final    Comment: (NOTE) Fact Sheet for Patients: bloggercourse.com  Fact Sheet for Healthcare Providers: seriousbroker.it  This test is not yet approved or cleared by the [place]  FDA and has been authorized for detection and/or diagnosis of SARS-CoV-2 by FDA under an Emergency Use Authorization (EUA). This EUA will remain in effect (meaning this test can be used) for the duration of the COVID-19 declaration under Section 564(b)(1) of the Act, 21 U.S.C. section 360bbb-3(b)(1), unless the authorization is terminated or revoked.  Performed at [facility], [address] [place], [place] [address]   Blood Culture (routine x 2)     Status: Abnormal   Collection Time: 01/26/24 12:56 PM   Specimen: BLOOD  Result Value Ref  Range Status   Specimen Description   Final    BLOOD BLOOD RIGHT FOREARM Performed at [facility], [address] [place], [place] [address]    Special Requests   Final    BOTTLES DRAWN AEROBIC AND ANAEROBIC Blood Culture results may not be optimal due to an inadequate volume of blood received in culture bottles Performed at [facility], [address], [place], [place] [address]    Culture  Setup Time   Final    GRAM NEGATIVE RODS AEROBIC BOTTLE ONLY CRITICAL VALUE NOTED.  VALUE IS CONSISTENT WITH PREVIOUSLY REPORTED AND CALLED VALUE. Performed at [facility], [address] [place], [place] [address]    Culture (A)  Final    PSEUDOMONAS AERUGINOSA SUSCEPTIBILITIES PERFORMED ON PREVIOUS CULTURE WITHIN THE LAST 5 DAYS. Performed at [facility], [address] [place], [place] [address]    Report Status 01/29/2024 FINAL  Final  Blood Culture (routine x 2)     Status: Abnormal   Collection Time: 01/26/24  1:01 PM   Specimen: BLOOD  Result Value Ref Range Status   Specimen Description   Final    BLOOD RIGHT ARM Performed at [facility], [address] [place], [place] [address]    Special Requests   Final    BOTTLES  DRAWN AEROBIC AND ANAEROBIC Blood Culture adequate volume Performed at [facility], [address] [place], [place] [address]    Culture  Setup Time   Final    GRAM NEGATIVE RODS AEROBIC BOTTLE ONLY CRITICAL RESULT CALLED TO, READ BACK BY AND VERIFIED WITH:  NATHAN BELUE AT 0541 01/27/24 JG Performed at [facility], [address] [place], [place] [address]    Culture PSEUDOMONAS AERUGINOSA (A)  Final   Report Status 01/29/2024 FINAL  Final   Organism ID, Bacteria PSEUDOMONAS AERUGINOSA  Final      Susceptibility   Pseudomonas aeruginosa - MIC*    MEROPENEM INTERMEDIATE Intermediate     CIPROFLOXACIN 0.5 SENSITIVE Sensitive     IMIPENEM >=16 RESISTANT Resistant     PIP/TAZO Value in  next row Sensitive      8 SENSITIVEThis is a modified FDA-approved test that has been validated and its performance characteristics determined by the reporting laboratory.  This laboratory is certified under the Clinical Laboratory Improvement Amendments CLIA as qualified to perform high complexity clinical laboratory testing.    CEFEPIME  Value in next row Sensitive      8 SENSITIVEThis is a modified FDA-approved test that has been validated and its performance characteristics determined by the reporting laboratory.  This laboratory is certified under the Clinical Laboratory Improvement Amendments CLIA as qualified to perform high complexity clinical laboratory testing.    CEFTAZIDIME /AVIBACTAM Value in next row Sensitive      8 SENSITIVEThis is a modified FDA-approved test that has been validated and its performance characteristics determined by the reporting laboratory.  This laboratory is certified under the Clinical Laboratory Improvement Amendments CLIA as qualified to perform high complexity clinical laboratory testing.    CEFTOLOZANE/TAZOBACTAM Value in next row Sensitive      8 SENSITIVEThis is a modified FDA-approved test that has been validated and its performance characteristics determined by the reporting laboratory.  This laboratory is certified under the Clinical Laboratory Improvement Amendments CLIA as qualified to perform high complexity clinical laboratory testing.    TOBRAMYCIN Value in next row Sensitive      8 SENSITIVEThis is a modified FDA-approved test that has been validated and its performance characteristics determined by the reporting laboratory.  This laboratory is certified under the Clinical Laboratory Improvement Amendments CLIA as qualified to perform high complexity clinical laboratory testing.    CEFTAZIDIME  Value in next row Sensitive      8 SENSITIVEThis is a modified FDA-approved test that has been validated and its performance characteristics determined by the  reporting laboratory.  This laboratory is certified under the Clinical Laboratory Improvement Amendments CLIA as qualified to perform high complexity clinical laboratory testing.    * PSEUDOMONAS AERUGINOSA  Blood Culture ID Panel (Reflexed)     Status: Abnormal   Collection Time: 01/26/24  1:01 PM  Result Value Ref Range Status   Enterococcus faecalis NOT DETECTED NOT DETECTED Final   Enterococcus Faecium NOT DETECTED NOT DETECTED Final   Listeria monocytogenes NOT DETECTED NOT DETECTED Final   Staphylococcus species NOT DETECTED NOT DETECTED Final   Staphylococcus aureus (BCID) NOT DETECTED NOT DETECTED Final   Staphylococcus epidermidis NOT DETECTED NOT DETECTED Final   Staphylococcus lugdunensis NOT DETECTED NOT DETECTED Final   Streptococcus species NOT DETECTED NOT DETECTED Final   Streptococcus agalactiae NOT DETECTED NOT DETECTED Final   Streptococcus pneumoniae NOT DETECTED NOT DETECTED Final   Streptococcus pyogenes NOT DETECTED NOT DETECTED Final   A.calcoaceticus-baumannii NOT DETECTED NOT  DETECTED Final   Bacteroides fragilis NOT DETECTED NOT DETECTED Final   Enterobacterales NOT DETECTED NOT DETECTED Final   Enterobacter cloacae complex NOT DETECTED NOT DETECTED Final   Escherichia coli NOT DETECTED NOT DETECTED Final   Klebsiella aerogenes NOT DETECTED NOT DETECTED Final   Klebsiella oxytoca NOT DETECTED NOT DETECTED Final   Klebsiella pneumoniae NOT DETECTED NOT DETECTED Final   Proteus species NOT DETECTED NOT DETECTED Final   Salmonella species NOT DETECTED NOT DETECTED Final   Serratia marcescens NOT DETECTED NOT DETECTED Final   Haemophilus influenzae NOT DETECTED NOT DETECTED Final   Neisseria meningitidis NOT DETECTED NOT DETECTED Final   Pseudomonas aeruginosa DETECTED (A) NOT DETECTED Final    Comment: CRITICAL RESULT CALLED TO, READ BACK BY AND VERIFIED WITH:  NATHAN BELUE AT 0541 01/27/24 JG    Stenotrophomonas maltophilia NOT DETECTED NOT DETECTED Final    Candida albicans NOT DETECTED NOT DETECTED Final   Candida auris NOT DETECTED NOT DETECTED Final   Candida glabrata NOT DETECTED NOT DETECTED Final   Candida krusei NOT DETECTED NOT DETECTED Final   Candida parapsilosis NOT DETECTED NOT DETECTED Final   Candida tropicalis NOT DETECTED NOT DETECTED Final   Cryptococcus neoformans/gattii NOT DETECTED NOT DETECTED Final   CTX-M ESBL NOT DETECTED NOT DETECTED Final   Carbapenem resistance IMP NOT DETECTED NOT DETECTED Final   Carbapenem resistance KPC NOT DETECTED NOT DETECTED Final   Carbapenem resistance NDM NOT DETECTED NOT DETECTED Final   Carbapenem resistance VIM NOT DETECTED NOT DETECTED Final    Comment: Performed at [facility], [address] [place], [place] [address]  Culture, blood (Routine X 2) w Reflex to ID Panel     Status: None   Collection Time: 01/29/24  4:38 AM   Specimen: BLOOD  Result Value Ref Range Status   Specimen Description BLOOD BLOOD LEFT ARM  Final   Special Requests   Final    BOTTLES DRAWN AEROBIC AND ANAEROBIC Blood Culture adequate volume   Culture   Final    NO GROWTH 5 DAYS Performed at [facility], [address] [place], [place] [address]    Report Status 02/03/2024 FINAL  Final  Culture, blood (Routine X 2) w Reflex to ID Panel     Status: None   Collection Time: 01/29/24  4:43 AM   Specimen: BLOOD  Result Value Ref Range Status   Specimen Description BLOOD BLOOD LEFT HAND  Final   Special Requests   Final    BOTTLES DRAWN AEROBIC AND ANAEROBIC Blood Culture adequate volume   Culture   Final    NO GROWTH 5 DAYS Performed at [facility], [address] [place], [place] [address]    Report Status 02/03/2024 FINAL  Final    Coagulation Studies: No results for input(s): LABPROT, INR in the last 72 hours.   Urinalysis: No results for input(s): COLORURINE, LABSPEC, PHURINE, GLUCOSEU, HGBUR, BILIRUBINUR, KETONESUR, PROTEINUR,  UROBILINOGEN, NITRITE, LEUKOCYTESUR in the last 72 hours.  Invalid input(s): APPERANCEUR     Imaging: No results found.    Medications:      amiodarone   200 mg Oral Daily   apixaban   2.5 mg Oral BID   budesonide -glycopyrrolate -formoterol   2 puff Inhalation BID   lactose free nutrition  237 mL Oral TID WC   pantoprazole   40 mg Oral Daily   polyethylene glycol  17 g Oral Daily   pravastatin   20 mg Oral QPM   sodium chloride  flush  3 mL Intravenous Q12H   acetaminophen  **OR** acetaminophen , bisacodyl , dextrose , HYDROmorphone  (DILAUDID ) injection, ondansetron  (ZOFRAN ) IV, mouth rinse, senna-docusate, sodium chloride   Assessment/ Plan:  Ms. KEAUNNA SKIPPER is a 83 y.o.  female with  past medical history including hyperlipidemia, type 2 diabetes, hypertension, obesity, and COPD, who was admitted to [facility] on 01/26/2024 for SVT (supraventricular tachycardia) [I47.10] Cellulitis of left lower extremity [L03.116] Sepsis due to cellulitis (HCC) [L03.90, A41.9] Sepsis, due to unspecified organism, unspecified whether acute organ dysfunction present (HCC) [A41.9]   Acute kidney injury with mild proteinuria on chronic kidney disease stage IV. Baseline creatinine appears to be 1.83 with GFR 27 on December 08, 2023. Suspect there may be a cardiorenal component. BNP elevated. Renal US  unremarkable. Reconsulted due to elevated creatinine. Etiology unknown at this time. Update: Creatinine continues to improve. Patient on baseline 4L [place] Will continue monitoring.   Lab Results  Component Value Date   CREATININE 2.92 (H) 02/07/2024   CREATININE 3.27 (H) 02/06/2024   CREATININE 3.55 (H) 02/05/2024    Intake/Output Summary (Last 24 hours) at 02/07/2024 1318 Last data filed at 02/07/2024 1020 Gross per 24 hour  Intake 3 ml  Output 1090 ml  Net -1087 ml   2. Anemia of chronic kidney disease Lab Results  Component Value Date   HGB 9.4 (L) 02/06/2024    Hgb acceptable. Will  continue to monitor for need of ESA.    3.  Hypertension with chronic kidney disease.  On amlodipine . BP 124/57 today     LOS: 12 Janecia [place] 11/23/20251:18 PM

## 2024-02-07 NOTE — Progress Notes (Signed)
 SUBJECTIVE: Patient is breathing much better denies any chest pain or shortness of breath at this time.  Sitting up eating breakfast.   Vitals:   02/07/24 0345 02/07/24 0349 02/07/24 0715 02/07/24 1212  BP: (!) 128/57  130/61 (!) 124/57  Pulse:   67 70  Resp:   16 16  Temp: 98 F (36.7 C)  97.7 F (36.5 C) 97.7 F (36.5 C)  TempSrc: Oral  Oral   SpO2:   100% 100%  Weight:  131.8 kg    Height:        Intake/Output Summary (Last 24 hours) at 02/07/2024 1240 Last data filed at 02/07/2024 1020 Gross per 24 hour  Intake 3 ml  Output 1090 ml  Net -1087 ml    LABS: Basic Metabolic Panel: Recent Labs    02/06/24 0425 02/07/24 0440  NA 131* 132*  K 4.9 4.8  CL 86* 88*  CO2 34* 35*  GLUCOSE 113* 100*  BUN 93* 101*  CREATININE 3.27* 2.92*  CALCIUM  9.4 9.5   Liver Function Tests: No results for input(s): AST, ALT, ALKPHOS, BILITOT, PROT, ALBUMIN  in the last 72 hours. No results for input(s): LIPASE, AMYLASE in the last 72 hours. CBC: Recent Labs    02/05/24 0352 02/06/24 0425  WBC 6.8 7.3  NEUTROABS 4.9 5.2  HGB 9.3* 9.4*  HCT 30.2* 30.9*  MCV 91.0 91.2  PLT 282 281   Cardiac Enzymes: No results for input(s): CKTOTAL, CKMB, CKMBINDEX, TROPONINI in the last 72 hours. BNP: Invalid input(s): POCBNP D-Dimer: No results for input(s): DDIMER in the last 72 hours. Hemoglobin A1C: No results for input(s): HGBA1C in the last 72 hours. Fasting Lipid Panel: No results for input(s): CHOL, HDL, LDLCALC, TRIG, CHOLHDL, LDLDIRECT in the last 72 hours. Thyroid Function Tests: No results for input(s): TSH, T4TOTAL, T3FREE, THYROIDAB in the last 72 hours.  Invalid input(s): FREET3 Anemia Panel: No results for input(s): VITAMINB12, FOLATE, FERRITIN, TIBC, IRON, RETICCTPCT in the last 72 hours.   PHYSICAL EXAM General: Well developed, well nourished, in no acute distress HEENT:  Normocephalic and  atramatic Neck:  No JVD.  Lungs: Clear bilaterally to auscultation and percussion. Heart: HRRR . Normal S1 and S2 without gallops or murmurs.  Abdomen: Bowel sounds are positive, abdomen soft and non-tender  Msk:  Back normal, normal gait. Normal strength and tone for age. Extremities: No clubbing, cyanosis or edema.   Neuro: Alert and oriented X 3. Psych:  Good affect, responds appropriately  TELEMETRY: Sinus rhythm  ASSESSMENT AND PLAN: 83 y.o. female  with a past medical history of COPD, chronic respiratory failure on 4 L, hypertension, hyperlipidemia, type 2 diabetes, obesity, obesity hypoventilation syndrome, history of venous insufficiency who presented to the ED on 01/26/2024 for concern for leg infection.  Noted to be in atrial fibrillation RVR initially, placed on IV amiodarone  and ultimately converted to normal sinus rhythm.  Blood cultures were positive for Pseudomonas.  Cardiology was consulted for further evaluation.    # Acute on chronic hypoxic respiratory failure # Atrial fibrillation RVR # New onset atrial fibrillation # Cellulitis # Pseudomonas bacteremia # Hypertension Patient sent to the ED for evaluation of cellulitis, ultimately developed atrial fibrillation RVR which was treated with IV amiodarone  and she converted to normal sinus rhythm yesterday.  Found to have positive blood cultures growing Pseudomonas.  Last week developed worsening hypoxia and was placed on BiPAP, now on nasal cannula.  Echo this admission with EF 60-65%, no WMA's, mild MR. - Continue  holding lasix  due to worsening renal function. - Continue p.o. amiodarone  load with 400 mg twice daily for 7 days followed by 200 mg daily. - Continue Eliquis  2.5 mg twice daily (dose appropriate given age, renal function). - Continue pravastatin  20 mg daily. - Reviewed ID notes, no mention of TEE and TTE from this admission did not show any significant valvular issues.   ICD-10-CM   1. Sepsis, due to unspecified  organism, unspecified whether acute organ dysfunction present (HCC)  A41.9     2. SVT (supraventricular tachycardia)  I47.10     3. Cellulitis of left lower extremity  L03.116       Principal Problem:   Sepsis due to cellulitis [facility]) Active Problems:   Essential (primary) hypertension   Type 2 diabetes mellitus (HCC)   Chronic venous insufficiency   HLD (hyperlipidemia)   Morbid obesity with BMI of 50.0-59.9, adult (HCC)   OSA (obstructive sleep apnea)   Obesity hypoventilation syndrome (HCC)   (HFpEF) heart failure with preserved ejection fraction (HCC)   Bacteremia due to Pseudomonas   CO2 narcosis   Metabolic encephalopathy   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nostras verrucosa   Cellulitis of left lower extremity    Denyse Bathe, MD, FACC 02/07/2024 12:40 PM

## 2024-02-08 DIAGNOSIS — L0390 Cellulitis, unspecified: Secondary | ICD-10-CM | POA: Diagnosis not present

## 2024-02-08 DIAGNOSIS — A419 Sepsis, unspecified organism: Secondary | ICD-10-CM | POA: Diagnosis not present

## 2024-02-08 LAB — GLUCOSE, CAPILLARY
Glucose-Capillary: 103 mg/dL — ABNORMAL HIGH (ref 70–99)
Glucose-Capillary: 155 mg/dL — ABNORMAL HIGH (ref 70–99)

## 2024-02-08 LAB — BASIC METABOLIC PANEL WITH GFR
Anion gap: 8 (ref 5–15)
BUN: 101 mg/dL — ABNORMAL HIGH (ref 8–23)
CO2: 35 mmol/L — ABNORMAL HIGH (ref 22–32)
Calcium: 9.4 mg/dL (ref 8.9–10.3)
Chloride: 89 mmol/L — ABNORMAL LOW (ref 98–111)
Creatinine, Ser: 2.73 mg/dL — ABNORMAL HIGH (ref 0.44–1.00)
GFR, Estimated: 17 mL/min — ABNORMAL LOW (ref >60)
Glucose, Bld: 103 mg/dL — ABNORMAL HIGH (ref 70–99)
Potassium: 5.2 mmol/L — ABNORMAL HIGH (ref 3.5–5.1)
Sodium: 132 mmol/L — ABNORMAL LOW (ref 135–145)

## 2024-02-08 MED ORDER — SODIUM ZIRCONIUM CYCLOSILICATE 10 G PO PACK
10.0000 g | PACK | Freq: Once | ORAL | Status: AC
  Administered 2024-02-08: 10 g via ORAL
  Filled 2024-02-08: qty 1

## 2024-02-08 MED ORDER — APIXABAN 2.5 MG PO TABS: 2.5000 mg | ORAL_TABLET | Freq: Two times a day (BID) | ORAL | Status: AC

## 2024-02-08 MED ORDER — AMIODARONE HCL 200 MG PO TABS: 200.0000 mg | ORAL_TABLET | Freq: Every day | ORAL | Status: AC

## 2024-02-08 NOTE — Progress Notes (Signed)
 [facility]  ROUNDING NOTE   Subjective:   Patient seen laying in bed Alert and oriented No family present Denies shortness of breath Tolerating small meals but appetite not at baseline  Creatinine 2.73  Objective:  Vital signs in last 24 hours:  Temp:  [97.4 F (36.3 C)-98 F (36.7 C)] 98 F (36.7 C) (11/24 0751) Pulse Rate:  [67-78] 69 (11/24 0751) Resp:  [12-24] 16 (11/24 0751) BP: (115-139)/(53-86) 121/55 (11/24 0751) SpO2:  [95 %-100 %] 100 % (11/24 0751) Weight:  [131.1 kg] 131.1 kg (11/24 0432)  Weight change: -0.7 kg Filed Weights   02/06/24 0500 02/07/24 0349 02/08/24 0432  Weight: 129.5 kg 131.8 kg 131.1 kg    Intake/Output: I/O last 3 completed shifts: In: 56 [P.O.:50; I.V.:6] Out: 1840 [Urine:1840]   Intake/Output this shift:  Total I/O In: 3 [I.V.:3] Out: 230 [Urine:230]  Physical Exam: General: NAD  Head: Normocephalic  Eyes: Anicteric  Lungs:  Clear to auscultation, 4L [place]  Heart: Regular rate  Abdomen:  Soft, nontender  Extremities:  Legs bilateral wrapped  Neurologic: Awake, alert, conversant  Skin: Warm,dry, no rash  Access: None     Basic Metabolic Panel: Recent Labs  Lab 02/04/24 0319 02/05/24 0352 02/06/24 0425 02/07/24 0440 02/08/24 0311  NA 135 130* 131* 132* 132*  K 4.8 4.9 4.9 4.8 5.2*  CL 86* 84* 86* 88* 89*  CO2 37* 34* 34* 35* 35*  GLUCOSE 110* 113* 113* 100* 103*  BUN 84* 94* 93* 101* 101*  CREATININE 3.61* 3.55* 3.27* 2.92* 2.73*  CALCIUM  9.5 9.2 9.4 9.5 9.4    Liver Function Tests: No results for input(s): AST, ALT, ALKPHOS, BILITOT, PROT, ALBUMIN  in the last 168 hours.  No results for input(s): LIPASE, AMYLASE in the last 168 hours. No results for input(s): AMMONIA in the last 168 hours.  CBC: Recent Labs  Lab 02/02/24 0353 02/03/24 0415 02/04/24 0319 02/05/24 0352 02/06/24 0425  WBC 7.3 6.7 7.2 6.8 7.3  NEUTROABS  --   --  5.0 4.9 5.2  HGB 9.8* 10.2* 9.6* 9.3* 9.4*   HCT 33.0* 33.7* 31.5* 30.2* 30.9*  MCV 94.3 93.4 91.3 91.0 91.2  PLT 288 300 290 282 281    Cardiac Enzymes: No results for input(s): CKTOTAL, CKMB, CKMBINDEX, TROPONINI in the last 168 hours.  BNP: Invalid input(s): POCBNP  CBG: Recent Labs  Lab 02/07/24 0715 02/07/24 1242 02/07/24 1625 02/07/24 2013 02/08/24 0828  GLUCAP 114* 122* 158* 187* 103*    Microbiology: Results for orders placed or performed during the hospital encounter of 01/26/24  Resp panel by RT-PCR (RSV, Flu A&B, Covid) Anterior Nasal Swab     Status: None   Collection Time: 01/26/24 12:56 PM   Specimen: Anterior Nasal Swab  Result Value Ref Range Status   SARS Coronavirus 2 by RT PCR NEGATIVE NEGATIVE Final    Comment: (NOTE) SARS-CoV-2 target nucleic acids are NOT DETECTED.  The SARS-CoV-2 RNA is generally detectable in upper respiratory specimens during the acute phase of infection. The lowest concentration of SARS-CoV-2 viral copies this assay can detect is 138 copies/mL. A negative result does not preclude SARS-Cov-2 infection and should not be used as the sole basis for treatment or other patient management decisions. A negative result may occur with  improper specimen collection/handling, submission of specimen other than nasopharyngeal swab, presence of viral mutation(s) within the areas targeted by this assay, and inadequate number of viral copies(<138 copies/mL). A negative result must be combined with clinical  observations, patient history, and epidemiological information. The expected result is Negative.  Fact Sheet for Patients:  bloggercourse.com  Fact Sheet for Healthcare Providers:  seriousbroker.it  This test is no t yet approved or cleared by the [place]  FDA and  has been authorized for detection and/or diagnosis of SARS-CoV-2 by FDA under an Emergency Use Authorization (EUA). This EUA will remain  in effect  (meaning this test can be used) for the duration of the COVID-19 declaration under Section 564(b)(1) of the Act, 21 U.S.C.section 360bbb-3(b)(1), unless the authorization is terminated  or revoked sooner.       Influenza A by PCR NEGATIVE NEGATIVE Final   Influenza B by PCR NEGATIVE NEGATIVE Final    Comment: (NOTE) The Xpert Xpress SARS-CoV-2/FLU/RSV plus assay is intended as an aid in the diagnosis of influenza from Nasopharyngeal swab specimens and should not be used as a sole basis for treatment. Nasal washings and aspirates are unacceptable for Xpert Xpress SARS-CoV-2/FLU/RSV testing.  Fact Sheet for Patients: bloggercourse.com  Fact Sheet for Healthcare Providers: seriousbroker.it  This test is not yet approved or cleared by the [place]  FDA and has been authorized for detection and/or diagnosis of SARS-CoV-2 by FDA under an Emergency Use Authorization (EUA). This EUA will remain in effect (meaning this test can be used) for the duration of the COVID-19 declaration under Section 564(b)(1) of the Act, 21 U.S.C. section 360bbb-3(b)(1), unless the authorization is terminated or revoked.     Resp Syncytial Virus by PCR NEGATIVE NEGATIVE Final    Comment: (NOTE) Fact Sheet for Patients: bloggercourse.com  Fact Sheet for Healthcare Providers: seriousbroker.it  This test is not yet approved or cleared by the [place]  FDA and has been authorized for detection and/or diagnosis of SARS-CoV-2 by FDA under an Emergency Use Authorization (EUA). This EUA will remain in effect (meaning this test can be used) for the duration of the COVID-19 declaration under Section 564(b)(1) of the Act, 21 U.S.C. section 360bbb-3(b)(1), unless the authorization is terminated or revoked.  Performed at [facility], [address], [place], [place] [address]   Blood Culture  (routine x 2)     Status: Abnormal   Collection Time: 01/26/24 12:56 PM   Specimen: BLOOD  Result Value Ref Range Status   Specimen Description   Final    BLOOD BLOOD RIGHT FOREARM Performed at [facility], [address], [place], [place] [address]    Special Requests   Final    BOTTLES DRAWN AEROBIC AND ANAEROBIC Blood Culture results may not be optimal due to an inadequate volume of blood received in culture bottles Performed at [facility], [address] [place], [place] [address]    Culture  Setup Time   Final    GRAM NEGATIVE RODS AEROBIC BOTTLE ONLY CRITICAL VALUE NOTED.  VALUE IS CONSISTENT WITH PREVIOUSLY REPORTED AND CALLED VALUE. Performed at [facility], [address] [place], [place] [address]    Culture (A)  Final    PSEUDOMONAS AERUGINOSA SUSCEPTIBILITIES PERFORMED ON PREVIOUS CULTURE WITHIN THE LAST 5 DAYS. Performed at [facility], [address], [place], [place] [address]    Report Status 01/29/2024 FINAL  Final  Blood Culture (routine x 2)     Status: Abnormal   Collection Time: 01/26/24  1:01 PM   Specimen: BLOOD  Result Value Ref Range Status   Specimen Description   Final    BLOOD RIGHT ARM Performed at [facility], [address] [place], [place] [address]    Special Requests   Final    BOTTLES DRAWN AEROBIC AND ANAEROBIC Blood Culture adequate volume Performed at [facility], [address] [place], [place] [address]    Culture  Setup Time   Final    GRAM NEGATIVE RODS AEROBIC BOTTLE ONLY CRITICAL RESULT CALLED TO, READ BACK BY AND VERIFIED WITH:  NATHAN BELUE AT 0541 01/27/24 JG Performed at [facility], [address], [place], [place] [address]    Culture PSEUDOMONAS AERUGINOSA (A)  Final   Report Status 01/29/2024 FINAL  Final   Organism ID, Bacteria PSEUDOMONAS AERUGINOSA  Final      Susceptibility   Pseudomonas aeruginosa - MIC*    MEROPENEM INTERMEDIATE  Intermediate     CIPROFLOXACIN 0.5 SENSITIVE Sensitive     IMIPENEM >=16 RESISTANT Resistant     PIP/TAZO Value in next row Sensitive      8 SENSITIVEThis is a modified FDA-approved test that has been validated and its performance characteristics determined by the reporting laboratory.  This laboratory is certified under the Clinical Laboratory Improvement Amendments CLIA as qualified to perform high complexity clinical laboratory testing.    CEFEPIME  Value in next row Sensitive      8 SENSITIVEThis is a modified FDA-approved test that has been validated and its performance characteristics determined by the reporting laboratory.  This laboratory is certified under the Clinical Laboratory Improvement Amendments CLIA as qualified to perform high complexity clinical laboratory testing.    CEFTAZIDIME /AVIBACTAM Value in next row Sensitive      8 SENSITIVEThis is a modified FDA-approved test that has been validated and its performance characteristics determined by the reporting laboratory.  This laboratory is certified under the Clinical Laboratory Improvement Amendments CLIA as qualified to perform high complexity clinical laboratory testing.    CEFTOLOZANE/TAZOBACTAM Value in next row Sensitive      8 SENSITIVEThis is a modified FDA-approved test that has been validated and its performance characteristics determined by the reporting laboratory.  This laboratory is certified under the Clinical Laboratory Improvement Amendments CLIA as qualified to perform high complexity clinical laboratory testing.    TOBRAMYCIN Value in next row Sensitive      8 SENSITIVEThis is a modified FDA-approved test that has been validated and its performance characteristics determined by the reporting laboratory.  This laboratory is certified under the Clinical Laboratory Improvement Amendments CLIA as qualified to perform high complexity clinical laboratory testing.    CEFTAZIDIME  Value in next row Sensitive      8  SENSITIVEThis is a modified FDA-approved test that has been validated and its performance characteristics determined by the reporting laboratory.  This laboratory is certified under the Clinical Laboratory Improvement Amendments CLIA as qualified to perform high complexity clinical laboratory testing.    * PSEUDOMONAS AERUGINOSA  Blood Culture ID Panel (Reflexed)     Status: Abnormal   Collection Time: 01/26/24  1:01 PM  Result Value Ref Range Status   Enterococcus faecalis NOT DETECTED NOT DETECTED Final   Enterococcus Faecium NOT DETECTED NOT DETECTED Final   Listeria monocytogenes NOT DETECTED NOT DETECTED Final   Staphylococcus species NOT DETECTED NOT DETECTED Final   Staphylococcus aureus (BCID) NOT DETECTED NOT DETECTED Final   Staphylococcus epidermidis NOT DETECTED NOT DETECTED Final   Staphylococcus lugdunensis NOT DETECTED NOT DETECTED Final   Streptococcus species NOT DETECTED NOT DETECTED Final   Streptococcus agalactiae NOT DETECTED NOT DETECTED Final   Streptococcus pneumoniae NOT DETECTED NOT  DETECTED Final   Streptococcus pyogenes NOT DETECTED NOT DETECTED Final   A.calcoaceticus-baumannii NOT DETECTED NOT DETECTED Final   Bacteroides fragilis NOT DETECTED NOT DETECTED Final   Enterobacterales NOT DETECTED NOT DETECTED Final   Enterobacter cloacae complex NOT DETECTED NOT DETECTED Final   Escherichia coli NOT DETECTED NOT DETECTED Final   Klebsiella aerogenes NOT DETECTED NOT DETECTED Final   Klebsiella oxytoca NOT DETECTED NOT DETECTED Final   Klebsiella pneumoniae NOT DETECTED NOT DETECTED Final   Proteus species NOT DETECTED NOT DETECTED Final   Salmonella species NOT DETECTED NOT DETECTED Final   Serratia marcescens NOT DETECTED NOT DETECTED Final   Haemophilus influenzae NOT DETECTED NOT DETECTED Final   Neisseria meningitidis NOT DETECTED NOT DETECTED Final   Pseudomonas aeruginosa DETECTED (A) NOT DETECTED Final    Comment: CRITICAL RESULT CALLED TO, READ BACK BY  AND VERIFIED WITH:  NATHAN BELUE AT 0541 01/27/24 JG    Stenotrophomonas maltophilia NOT DETECTED NOT DETECTED Final   Candida albicans NOT DETECTED NOT DETECTED Final   Candida auris NOT DETECTED NOT DETECTED Final   Candida glabrata NOT DETECTED NOT DETECTED Final   Candida krusei NOT DETECTED NOT DETECTED Final   Candida parapsilosis NOT DETECTED NOT DETECTED Final   Candida tropicalis NOT DETECTED NOT DETECTED Final   Cryptococcus neoformans/gattii NOT DETECTED NOT DETECTED Final   CTX-M ESBL NOT DETECTED NOT DETECTED Final   Carbapenem resistance IMP NOT DETECTED NOT DETECTED Final   Carbapenem resistance KPC NOT DETECTED NOT DETECTED Final   Carbapenem resistance NDM NOT DETECTED NOT DETECTED Final   Carbapenem resistance VIM NOT DETECTED NOT DETECTED Final    Comment: Performed at [facility], [address] [place], [place] [address]  Culture, blood (Routine X 2) w Reflex to ID Panel     Status: None   Collection Time: 01/29/24  4:38 AM   Specimen: BLOOD  Result Value Ref Range Status   Specimen Description BLOOD BLOOD LEFT ARM  Final   Special Requests   Final    BOTTLES DRAWN AEROBIC AND ANAEROBIC Blood Culture adequate volume   Culture   Final    NO GROWTH 5 DAYS Performed at [facility], [address] [place], [place] [address]    Report Status 02/03/2024 FINAL  Final  Culture, blood (Routine X 2) w Reflex to ID Panel     Status: None   Collection Time: 01/29/24  4:43 AM   Specimen: BLOOD  Result Value Ref Range Status   Specimen Description BLOOD BLOOD LEFT HAND  Final   Special Requests   Final    BOTTLES DRAWN AEROBIC AND ANAEROBIC Blood Culture adequate volume   Culture   Final    NO GROWTH 5 DAYS Performed at [facility], [address] [place], [place] [address]    Report Status 02/03/2024 FINAL  Final    Coagulation Studies: No results for input(s): LABPROT, INR in the last 72 hours.   Urinalysis: No results  for input(s): COLORURINE, LABSPEC, PHURINE, GLUCOSEU, HGBUR, BILIRUBINUR, KETONESUR, PROTEINUR, UROBILINOGEN, NITRITE, LEUKOCYTESUR in the last 72 hours.  Invalid input(s): APPERANCEUR     Imaging: No results found.    Medications:      amiodarone   200 mg Oral Daily   apixaban   2.5 mg Oral BID   budesonide -glycopyrrolate -formoterol   2 puff Inhalation BID   lactose free nutrition  237 mL Oral TID WC   pantoprazole   40 mg Oral Daily   polyethylene glycol  17  g Oral Daily   pravastatin   20 mg Oral QPM   sodium chloride  flush  3 mL Intravenous Q12H   sodium zirconium cyclosilicate   10 g Oral Once   acetaminophen  **OR** acetaminophen , bisacodyl , dextrose , HYDROmorphone  (DILAUDID ) injection, ondansetron  (ZOFRAN ) IV, mouth rinse, senna-docusate, sodium chloride   Assessment/ Plan:  Ms. Kelly Hogan is a 83 y.o.  female with  past medical history including hyperlipidemia, type 2 diabetes, hypertension, obesity, and COPD, who was admitted to [facility] on 01/26/2024 for SVT (supraventricular tachycardia) [I47.10] Cellulitis of left lower extremity [L03.116] Sepsis due to cellulitis (HCC) [L03.90, A41.9] Sepsis, due to unspecified organism, unspecified whether acute organ dysfunction present (HCC) [A41.9]   Acute kidney injury with mild proteinuria on chronic kidney disease stage IV. Baseline creatinine appears to be 1.83 with GFR 27 on December 08, 2023. Suspect there may be a cardiorenal component. BNP elevated. Renal US  unremarkable. Reconsulted due to elevated creatinine. Etiology unknown at this time. Update: Creatinine remains elevated but stable. May represent new baseline. Will need to follow up with our office.    Lab Results  Component Value Date   CREATININE 2.73 (H) 02/08/2024   CREATININE 2.92 (H) 02/07/2024   CREATININE 3.27 (H) 02/06/2024    Intake/Output Summary (Last 24 hours) at 02/08/2024 1126 Last data filed at 02/08/2024 1023 Gross per  24 hour  Intake 56 ml  Output 980 ml  Net -924 ml   2. Anemia of chronic kidney disease Lab Results  Component Value Date   HGB 9.4 (L) 02/06/2024    Hgb within desired target. Will continue to monitor for need of ESA.    3.  Hypertension with chronic kidney disease.  On amlodipine . Blood pressure stable     LOS: 13 Jamie-Lee Galdamez 11/24/202511:26 AM

## 2024-02-08 NOTE — Progress Notes (Signed)
 [facility] CLINIC CARDIOLOGY PROGRESS NOTE       Patient ID: Kelly Hogan MRN: [id number] DOB/AGE: [birth year] 83 y.o.  Admit date: 01/26/2024 Referring Physician Dr. Laneta Blunt Primary Physician [name], [name] LABOR, MD  Primary Cardiologist Damien Lee, NP Reason for Consultation AF, bacteremia  HPI: Kelly Hogan is a 83 y.o. female  with a past medical history of COPD, chronic respiratory failure on 4 L, hypertension, hyperlipidemia, type 2 diabetes, obesity, obesity hypoventilation syndrome, history of venous insufficiency who presented to the ED on 01/26/2024 for concern for leg infection.  Noted to be in atrial fibrillation RVR initially, placed on IV amiodarone  and ultimately converted to normal sinus rhythm.  Blood cultures were positive for Pseudomonas.  Cardiology was consulted for further evaluation.   Interval history: - Patient seen and examined this morning, resting comfortably in bed on baseline supplemental O2. - Alert this AM, she is without any complaints. - Remains in sinus rhythm.  Continues to tolerate p.o. amiodarone .  Review of systems complete and found to be negative unless listed above    Past Medical History:  Diagnosis Date   Asthma    COPD (chronic obstructive pulmonary disease) (HCC)    Diabetes mellitus without complication (HCC)    Hypertension    Morbid obesity with BMI of 50.0-59.9, adult (HCC) 07/13/2019   Stomach ulcer     No past surgical history on file.  Medications Prior to Admission  Medication Sig Dispense Refill Last Dose/Taking   acetaminophen  (TYLENOL ) 500 MG tablet Take 500-1,000 mg by mouth every 6 (six) hours as needed for mild pain or fever.    01/26/2024   albuterol  (PROVENTIL ) (2.5 MG/3ML) 0.083% nebulizer solution Take 3 mLs by nebulization every 6 (six) hours as needed for wheezing.   01/26/2024   aspirin  81 MG EC tablet Take 81 mg by mouth daily.   01/25/2024   carvedilol  (COREG ) 25 MG tablet Take 12.5 mg by mouth in  the morning and at bedtime.   01/25/2024   Cholecalciferol  25 MCG (1000 UT) tablet Take 1,000 Units by mouth daily.   01/25/2024   ciclopirox (LOPROX) 0.77 % cream Apply 0.77 % topically 2 (two) times daily.   01/25/2024   diclofenac Sodium (VOLTAREN) 1 % GEL Apply 2 g topically 4 (four) times daily.   01/26/2024   docusate sodium  (COLACE) 100 MG capsule Take 100 mg by mouth 2 (two) times daily.  4 01/26/2024   empagliflozin (JARDIANCE) 10 MG TABS tablet Take 1 tablet by mouth daily.   01/25/2024   fluticasone  (FLONASE ) 50 MCG/ACT nasal spray Place 1 spray into both nostrils daily.    01/25/2024   gatifloxacin (ZYMAXID) 0.5 % SOLN Place 1 drop into the left eye 4 (four) times daily.   Past Month   ketorolac (ACULAR) 0.5 % ophthalmic solution Place 1 drop into the left eye 2 (two) times daily.   Unknown   Lidocaine 4 % PTCH Place onto the skin.   Past Month   olmesartan (BENICAR) 5 MG tablet Take 5 mg by mouth daily.   01/25/2024   omeprazole (PRILOSEC) 20 MG capsule Take 20 mg by mouth daily.   01/25/2024   Oyster Shell 500 MG TABS Take 500 mg by mouth in the morning, at noon, and at bedtime.   01/25/2024   pravastatin  (PRAVACHOL ) 20 MG tablet Take 20 mg by mouth daily.   01/25/2024   prednisoLONE acetate (PRED FORTE) 1 % ophthalmic suspension Place 1 drop into the left eye  4 (four) times daily.   Past Month   PROAIR  HFA 108 (90 Base) MCG/ACT inhaler Inhale 2 puffs into the lungs every 4 (four) hours as needed for wheezing or shortness of breath.    01/26/2024   senna (SENOKOT) 8.6 MG tablet Take 1 tablet by mouth daily.   01/25/2024   Torsemide 40 MG TABS Take 40 mg by mouth 2 (two) times daily.   01/25/2024   TRELEGY ELLIPTA 100-62.5-25 MCG/INH AEPB Take 1 puff by mouth daily.   01/25/2024   amLODipine  (NORVASC ) 5 MG tablet Take 5 mg by mouth daily. (Patient not taking: Reported on 01/26/2024)   Not Taking   ferrous sulfate 324 MG TBEC Take 324 mg by mouth.   Unknown   furosemide  (LASIX ) 40 MG  tablet Take 40-80 mg by mouth as directed.  (Patient not taking: Reported on 01/26/2024)   Not Taking   hydrALAZINE  (APRESOLINE ) 25 MG tablet Take 25 mg by mouth 2 (two) times daily. (Patient not taking: Reported on 01/26/2024)   Not Taking   ipratropium-albuterol  (DUONEB) 0.5-2.5 (3) MG/3ML SOLN Take 3 mLs by nebulization every 6 (six) hours as needed (wheezing). (Patient not taking: Reported on 01/26/2024)   Not Taking   omeprazole (PRILOSEC) 20 MG capsule Take by mouth.      spironolactone  (ALDACTONE ) 25 MG tablet Take 25 mg by mouth daily. (Patient not taking: Reported on 01/26/2024)  3 Not Taking   telmisartan (MICARDIS) 80 MG tablet Take 80 mg by mouth daily. (Patient not taking: Reported on 01/26/2024)   Not Taking   Social History   Socioeconomic History   Marital status: Single    Spouse name: Not on file   Number of children: Not on file   Years of education: Not on file   Highest education level: Not on file  Occupational History   Not on file  Tobacco Use   Smoking status: Former    Types: Cigarettes   Smokeless tobacco: Never  Substance and Sexual Activity   Alcohol use: No   Drug use: No   Sexual activity: Not on file  Other Topics Concern   Not on file  Social History Narrative   Not on file   Social Drivers of Health   Financial Resource Strain: Low Risk (08/12/2023)   Received from [facility]   Overall Financial Resource Strain (CARDIA)    Difficulty of Paying Living Expenses: Not very hard  Food Insecurity: Patient Declined (01/27/2024)   Hunger Vital Sign    Worried About Running Out of Food in the Last Year: Patient declined    Ran Out of Food in the Last Year: Patient declined  Transportation Needs: Patient Declined (01/27/2024)   PRAPARE - Administrator, Civil Service (Medical): Patient declined    Lack of Transportation (Non-Medical): Patient declined  Physical Activity: Not on file  Stress: No Stress Concern Present (11/17/2023)    Received from [facility] - Occupational Stress Questionnaire    Do you feel stress - tense, restless, nervous, or anxious, or unable to sleep at night because your mind is troubled all the time - these days?: Only a little  Social Connections: Patient Declined (01/27/2024)   Social Connection and Isolation Panel    Frequency of Communication with Friends and Family: Patient declined    Frequency of Social Gatherings with Friends and Family: Patient declined    Attends Religious Services: Patient declined    Active  Member of Clubs or Organizations: Patient declined    Attends Banker Meetings: Patient declined    Marital Status: Patient declined  Intimate Partner Violence: Patient Declined (01/27/2024)   Humiliation, Afraid, Rape, and Kick questionnaire    Fear of Current or Ex-Partner: Patient declined    Emotionally Abused: Patient declined    Physically Abused: Patient declined    Sexually Abused: Patient declined    Family History  Problem Relation Age of Onset   Hypertension Sister    Diabetes Mellitus II Brother      Vitals:   02/08/24 0429 02/08/24 0432 02/08/24 0600 02/08/24 0751  BP: (!) 131/56   (!) 121/55  Pulse:    69  Resp:    16  Temp: (!) 97.5 F (36.4 C)   98 F (36.7 C)  TempSrc: Axillary     SpO2: 95%  95% 100%  Weight:  131.1 kg    Height:        PHYSICAL EXAM General: Chronically ill appearing female, well nourished, in no acute distress. HEENT: Normocephalic and atraumatic. Neck: No JVD.  Lungs: Normal respiratory effort on 4L. Coarse breath sounds bilaterally.  Heart: HRRR. Normal S1 and S2 without gallops or murmurs.  Abdomen: Non-distended appearing.  Msk: Normal strength and tone for age. Extremities: Warm and well perfused. No clubbing, cyanosis. Bilateral LE with edema and erythema, wraps in place.  Neuro: Alert and oriented X 3. Psych: Answers questions appropriately.   Labs: Basic  Metabolic Panel: Recent Labs    02/07/24 0440 02/08/24 0311  NA 132* 132*  K 4.8 5.2*  CL 88* 89*  CO2 35* 35*  GLUCOSE 100* 103*  BUN 101* 101*  CREATININE 2.92* 2.73*  CALCIUM  9.5 9.4   Liver Function Tests: No results for input(s): AST, ALT, ALKPHOS, BILITOT, PROT, ALBUMIN  in the last 72 hours.  No results for input(s): LIPASE, AMYLASE in the last 72 hours. CBC: Recent Labs    02/06/24 0425  WBC 7.3  NEUTROABS 5.2  HGB 9.4*  HCT 30.9*  MCV 91.2  PLT 281   Cardiac Enzymes: No results for input(s): CKTOTAL, CKMB, CKMBINDEX, TROPONINIHS in the last 72 hours. BNP: No results for input(s): BNP in the last 72 hours. D-Dimer: No results for input(s): DDIMER in the last 72 hours. Hemoglobin A1C: No results for input(s): HGBA1C in the last 72 hours. Fasting Lipid Panel: No results for input(s): CHOL, HDL, LDLCALC, TRIG, CHOLHDL, LDLDIRECT in the last 72 hours. Thyroid Function Tests: No results for input(s): TSH, T4TOTAL, T3FREE, THYROIDAB in the last 72 hours.  Invalid input(s): FREET3  Anemia Panel: No results for input(s): VITAMINB12, FOLATE, FERRITIN, TIBC, IRON, RETICCTPCT in the last 72 hours.   Radiology: US  RENAL Result Date: 01/29/2024 CLINICAL DATA:  Acute renal failure. EXAM: RENAL / URINARY TRACT ULTRASOUND COMPLETE COMPARISON:  None Available. FINDINGS: Right Kidney: Renal measurements: 10.8 x 4.5 x 5.2 cm = volume: 131 mL. Echogenicity within normal limits. No mass or hydronephrosis visualized. Left Kidney: Renal measurements: 11.2 x 5.2 x 5.0 cm = volume: 150 mL. Echogenicity within normal limits. No mass or hydronephrosis visualized. Limited definition of both kidneys by ultrasound due to body habitus. Bladder: Appears normal for degree of bladder distention. Other: None. IMPRESSION: Unremarkable renal ultrasound. Electronically Signed   By: Marcey Moan M.D.   On: 01/29/2024 16:06   DG  Chest Port 1 View Result Date: 01/28/2024 CLINICAL DATA:  Shortness of breath EXAM: PORTABLE CHEST 1 VIEW COMPARISON:  Chest  x-ray 01/26/2024 FINDINGS: Heart is enlarged. There central pulmonary vascular congestion. There some minimal patchy opacities in the right mid lung. The costophrenic angles are clear. No pneumothorax or acute fracture. There are degenerative changes of the left shoulder. IMPRESSION: 1. Cardiomegaly with central pulmonary vascular congestion. 2. Minimal patchy opacities in the right mid lung may represent atelectasis or infection. Electronically Signed   By: Greig Pique M.D.   On: 01/28/2024 17:49   ECHOCARDIOGRAM COMPLETE Result Date: 01/28/2024    ECHOCARDIOGRAM REPORT   Patient Name:   Kelly Hogan Date of Exam: 01/28/2024 Medical Rec #:  [id number]         Height:       64.0 in Accession #:    [id number]        Weight:       308.6 lb Date of Birth:  [birth year]         BSA:          2.353 m Patient Age:    83 years          BP:           117/59 mmHg Patient Gender: F                 HR:           89 bpm. Exam Location:  [facility] Procedure: 2D Echo, Cardiac Doppler and Color Doppler (Both Spectral and Color            Flow Doppler were utilized during procedure). Indications:     Abnormal ECG R94.31  History:         Patient has no prior history of Echocardiogram examinations.                  COPD; Risk Factors:Hypertension and Diabetes.  Sonographer:     Christopher Furnace Referring Phys:  [id number] [facility] Diagnosing Phys: Marsa Dooms MD  Sonographer Comments: Technically challenging study due to limited acoustic windows, patient is obese and no apical window. IMPRESSIONS  1. Left ventricular ejection fraction, by estimation, is 60 to 65%. The left ventricle has normal function. The left ventricle has no regional wall motion abnormalities. Left ventricular diastolic parameters are indeterminate.  2. Right ventricular systolic function is normal. The right ventricular size is  normal.  3. The mitral valve is normal in structure. Mild mitral valve regurgitation. No evidence of mitral stenosis.  4. The aortic valve is normal in structure. Aortic valve regurgitation is not visualized. No aortic stenosis is present.  5. The inferior vena cava is normal in size with greater than 50% respiratory variability, suggesting right atrial pressure of 3 mmHg. FINDINGS  Left Ventricle: Left ventricular ejection fraction, by estimation, is 60 to 65%. The left ventricle has normal function. The left ventricle has no regional wall motion abnormalities. Strain was performed and the global longitudinal strain is indeterminate. The left ventricular internal cavity size was normal in size. There is no left ventricular hypertrophy. Left ventricular diastolic parameters are indeterminate. Right Ventricle: The right ventricular size is normal. No increase in right ventricular wall thickness. Right ventricular systolic function is normal. Left Atrium: Left atrial size was normal in size. Right Atrium: Right atrial size was normal in size. Pericardium: There is no evidence of pericardial effusion. Mitral Valve: The mitral valve is normal in structure. Mild mitral valve regurgitation. No evidence of mitral valve stenosis. Tricuspid Valve: The tricuspid valve is normal in structure. Tricuspid valve regurgitation is mild .  No evidence of tricuspid stenosis. Aortic Valve: The aortic valve is normal in structure. Aortic valve regurgitation is not visualized. No aortic stenosis is present. Pulmonic Valve: The pulmonic valve was normal in structure. Pulmonic valve regurgitation is not visualized. No evidence of pulmonic stenosis. Aorta: The aortic root is normal in size and structure. Venous: The inferior vena cava is normal in size with greater than 50% respiratory variability, suggesting right atrial pressure of 3 mmHg. IAS/Shunts: No atrial level shunt detected by color flow Doppler. Additional Comments: 3D was  performed not requiring image post processing on an independent workstation and was indeterminate.  LEFT VENTRICLE PLAX 2D LVIDd:         4.80 cm LVIDs:         2.80 cm LV PW:         1.00 cm LV IVS:        1.10 cm LVOT diam:     2.00 cm LVOT Area:     3.14 cm  LEFT ATRIUM         Index LA diam:    2.90 cm 1.23 cm/m   AORTA Ao Root diam: 2.70 cm  SHUNTS Systemic Diam: 2.00 cm Marsa Dooms MD Electronically signed by Marsa Dooms MD Signature Date/Time: 01/28/2024/1:33:28 PM    Final    US  Venous Img Lower Unilateral Left (DVT) Result Date: 01/27/2024 EXAM: ULTRASOUND DUPLEX OF THE LEFT LOWER EXTREMITY VEINS TECHNIQUE: Duplex ultrasound using B-mode/gray scaled imaging and Doppler spectral analysis and color flow was obtained of the deep venous structures of the left lower extremity. COMPARISON: None available. CLINICAL HISTORY: Swelling. FINDINGS: There is no evidence of deep venous thrombosis within the left lower extremity. The common femoral vein, femoral vein, and popliteal vein of the left lower extremity demonstrate normal compressibility with normal color flow and spectral analysis. There is limited visualization of the left posterior tibial veins and the left peroneal veins are not identified. IMPRESSION: 1. No evidence of DVT in the left lower extremity. 2. Limited visualization of the left posterior tibial veins and the left peroneal veins are not identified. Electronically signed by: Evalene Coho MD 01/27/2024 05:43 AM EST RP Workstation: [id number]   DG Chest Port 1 View Result Date: 01/26/2024 CLINICAL DATA:  Questionable sepsis EXAM: PORTABLE CHEST 1 VIEW COMPARISON:  Chest radiograph dated 07/07/2019. FINDINGS: No focal consolidation, pleural effusion or pneumothorax. Top-normal cardiac size. No acute osseous pathology. IMPRESSION: No active disease. Electronically Signed   By: Vanetta Chou M.D.   On: 01/26/2024 13:58    ECHO as above  TELEMETRY (personally  reviewed): Sinus rhythm rate 60s  EKG (personally reviewed): Sinus rhythm rate 81 bpm  Data reviewed by me 02/08/2024: last 24h vitals tele labs imaging I/O ED provider note, admission H&P, hospitalist progress note, ID note, nephrology note  Principal Problem:   Sepsis due to cellulitis [facility]) Active Problems:   Essential (primary) hypertension   Type 2 diabetes mellitus (HCC)   Chronic venous insufficiency   HLD (hyperlipidemia)   Morbid obesity with BMI of 50.0-59.9, adult (HCC)   OSA (obstructive sleep apnea)   Obesity hypoventilation syndrome (HCC)   (HFpEF) heart failure with preserved ejection fraction (HCC)   Bacteremia due to Pseudomonas   CO2 narcosis   Metabolic encephalopathy   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nostras verrucosa   Cellulitis of left lower extremity    ASSESSMENT AND PLAN:  ASHLON LOTTMAN is a 83 y.o. female  with a past medical history of COPD, chronic respiratory failure on  4 L, hypertension, hyperlipidemia, type 2 diabetes, obesity, obesity hypoventilation syndrome, history of venous insufficiency who presented to the ED on 01/26/2024 for concern for leg infection.  Noted to be in atrial fibrillation RVR initially, placed on IV amiodarone  and ultimately converted to normal sinus rhythm.  Blood cultures were positive for Pseudomonas.  Cardiology was consulted for further evaluation.   # Acute on chronic hypoxic respiratory failure # Atrial fibrillation RVR # New onset atrial fibrillation # Cellulitis # Pseudomonas bacteremia # Hypertension Patient sent to the ED for evaluation of cellulitis, ultimately developed atrial fibrillation RVR which was treated with IV amiodarone  and she converted to normal sinus rhythm yesterday.  Found to have positive blood cultures growing Pseudomonas.  Last week developed worsening hypoxia and was placed on BiPAP, now on nasal cannula.  Echo this admission with EF 60-65%, no WMA's, mild MR. - Continue p.o. amiodarone  load with 400 mg twice  daily for 7 days followed by 200 mg daily. - Continue Eliquis  2.5 mg twice daily (dose appropriate given age, renal function). - Continue pravastatin  20 mg daily. - Reviewed ID notes, no mention of TEE and TTE from this admission did not show any significant valvular issues. - Further management of cellulitis, bacteremia as per primary team.  Cardiology will sign off. Please haiku with questions or re-engage if needed. Follow up with Damien Lee, NP [facility]) in 1-2 weeks.   This patient's plan of care was discussed and created with Dr. Custovic and she is in agreement.  Signed: Danita Bloch, PA-C  02/08/2024, 8:30 AM [facility] Cardiology

## 2024-02-08 NOTE — Progress Notes (Signed)
 Physical Therapy Treatment Patient Details Name: Kelly Hogan MRN: [id number] DOB: June 04, 1940 Today's Date: 02/08/2024   History of Present Illness Kelly Hogan is a 83 y.o. 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 She was placed on BiPAP and uses 4L at baseline.    PT Comments  Pt received in bed for co-tx session with OT in order to provide safe assistance during progressive mobility. Pt with improved ability to transfer to EOB requiring MinA of 2 and leverage from bed rail. Good static sitting balance for several minutes while on 3L O2 with SpO2 99%. Pt completed several sit<>stands at EOB with RW for assist with hygiene. Mod/MinA x 2 to power up to standing. Pt able to take several steps with heavy reliance on RW to transfer to bedside chair. No LOB or buckling, SpO2 down to 89% briefly. Good recovery once resting in chair. All needs in reach. Continue to recommend STR at d/c.   If plan is discharge home, recommend the following: Two people to help with walking and/or transfers;Two people to help with bathing/dressing/bathroom;Assist for transportation   Can travel by private vehicle     No  Equipment Recommendations  Other (comment) (TBD)    Recommendations for Other Services       Precautions / Restrictions Precautions Precautions: Fall Recall of Precautions/Restrictions: Intact Restrictions Weight Bearing Restrictions Per Provider Order: No     Mobility  Bed Mobility Overal bed mobility: Needs Assistance Bed Mobility: Supine to Sit     Supine to sit: Min assist, +2 for physical assistance, HOB elevated, Used rails     General bed mobility comments:  (Pt able to sit EOB for several minutes with distant  Supervision)    Transfers Overall transfer level: Needs assistance Equipment used: Rolling walker (2 wheels) Transfers: Sit to/from Stand Sit to Stand: +2 physical assistance, Mod assist, Min assist           General transfer comment:  (Several sit to stand transfers from EOB for hygiene assist in standing with rest breaks)    Ambulation/Gait               General Gait Details: performed 20ft of ambulation with RW and +2 min/mod assist to step pivot to recliner; no buckling or LOB noted   Stairs             Wheelchair Mobility     Tilt Bed    Modified Rankin (Stroke Patients Only)       Balance Overall balance assessment: Needs assistance Sitting-balance support: No upper extremity supported, Feet supported Sitting balance-Leahy Scale: Fair Sitting balance - Comments: supervision/CGA for EOB balance without BUE support   Standing balance support: Reliant on assistive device for balance, Bilateral upper extremity supported, During functional activity Standing balance-Leahy Scale: Fair Standing balance comment: Heavy reliance on RW for balance. Multimodal cuing to maintain upright posture.                            Communication Communication Communication: Impaired Factors Affecting Communication: Reduced clarity of speech  Cognition Arousal: Alert Behavior During Therapy: WFL for tasks assessed/performed   PT - Cognitive impairments: No apparent impairments  PT - Cognition Comments: Pleasant and agreeable to PT session. Following commands: Intact      Cueing Cueing Techniques: Verbal cues  Exercises Other Exercises Other Exercises:  (Pt educated on role of acute PT and benefits of STR)    General Comments        Pertinent Vitals/Pain Pain Assessment Pain Assessment: Faces Faces Pain Scale: Hurts little more Pain Location:  (Open area at coccyx) Pain Descriptors / Indicators: Tender    Home  Living                          Prior Function            PT Goals (current goals can now be found in the care plan section) Acute Rehab PT Goals Patient Stated Goal: none stated Progress towards PT goals: Progressing toward goals    Frequency    Min 2X/week      PT Plan      Co-evaluation   Reason for Co-Treatment: Complexity of the patient's impairments (multi-system involvement) PT goals addressed during session: Mobility/safety with mobility OT goals addressed during session: ADL's and self-care      AM-PAC PT 6 Clicks Mobility   Outcome Measure  Help needed turning from your back to your side while in a flat bed without using bedrails?: Total Help needed moving from lying on your back to sitting on the side of a flat bed without using bedrails?: A Lot Help needed moving to and from a bed to a chair (including a wheelchair)?: A Lot Help needed standing up from a chair using your arms (e.g., wheelchair or bedside chair)?: A Lot Help needed to walk in hospital room?: Total Help needed climbing 3-5 steps with a railing? : Total 6 Click Score: 9    End of Session Equipment Utilized During Treatment: Oxygen Activity Tolerance: Patient tolerated treatment well Patient left: in chair;with call bell/phone within reach Nurse Communication: Mobility status PT Visit Diagnosis: Muscle weakness (generalized) (M62.81);Other abnormalities of gait and mobility (R26.89)     Time: 8870-8844 PT Time Calculation (min) (ACUTE ONLY): 26 min  Charges:    $Therapeutic Activity: 8-22 mins PT General Charges $$ ACUTE PT VISIT: 1 Visit                    Darice Bohr, PTA  Darice JAYSON Bohr 02/08/2024, 1:34 PM

## 2024-02-08 NOTE — TOC Transition Note (Signed)
 Transition of Care [facility]) - Discharge Note   Patient Details  Name: MAKENIZE MESSMAN MRN: [id number] Date of Birth: [birth year]  Transition of Care [facility]) CM/SW Contact:  Alfonso Rummer, LCSW Phone Number: 02/08/2024, 11:40 AM   Clinical Narrative:     Pt will transition to [facility] via [facility] for short term rehab. Pt family notified via phone. Brianna admission rep with [facility] aware to order pt bipap 02/05/24.   Final next level of care: Skilled Nursing Facility Barriers to Discharge: Barriers Resolved   Patient Goals and CMS Choice     Choice offered to / list presented to : Patient, Adult Children      Discharge Placement PASRR number recieved: 02/08/24            Patient chooses bed at: Other - please specify in the comment section below: ([facility]) Patient to be transferred to facility by: [facility] Name of family member notified: Pt son Kaisy Severino [phone] Patient and family notified of of transfer: 02/08/24  Discharge Plan and Services Additional resources added to the After Visit Summary for                  DME Arranged: Bipap DME Agency: Other - Comment ([facility] will order bipap)                  Social Drivers of Health (SDOH) Interventions SDOH Screenings   Food Insecurity: Patient Declined (01/27/2024)  Housing: Patient Declined (01/27/2024)  Transportation Needs: Patient Declined (01/27/2024)  Utilities: Patient Declined (01/27/2024)  Financial Resource Strain: Low Risk (08/12/2023)   Received from [facility]  Social Connections: Patient Declined (01/27/2024)  Stress: No Stress Concern Present (11/17/2023)   Received from [facility]  Tobacco Use: Medium Risk (12/21/2023)   Received from [facility] Literacy: Low Risk (07/06/2023)   Received [facility]     Readmission Risk Interventions     No data to display

## 2024-02-08 NOTE — Discharge Summary (Signed)
 Physician Discharge Summary   Patient: Kelly Hogan MRN: [id number] DOB: Oct 27, 1940  Admit date:     01/26/2024  Discharge date: 02/08/24  Discharge Physician: Drue ONEIDA Potter   PCP: [name] [name] LABOR, MD   Recommendations at discharge:  Follow-up with cardiology and nephrology  Discharge Diagnoses: Principal Problem:   Sepsis due to cellulitis [facility]) Active Problems:   Essential (primary) hypertension   Type 2 diabetes mellitus (HCC)   Chronic venous insufficiency   HLD (hyperlipidemia)   Morbid obesity with BMI of 50.0-59.9, adult (HCC)   OSA (obstructive sleep apnea)   Obesity hypoventilation syndrome (HCC)   (HFpEF) heart failure with preserved ejection fraction (HCC)   Bacteremia due to Pseudomonas   CO2 narcosis   Metabolic encephalopathy   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nostras verrucosa   Cellulitis of left lower extremity  Resolved Problems:   * No resolved hospital problems. *  Hospital Course:   From HPI : Kelly Hogan is a 83 y.o. 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    The rest of hospital course as noted below     11/11: admitted to hospitalist w/ hypotension, cellulitis. Metronidazole  and cefepime  in the ED. SVT with rates of 140s, pt requiring amio gtt.   11/12: (+)Pseudomonas bacteremia - linezolid  and vancomycin . Remains on amio gtt for now, cardiology will see in AM. Was off amio briefly this evening d/t limited IV access and needed other Rx, HR was WNL but then up again and back on the drip. Renal fxn worse, resp acidosis and hypercarbia, pt placed on BiPAP. Fluids changed to bicarb infusion 1L. Hyperkalemia, mild, gave K shifting Rx w/ insulin /D50, also admin Lokelma .  11/13: renal fxn continuing to worsen -  Nephrology consulted. Pt remains on BiPAP this morning. Was off briefly per her request but O2 down and SOB so placed back on the BiPAP. BP soft, gave another 1L fluids. Cardiology - transition to po amio, starting eliquis . ID recs change ceftazidime  d/t myoclonus effects and confusion. Still worsening into the afternoon, CXR and BNP indicative of HFpEF w/ pulmonary edema despite fairly clear lungs (but exam difficult). Son is ok for trial of diuretics despite renal fxn.  11/14: good UOP and respiratory is improved onto [place] O2, renal fxn about same. Continuing on ceftazidime . Changed morphine  to dilaudid  w/ renal fxn. Renal US  no concerns. Cr 2.86. BCx repeated. Requiring BiPAP at night  11/15: somnolent but alerts to voice. Has not wanted to sit up in bed/chair. Cr 2.79. UOP yesterday looks like under documented. Continue diuresis. Repeat BCx NG thus far.  11/16: Cr 2.32, up to chair today encouraging.  11/17: Cr 2.15, CO2 levels higher today on BMP, pt refusing BiPAP overnight, encouraged once again she needs to wear at night, needs to be up to chair as much as possible, incentive spirometer. Per ID since she has improved and wbc normalized can complete a total 7 days of IV antibiotic (ceftazidime )  11/18: much more alert today after being on BiPAP last night.    Consultants:  Cardiology  Infectious disease  Nephrology   Procedures/Surgeries: none   ASSESSMENT & PLAN:   Cellulitis of the left lower extremity. Present on admission and ruled out DVT (though US  exam somewhat limited) (+)Pseudomonas bacteremia  Dc cefepime  d/t myoclonus / neuro effects.  Completed ceftazidime .  Has completed 7 days course of antibiotics Infectious disease on board   HFpEF Exacerbation d/t SVT + fluid tx per sepsis protocol  Echo noted preserved EF but unable to assess diastolic fxn, likely is dysfunctional. No significant valve dysfunction.  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   Midodrine  discontinued hold antihypertensives. Cardiology following    Type 2 diabetes Monitor glc Give insulin  as needed   Hyperlipidemia:  statin   Chronic venous stasis Rec follow w/ lymphedema clinic    Skin nodularity bilateral lower extremities Question hyperkeratotic lesions? Keloid type lesions?  Per ID these c/w verrucous changes and fibrous changes suggestive of elephantiasis nostras verrucosa  See photos Appear benign but will complicate hygiene - monitor closely for infection / debris buildup Continue wound care Continue treatment for cellulitis   GOC  Of care conversation was had with patient's son on November 16 Continue DNI DNR     Super Morbid Obesity based on BMI: Body mass index is 47.23 kg/m.SABRA Significantly low or high BMI is associated with higher medical risk.  Healthy nutrition and physical activity advised as adjunct to other disease management and risk reduction treatments     Disposition: Skilled nursing facility Diet recommendation:  Cardiac and Carb modified diet DISCHARGE MEDICATION: Allergies as of 02/08/2024       Reactions    Enalapril Swelling   angioedema   Oxycodone Shortness Of Breath   Oxycodone-acetaminophen  Shortness Of Breath   Ranitidine Hcl Swelling   Aspirin     Hydrochlorothiazide Rash   Penicillins Rash        Medication List     STOP taking these medications    amLODipine  5 MG tablet Commonly known as: NORVASC    aspirin  EC 81 MG tablet   empagliflozin 10 MG Tabs tablet Commonly known as: JARDIANCE   furosemide  40 MG tablet Commonly known as: LASIX    hydrALAZINE  25 MG tablet Commonly known as: APRESOLINE    olmesartan 5 MG tablet Commonly known as: BENICAR   spironolactone  25 MG tablet Commonly known as: ALDACTONE    telmisartan 80 MG tablet Commonly known as: MICARDIS   Torsemide 40 MG Tabs       TAKE these medications    acetaminophen  500 MG tablet Commonly known as: TYLENOL  Take 500-1,000 mg by mouth every 6 (six) hours as needed for mild pain or fever.   amiodarone  200 MG tablet Commonly known as: PACERONE  Take 1 tablet (200 mg total) by mouth daily.   apixaban  2.5 MG Tabs tablet Commonly known as: ELIQUIS  Take 1 tablet (2.5 mg total) by mouth 2 (two) times daily.   carvedilol  25  MG tablet Commonly known as: COREG  Take 12.5 mg by mouth in the morning and at bedtime.   Cholecalciferol  25 MCG (1000 UT) tablet Take 1,000 Units by mouth daily.   ciclopirox 0.77 % cream Commonly known as: LOPROX Apply 0.77 % topically 2 (two) times daily.   diclofenac Sodium 1 % Gel Commonly known as: VOLTAREN Apply 2 g topically 4 (four) times daily.   docusate sodium  100 MG capsule Commonly known as: COLACE Take 100 mg by mouth 2 (two) times daily.   ferrous sulfate 324 MG Tbec Take 324 mg by mouth.   fluticasone  50 MCG/ACT nasal spray Commonly known as: FLONASE  Place 1 spray into both nostrils daily.   gatifloxacin 0.5 % Soln Commonly known as: ZYMAXID Place 1 drop into the left eye 4 (four) times daily.   ipratropium-albuterol  0.5-2.5 (3) MG/3ML  Soln Commonly known as: DUONEB Take 3 mLs by nebulization every 6 (six) hours as needed (wheezing).   ketorolac 0.5 % ophthalmic solution Commonly known as: ACULAR Place 1 drop into the left eye 2 (two) times daily.   lidocaine 4 % Place onto the skin.   omeprazole 20 MG capsule Commonly known as: PRILOSEC Take 20 mg by mouth daily. What changed: Another medication with the same name was removed. Continue taking this medication, and follow the directions you see here.   Oyster Shell 500 MG Tabs Take 500 mg by mouth in the morning, at noon, and at bedtime.   pravastatin  20 MG tablet Commonly known as: PRAVACHOL  Take 20 mg by mouth daily.   prednisoLONE acetate 1 % ophthalmic suspension Commonly known as: PRED FORTE Place 1 drop into the left eye 4 (four) times daily.   ProAir  HFA 108 (90 Base) MCG/ACT inhaler Generic drug: albuterol  Inhale 2 puffs into the lungs every 4 (four) hours as needed for wheezing or shortness of breath.   albuterol  (2.5 MG/3ML) 0.083% nebulizer solution Commonly known as: PROVENTIL  Take 3 mLs by nebulization every 6 (six) hours as needed for wheezing.   senna 8.6 MG tablet Commonly known as: SENOKOT Take 1 tablet by mouth daily.   Trelegy Ellipta 100-62.5-25 MCG/INH Aepb Generic drug: Fluticasone -Umeclidin-Vilant Take 1 puff by mouth daily.               Durable Medical Equipment  (From admission, onward)           Start     Ordered   02/02/24 1558  For home use only DME Bipap  Once       Question:  Length of Need  Answer:  Lifetime   02/02/24 1558            Contact information for follow-up providers     Lennie, Damien Augusta, NP. Go in 1 week(s).   Specialty: Internal Medicine Contact information: [address] [place] [address] [phone]              Contact information for after-discharge care     Destination     [facility] .   Service: Skilled  Nursing Contact information: [address] [place]  [address] [phone]                    Discharge Exam: Filed Weights   02/06/24 0500 02/07/24 0349 02/08/24 0432  Weight: 129.5 kg 131.8 kg 131.1 kg   Constitutional: Elderly female sitting up in a chair mental status improved    Rate and Rhythm:  Normal rate and regular rhythm.  Pulmonary: Decreased air entry bibasilarly Musculoskeletal: Dressing on bilateral lower extremity clean and dry Skin: Neurological:     Mental Status: She is alert and oriented to person, place, and time. Mental status is at baseline.  Psychiatric:        Mood and Affect: Mood normal.        Behavior: Behavior normal.   Condition at discharge: good  The results of significant diagnostics from this hospitalization (including imaging, microbiology, ancillary and laboratory) are listed below for reference.   Imaging Studies: US  RENAL Result Date: 01/29/2024 CLINICAL DATA:  Acute renal failure. EXAM: RENAL / URINARY TRACT ULTRASOUND COMPLETE COMPARISON:  None Available. FINDINGS: Right Kidney: Renal measurements: 10.8 x 4.5 x 5.2 cm = volume: 131 mL. Echogenicity within normal limits. No mass or hydronephrosis visualized. Left Kidney: Renal measurements: 11.2 x 5.2 x 5.0 cm = volume: 150 mL. Echogenicity within normal limits. No mass or hydronephrosis visualized. Limited definition of both kidneys by ultrasound due to body habitus. Bladder: Appears normal for degree of bladder distention. Other: None. IMPRESSION: Unremarkable renal ultrasound. Electronically Signed   By: Marcey Moan M.D.   On: 01/29/2024 16:06   DG Chest Port 1 View Result Date: 01/28/2024 CLINICAL DATA:  Shortness of breath EXAM: PORTABLE CHEST 1 VIEW COMPARISON:  Chest x-ray 01/26/2024 FINDINGS: Heart is enlarged. There central pulmonary vascular congestion. There some minimal patchy opacities in the right mid lung. The costophrenic angles are clear. No pneumothorax  or acute fracture. There are degenerative changes of the left shoulder. IMPRESSION: 1. Cardiomegaly with central pulmonary vascular congestion. 2. Minimal patchy opacities in the right mid lung may represent atelectasis or infection. Electronically Signed   By: Greig Pique M.D.   On: 01/28/2024 17:49   ECHOCARDIOGRAM COMPLETE Result Date: 01/28/2024    ECHOCARDIOGRAM REPORT   Patient Name:   ANYSHA FRAPPIER Date of Exam: 01/28/2024 Medical Rec #:  [id number]         Height:       64.0 in Accession #:    [id number]        Weight:       308.6 lb Date of Birth:  1940-09-19         BSA:          2.353 m Patient Age:    83 years          BP:           117/59 mmHg Patient Gender: F                 HR:           89 bpm. Exam Location:  [facility] Procedure: 2D Echo, Cardiac Doppler and Color Doppler (Both Spectral and Color            Flow Doppler were utilized during procedure). Indications:     Abnormal ECG R94.31  History:         Patient has no prior history of Echocardiogram examinations.                  COPD; Risk Factors:Hypertension and Diabetes.  Sonographer:     Christopher Furnace Referring Phys:  [id number] [facility] Diagnosing Phys: Marsa Dooms MD  Sonographer Comments: Technically challenging study due to limited acoustic windows, patient is obese and no apical window. IMPRESSIONS  1. Left ventricular ejection fraction, by estimation, is 60 to 65%. The left ventricle has normal function. The left ventricle  has no regional wall motion abnormalities. Left ventricular diastolic parameters are indeterminate.  2. Right ventricular systolic function is normal. The right ventricular size is normal.  3. The mitral valve is normal in structure. Mild mitral valve regurgitation. No evidence of mitral stenosis.  4. The aortic valve is normal in structure. Aortic valve regurgitation is not visualized. No aortic stenosis is present.  5. The inferior vena cava is normal in size with greater than 50% respiratory variability,  suggesting right atrial pressure of 3 mmHg. FINDINGS  Left Ventricle: Left ventricular ejection fraction, by estimation, is 60 to 65%. The left ventricle has normal function. The left ventricle has no regional wall motion abnormalities. Strain was performed and the global longitudinal strain is indeterminate. The left ventricular internal cavity size was normal in size. There is no left ventricular hypertrophy. Left ventricular diastolic parameters are indeterminate. Right Ventricle: The right ventricular size is normal. No increase in right ventricular wall thickness. Right ventricular systolic function is normal. Left Atrium: Left atrial size was normal in size. Right Atrium: Right atrial size was normal in size. Pericardium: There is no evidence of pericardial effusion. Mitral Valve: The mitral valve is normal in structure. Mild mitral valve regurgitation. No evidence of mitral valve stenosis. Tricuspid Valve: The tricuspid valve is normal in structure. Tricuspid valve regurgitation is mild . No evidence of tricuspid stenosis. Aortic Valve: The aortic valve is normal in structure. Aortic valve regurgitation is not visualized. No aortic stenosis is present. Pulmonic Valve: The pulmonic valve was normal in structure. Pulmonic valve regurgitation is not visualized. No evidence of pulmonic stenosis. Aorta: The aortic root is normal in size and structure. Venous: The inferior vena cava is normal in size with greater than 50% respiratory variability, suggesting right atrial pressure of 3 mmHg. IAS/Shunts: No atrial level shunt detected by color flow Doppler. Additional Comments: 3D was performed not requiring image post processing on an independent workstation and was indeterminate.  LEFT VENTRICLE PLAX 2D LVIDd:         4.80 cm LVIDs:         2.80 cm LV PW:         1.00 cm LV IVS:        1.10 cm LVOT diam:     2.00 cm LVOT Area:     3.14 cm  LEFT ATRIUM         Index LA diam:    2.90 cm 1.23 cm/m   AORTA Ao Root  diam: 2.70 cm  SHUNTS Systemic Diam: 2.00 cm Marsa Dooms MD Electronically signed by Marsa Dooms MD Signature Date/Time: 01/28/2024/1:33:28 PM    Final    US  Venous Img Lower Unilateral Left (DVT) Result Date: 01/27/2024 EXAM: ULTRASOUND DUPLEX OF THE LEFT LOWER EXTREMITY VEINS TECHNIQUE: Duplex ultrasound using B-mode/gray scaled imaging and Doppler spectral analysis and color flow was obtained of the deep venous structures of the left lower extremity. COMPARISON: None available. CLINICAL HISTORY: Swelling. FINDINGS: There is no evidence of deep venous thrombosis within the left lower extremity. The common femoral vein, femoral vein, and popliteal vein of the left lower extremity demonstrate normal compressibility with normal color flow and spectral analysis. There is limited visualization of the left posterior tibial veins and the left peroneal veins are not identified. IMPRESSION: 1. No evidence of DVT in the left lower extremity. 2. Limited visualization of the left posterior tibial veins and the left peroneal veins are not identified. Electronically signed by: Evalene Coho MD 01/27/2024 05:43 AM  EST RP Workstation: [id number]   DG Chest Port 1 View Result Date: 01/26/2024 CLINICAL DATA:  Questionable sepsis EXAM: PORTABLE CHEST 1 VIEW COMPARISON:  Chest radiograph dated 07/07/2019. FINDINGS: No focal consolidation, pleural effusion or pneumothorax. Top-normal cardiac size. No acute osseous pathology. IMPRESSION: No active disease. Electronically Signed   By: Vanetta Chou M.D.   On: 01/26/2024 13:58    Microbiology: Results for orders placed or performed during the hospital encounter of 01/26/24  Resp panel by RT-PCR (RSV, Flu A&B, Covid) Anterior Nasal Swab     Status: None   Collection Time: 01/26/24 12:56 PM   Specimen: Anterior Nasal Swab  Result Value Ref Range Status   SARS Coronavirus 2 by RT PCR NEGATIVE NEGATIVE Final    Comment: (NOTE) SARS-CoV-2 target  nucleic acids are NOT DETECTED.  The SARS-CoV-2 RNA is generally detectable in upper respiratory specimens during the acute phase of infection. The lowest concentration of SARS-CoV-2 viral copies this assay can detect is 138 copies/mL. A negative result does not preclude SARS-Cov-2 infection and should not be used as the sole basis for treatment or other patient management decisions. A negative result may occur with  improper specimen collection/handling, submission of specimen other than nasopharyngeal swab, presence of viral mutation(s) within the areas targeted by this assay, and inadequate number of viral copies(<138 copies/mL). A negative result must be combined with clinical observations, patient history, and epidemiological information. The expected result is Negative.  Fact Sheet for Patients:  bloggercourse.com  Fact Sheet for Healthcare Providers:  seriousbroker.it  This test is no t yet approved or cleared by the [place]  FDA and  has been authorized for detection and/or diagnosis of SARS-CoV-2 by FDA under an Emergency Use Authorization (EUA). This EUA will remain  in effect (meaning this test can be used) for the duration of the COVID-19 declaration under Section 564(b)(1) of the Act, 21 U.S.C.section 360bbb-3(b)(1), unless the authorization is terminated  or revoked sooner.       Influenza A by PCR NEGATIVE NEGATIVE Final   Influenza B by PCR NEGATIVE NEGATIVE Final    Comment: (NOTE) The Xpert Xpress SARS-CoV-2/FLU/RSV plus assay is intended as an aid in the diagnosis of influenza from Nasopharyngeal swab specimens and should not be used as a sole basis for treatment. Nasal washings and aspirates are unacceptable for Xpert Xpress SARS-CoV-2/FLU/RSV testing.  Fact Sheet for Patients: bloggercourse.com  Fact Sheet for Healthcare  Providers: seriousbroker.it  This test is not yet approved or cleared by the [place]  FDA and has been authorized for detection and/or diagnosis of SARS-CoV-2 by FDA under an Emergency Use Authorization (EUA). This EUA will remain in effect (meaning this test can be used) for the duration of the COVID-19 declaration under Section 564(b)(1) of the Act, 21 U.S.C. section 360bbb-3(b)(1), unless the authorization is terminated or revoked.     Resp Syncytial Virus by PCR NEGATIVE NEGATIVE Final    Comment: (NOTE) Fact Sheet for Patients: bloggercourse.com  Fact Sheet for Healthcare Providers: seriousbroker.it  This test is not yet approved or cleared by the [place]  FDA and has been authorized for detection and/or diagnosis of SARS-CoV-2 by FDA under an Emergency Use Authorization (EUA). This EUA will remain in effect (meaning this test can be used) for the duration of the COVID-19 declaration under Section 564(b)(1) of the Act, 21 U.S.C. section 360bbb-3(b)(1), unless the authorization is terminated or revoked.  Performed at [facility], [address] [place], [place] [address]   Blood  Culture (routine x 2)     Status: Abnormal   Collection Time: 01/26/24 12:56 PM   Specimen: BLOOD  Result Value Ref Range Status   Specimen Description   Final    BLOOD BLOOD RIGHT FOREARM Performed at [facility], [address] [place], [place] [address]    Special Requests   Final    BOTTLES DRAWN AEROBIC AND ANAEROBIC Blood Culture results may not be optimal due to an inadequate volume of blood received in culture bottles Performed at [facility], [address] [place], [place] [address]    Culture  Setup Time   Final    GRAM NEGATIVE RODS AEROBIC BOTTLE ONLY CRITICAL VALUE NOTED.  VALUE IS CONSISTENT WITH PREVIOUSLY REPORTED AND CALLED VALUE. Performed at [facility], [address] [place], [place] [address]    Culture (A)  Final    PSEUDOMONAS AERUGINOSA SUSCEPTIBILITIES PERFORMED ON PREVIOUS CULTURE WITHIN THE LAST 5 DAYS. Performed at [facility], [address] [place], [place] [address]    Report Status 01/29/2024 FINAL  Final  Blood Culture (routine x 2)     Status: Abnormal   Collection Time: 01/26/24  1:01 PM   Specimen: BLOOD  Result Value Ref Range Status   Specimen Description   Final    BLOOD RIGHT ARM Performed at [facility], [address] [place], [place] [address]    Special Requests   Final    BOTTLES DRAWN AEROBIC AND ANAEROBIC Blood Culture adequate volume Performed at [facility], [address] [place], [place] [address]    Culture  Setup Time   Final    GRAM NEGATIVE RODS AEROBIC BOTTLE ONLY CRITICAL RESULT CALLED TO, READ BACK BY AND VERIFIED WITH:  NATHAN BELUE AT 0541 01/27/24 JG Performed at [facility], [address] [place], [place] [address]    Culture PSEUDOMONAS AERUGINOSA (A)  Final   Report Status 01/29/2024 FINAL  Final   Organism ID, Bacteria PSEUDOMONAS AERUGINOSA  Final      Susceptibility   Pseudomonas aeruginosa - MIC*    MEROPENEM INTERMEDIATE Intermediate     CIPROFLOXACIN 0.5 SENSITIVE Sensitive     IMIPENEM >=16 RESISTANT Resistant     PIP/TAZO Value in next row Sensitive      8 SENSITIVEThis is a modified FDA-approved test that has been validated and its performance characteristics determined by the reporting laboratory.  This laboratory is certified under the Clinical Laboratory Improvement Amendments CLIA as qualified to perform high complexity clinical laboratory testing.    CEFEPIME  Value in next row Sensitive      8 SENSITIVEThis is a modified FDA-approved test that has been validated and its performance characteristics determined by the reporting laboratory.  This laboratory is certified under the Clinical Laboratory Improvement  Amendments CLIA as qualified to perform high complexity clinical laboratory testing.    CEFTAZIDIME /AVIBACTAM Value in next row Sensitive      8 SENSITIVEThis is a modified FDA-approved test that has been validated and its performance characteristics determined by the reporting laboratory.  This laboratory is certified under the Clinical Laboratory Improvement Amendments CLIA as qualified to perform high complexity clinical laboratory testing.    CEFTOLOZANE/TAZOBACTAM Value in next row Sensitive      8 SENSITIVEThis is a modified FDA-approved test that has been validated and its performance characteristics determined by the reporting laboratory.  This laboratory is certified under the Clinical Laboratory Improvement Amendments CLIA as qualified to  perform high complexity clinical laboratory testing.    TOBRAMYCIN Value in next row Sensitive      8 SENSITIVEThis is a modified FDA-approved test that has been validated and its performance characteristics determined by the reporting laboratory.  This laboratory is certified under the Clinical Laboratory Improvement Amendments CLIA as qualified to perform high complexity clinical laboratory testing.    CEFTAZIDIME  Value in next row Sensitive      8 SENSITIVEThis is a modified FDA-approved test that has been validated and its performance characteristics determined by the reporting laboratory.  This laboratory is certified under the Clinical Laboratory Improvement Amendments CLIA as qualified to perform high complexity clinical laboratory testing.    * PSEUDOMONAS AERUGINOSA  Blood Culture ID Panel (Reflexed)     Status: Abnormal   Collection Time: 01/26/24  1:01 PM  Result Value Ref Range Status   Enterococcus faecalis NOT DETECTED NOT DETECTED Final   Enterococcus Faecium NOT DETECTED NOT DETECTED Final   Listeria monocytogenes NOT DETECTED NOT DETECTED Final   Staphylococcus species NOT DETECTED NOT DETECTED Final   Staphylococcus aureus (BCID) NOT  DETECTED NOT DETECTED Final   Staphylococcus epidermidis NOT DETECTED NOT DETECTED Final   Staphylococcus lugdunensis NOT DETECTED NOT DETECTED Final   Streptococcus species NOT DETECTED NOT DETECTED Final   Streptococcus agalactiae NOT DETECTED NOT DETECTED Final   Streptococcus pneumoniae NOT DETECTED NOT DETECTED Final   Streptococcus pyogenes NOT DETECTED NOT DETECTED Final   A.calcoaceticus-baumannii NOT DETECTED NOT DETECTED Final   Bacteroides fragilis NOT DETECTED NOT DETECTED Final   Enterobacterales NOT DETECTED NOT DETECTED Final   Enterobacter cloacae complex NOT DETECTED NOT DETECTED Final   Escherichia coli NOT DETECTED NOT DETECTED Final   Klebsiella aerogenes NOT DETECTED NOT DETECTED Final   Klebsiella oxytoca NOT DETECTED NOT DETECTED Final   Klebsiella pneumoniae NOT DETECTED NOT DETECTED Final   Proteus species NOT DETECTED NOT DETECTED Final   Salmonella species NOT DETECTED NOT DETECTED Final   Serratia marcescens NOT DETECTED NOT DETECTED Final   Haemophilus influenzae NOT DETECTED NOT DETECTED Final   Neisseria meningitidis NOT DETECTED NOT DETECTED Final   Pseudomonas aeruginosa DETECTED (A) NOT DETECTED Final    Comment: CRITICAL RESULT CALLED TO, READ BACK BY AND VERIFIED WITH:  NATHAN BELUE AT 0541 01/27/24 JG    Stenotrophomonas maltophilia NOT DETECTED NOT DETECTED Final   Candida albicans NOT DETECTED NOT DETECTED Final   Candida auris NOT DETECTED NOT DETECTED Final   Candida glabrata NOT DETECTED NOT DETECTED Final   Candida krusei NOT DETECTED NOT DETECTED Final   Candida parapsilosis NOT DETECTED NOT DETECTED Final   Candida tropicalis NOT DETECTED NOT DETECTED Final   Cryptococcus neoformans/gattii NOT DETECTED NOT DETECTED Final   CTX-M ESBL NOT DETECTED NOT DETECTED Final   Carbapenem resistance IMP NOT DETECTED NOT DETECTED Final   Carbapenem resistance KPC NOT DETECTED NOT DETECTED Final   Carbapenem resistance NDM NOT DETECTED NOT DETECTED  Final   Carbapenem resistance VIM NOT DETECTED NOT DETECTED Final    Comment: Performed at [facility], [address] [place], [place] [address]  Culture, blood (Routine X 2) w Reflex to ID Panel     Status: None   Collection Time: 01/29/24  4:38 AM   Specimen: BLOOD  Result Value Ref Range Status   Specimen Description BLOOD BLOOD LEFT ARM  Final   Special Requests   Final    BOTTLES DRAWN AEROBIC AND ANAEROBIC Blood Culture adequate volume  Culture   Final    NO GROWTH 5 DAYS Performed at [facility], [address] [place], [place] [address]    Report Status 02/03/2024 FINAL  Final  Culture, blood (Routine X 2) w Reflex to ID Panel     Status: None   Collection Time: 01/29/24  4:43 AM   Specimen: BLOOD  Result Value Ref Range Status   Specimen Description BLOOD BLOOD LEFT HAND  Final   Special Requests   Final    BOTTLES DRAWN AEROBIC AND ANAEROBIC Blood Culture adequate volume   Culture   Final    NO GROWTH 5 DAYS Performed at [facility], [address] [place], [place] [address]    Report Status 02/03/2024 FINAL  Final    Labs: CBC: Recent Labs  Lab 02/02/24 0353 02/03/24 0415 02/04/24 0319 02/05/24 0352 02/06/24 0425  WBC 7.3 6.7 7.2 6.8 7.3  NEUTROABS  --   --  5.0 4.9 5.2  HGB 9.8* 10.2* 9.6* 9.3* 9.4*  HCT 33.0* 33.7* 31.5* 30.2* 30.9*  MCV 94.3 93.4 91.3 91.0 91.2  PLT 288 300 290 282 281   Basic Metabolic Panel: Recent Labs  Lab 02/04/24 0319 02/05/24 0352 02/06/24 0425 02/07/24 0440 02/08/24 0311  NA 135 130* 131* 132* 132*  K 4.8 4.9 4.9 4.8 5.2*  CL 86* 84* 86* 88* 89*  CO2 37* 34* 34* 35* 35*  GLUCOSE 110* 113* 113* 100* 103*  BUN 84* 94* 93* 101* 101*  CREATININE 3.61* 3.55* 3.27* 2.92* 2.73*  CALCIUM  9.5 9.2 9.4 9.5 9.4   Liver Function Tests: No results for input(s): AST, ALT, ALKPHOS, BILITOT, PROT, ALBUMIN  in the last 168 hours. CBG: Recent Labs  Lab 02/07/24 0715 02/07/24 1242  02/07/24 1625 02/07/24 2013 02/08/24 0828  GLUCAP 114* 122* 158* 187* 103*    Discharge time spent:  38 minutes.  Signed: Drue ONEIDA Potter, MD [facility] 02/08/2024

## 2024-02-08 NOTE — Progress Notes (Signed)
 Occupational Therapy Treatment Patient Details Name: JESUSITA JOCELYN MRN: [id number] DOB: 21-Jan-1941 Today's Date: 02/08/2024   History of present illness MADIE CAHN is a 83 y.o. 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 She was placed on BiPAP and uses 4L at baseline.   OT comments  Patient seen for OT treatment on this date. Upon arrival to room patient resting in bed, agreeable to treatment. Patient performed bed mobility with increased time/assist and min-mod A x 2; patient performed sit<>stand and SPT from EOB to recliner with min-mod A x 2.  Patient ended treatment in recliner with bed/chair alarm on and all needs within reach. Patient making good progress toward goals, will continue to follow POC. Discharge recommendation remains appropriate.        If plan is discharge home, recommend the following:  Two people to help with walking and/or transfers;Two people to help with bathing/dressing/bathroom;Direct supervision/assist for medications management;Supervision due to cognitive status;Assist for transportation;Assistance with cooking/housework;Help with stairs or ramp for entrance;Assistance with feeding;Direct supervision/assist for financial management   Equipment Recommendations  Other (comment)    Recommendations for Other Services      Precautions / Restrictions Precautions Precautions: Fall Recall of Precautions/Restrictions: Intact Restrictions Weight Bearing Restrictions Per Provider Order: No       Mobility Bed Mobility Overal bed mobility: Needs Assistance Bed Mobility: Supine to Sit     Supine to sit: Min assist, +2 for physical assistance, HOB elevated, Used rails          Transfers Overall  transfer level: Needs assistance Equipment used: Rolling walker (2 wheels) Transfers: Sit to/from Stand, Bed to chair/wheelchair/BSC Sit to Stand: +2 physical assistance, Mod assist, Min assist     Step pivot transfers: Min assist, +2 physical assistance, +2 safety/equipment, Mod assist     General transfer comment: patient performed SPT from EOB to recliner with min/mod A x 2     Balance                                           ADL either performed or assessed with clinical judgement   ADL                                              Extremity/Trunk Assessment              Vision       Perception     Praxis     Communication Communication Communication: Impaired Factors Affecting Communication: Reduced clarity of speech   Cognition Arousal: Alert Behavior During Therapy: WFL for tasks assessed/performed Cognition: No apparent impairments                               Following commands: Intact        Cueing   Cueing Techniques: Verbal cues  Exercises      Shoulder Instructions       General Comments      Pertinent  Vitals/ Pain       Pain Assessment Pain Assessment: No/denies pain  Home Living                                          Prior Functioning/Environment              Frequency  Min 2X/week        Progress Toward Goals  OT Goals(current goals can now be found in the care plan section)  Progress towards OT goals: Progressing toward goals  Acute Rehab OT Goals Patient Stated Goal: to get better OT Goal Formulation: With patient Time For Goal Achievement: 02/12/24 Potential to Achieve Goals: Fair ADL Goals Pt Will Perform Grooming: sitting;with set-up;with supervision Pt Will Transfer to Toilet: stand pivot transfer;bedside commode;with +2 assist;with mod assist Pt Will Perform Toileting - Clothing Manipulation and hygiene: sit to/from stand;with mod  assist  Plan      Co-evaluation    PT/OT/SLP Co-Evaluation/Treatment: Yes Reason for Co-Treatment: Complexity of the patient's impairments (multi-system involvement) PT goals addressed during session: Mobility/safety with mobility OT goals addressed during session: ADL's and self-care      AM-PAC OT 6 Clicks Daily Activity     Outcome Measure   Help from another person eating meals?: A Lot Help from another person taking care of personal grooming?: A Lot Help from another person toileting, which includes using toliet, bedpan, or urinal?: Total Help from another person bathing (including washing, rinsing, drying)?: Total Help from another person to put on and taking off regular upper body clothing?: Total Help from another person to put on and taking off regular lower body clothing?: Total 6 Click Score: 8    End of Session Equipment Utilized During Treatment: Rolling walker (2 wheels);Oxygen  OT Visit Diagnosis: Muscle weakness (generalized) (M62.81);Pain;Other abnormalities of gait and mobility (R26.89) Pain - Right/Left: Left Pain - part of body: Leg   Activity Tolerance Patient tolerated treatment well   Patient Left in chair;with call bell/phone within reach   Nurse Communication Mobility status        Time: 8870-8843 OT Time Calculation (min): 27 min  Charges: OT General Charges $OT Visit: 1 Visit OT Treatments $Self Care/Home Management : 8-22 mins  Rogers Clause, OT/L [facility], 02/08/2024   Maryelizabeth CHRISTELLA Clause 02/08/2024, 12:04 PM

## 2024-02-08 NOTE — Plan of Care (Signed)
  Problem: Fluid Volume: Goal: Hemodynamic stability will improve Outcome: Progressing   Problem: Clinical Measurements: Goal: Diagnostic test results will improve Outcome: Progressing Goal: Signs and symptoms of infection will decrease Outcome: Progressing   Problem: Respiratory: Goal: Ability to maintain adequate ventilation will improve Outcome: Progressing   Problem: Education: Goal: Knowledge of General Education information will improve Description: Including pain rating scale, medication(s)/side effects and non-pharmacologic comfort measures Outcome: Progressing   Problem: Health Behavior/Discharge Planning: Goal: Ability to manage health-related needs will improve Outcome: Progressing   Problem: Clinical Measurements: Goal: Ability to maintain clinical measurements within normal limits will improve Outcome: Progressing Goal: Will remain free from infection Outcome: Progressing Goal: Diagnostic test results will improve Outcome: Progressing Goal: Respiratory complications will improve Outcome: Progressing Goal: Cardiovascular complication will be avoided Outcome: Progressing   Problem: Activity: Goal: Risk for activity intolerance will decrease Outcome: Progressing   Problem: Nutrition: Goal: Adequate nutrition will be maintained Outcome: Progressing   Problem: Coping: Goal: Level of anxiety will decrease Outcome: Progressing   Problem: Elimination: Goal: Will not experience complications related to bowel motility Outcome: Progressing Goal: Will not experience complications related to urinary retention Outcome: Progressing   Problem: Pain Managment: Goal: General experience of comfort will improve and/or be controlled Outcome: Progressing   Problem: Safety: Goal: Ability to remain free from injury will improve Outcome: Progressing   Problem: Skin Integrity: Goal: Risk for impaired skin integrity will decrease Outcome: Progressing   Problem:  Clinical Measurements: Goal: Ability to avoid or minimize complications of infection will improve Outcome: Progressing   Problem: Skin Integrity: Goal: Skin integrity will improve Outcome: Progressing

## 2024-02-24 LAB — CULTURE, BLOOD (ROUTINE X 2): Special Requests: ADEQUATE

## 2024-03-21 DIAGNOSIS — I890 Lymphedema, not elsewhere classified: Secondary | ICD-10-CM | POA: Insufficient documentation

## 2024-03-24 ENCOUNTER — Ambulatory Visit: Admitting: Podiatry

## 2024-03-24 ENCOUNTER — Encounter: Payer: Self-pay | Admitting: Podiatry

## 2024-03-24 DIAGNOSIS — L84 Corns and callosities: Secondary | ICD-10-CM

## 2024-03-24 DIAGNOSIS — B351 Tinea unguium: Secondary | ICD-10-CM

## 2024-03-24 DIAGNOSIS — M79676 Pain in unspecified toe(s): Secondary | ICD-10-CM

## 2024-03-24 DIAGNOSIS — E1142 Type 2 diabetes mellitus with diabetic polyneuropathy: Secondary | ICD-10-CM

## 2024-03-31 NOTE — Progress Notes (Signed)
"  °  Subjective:  Patient ID: Kelly Hogan, female    DOB: February 08, 1941,  MRN: [id number]  Kelly Hogan presents to clinic today for painful mycotic toenails of both feet that are difficult to trim. Pain interferes with daily activities and wearing enclosed shoe gear comfortably. She is accompanied by her son on today's visit. Chief Complaint  Patient presents with   Nail Problem    Thick painful toenails, 3 month follow up    Diabetes   Foot Swelling    Lymphedema bilateral lower limbs   New problem(s): None.   PCP is [name] [name] LABOR, MD. Kelly Hogan 03/21/24.  Allergies[1]  Review of Systems: Negative except as noted in the HPI.  Objective: There were no vitals filed for this visit. Kelly Hogan is a pleasant 84 y.o. female morbidly obese in NAD. AAO x 3. On  supplemental oxygen.  Vascular Examination: CFT <3 seconds b/l. DP/PT pulses faintly palpable b/l. Digital hair absent.  Skin temperature gradient warm to warm b/l. No pain with calf compression. No ischemia or gangrene. No cyanosis or clubbing noted b/l. Lymphedema present BLE.   Neurological Examination: Sensation grossly intact b/l with 10 gram monofilament. Vibratory sensation intact b/l. Pt has subjective symptoms of neuropathy.  Dermatological Examination: Pedal skin warm and supple b/l.   No open wounds. No interdigital macerations.  Preulcerative lesion noted distal tip of right 4th toe. There is visible subdermal hemorrhage. There is no surrounding erythema, no edema, no drainage, no odor, no fluctuance.  Toenails 1-5 b/l thick, discolored, elongated with subungual debris and pain on dorsal palpation.    Musculoskeletal Examination: Muscle strength 5/5 to all lower extremity muscle groups bilaterally. Pes planus deformity noted bilateral LE. Utilizes wheelchair for mobility assistance.  Radiographs: None  Assessment/Plan: 1. Pain due to onychomycosis of toenail   2. Pre-ulcerative corn or callous   3.  Diabetic peripheral neuropathy associated with type 2 diabetes mellitus [facility])   Consent given for treatment. Patient examined. All patient's and/or POA's questions/concerns addressed on today's visit. Mycotic toenails 1-5 left, 1, 2, 3, 5 right debrided in length and girth without incident. Preulcerative lesion(s) distal tip of right 4th toe pared with sharp debridement without incident.Continue daily foot inspections and monitor blood glucose per PCP/Endocrinologist's recommendations. Continue soft, supportive shoe gear daily. Report any pedal injuries to medical professional. Call office if there are any quesitons/concerns.  Return in about 3 months (around 06/22/2024).  Kelly Hogan, DPM      [place] LOCATION: [address] [place] [address]                   Office [phone]   [place] LOCATION: [address] [place], [place] [address] Office [phone]     [1]  Allergies Allergen Reactions   Enalapril Swelling    angioedema   Oxycodone Shortness Of Breath   Oxycodone-Acetaminophen  Shortness Of Breath   Ranitidine Hcl Swelling   Aspirin     Hydrochlorothiazide Rash   Penicillins Rash   "

## 2024-06-24 ENCOUNTER — Ambulatory Visit: Admitting: Podiatry
